# Patient Record
Sex: Female | Born: 1970
Health system: Southern US, Community
[De-identification: ages and names within clinical notes are randomized; demographics above are authoritative.]

## PROBLEM LIST (undated history)

## (undated) DIAGNOSIS — T7840XA Allergy, unspecified, initial encounter: Secondary | ICD-10-CM

## (undated) DIAGNOSIS — G35 Multiple sclerosis: Secondary | ICD-10-CM

## (undated) DIAGNOSIS — T783XXA Angioneurotic edema, initial encounter: Secondary | ICD-10-CM

## (undated) DIAGNOSIS — F419 Anxiety disorder, unspecified: Secondary | ICD-10-CM

## (undated) DIAGNOSIS — F329 Major depressive disorder, single episode, unspecified: Secondary | ICD-10-CM

## (undated) DIAGNOSIS — F32A Depression, unspecified: Secondary | ICD-10-CM

## (undated) DIAGNOSIS — Z8489 Family history of other specified conditions: Secondary | ICD-10-CM

## (undated) DIAGNOSIS — H539 Unspecified visual disturbance: Secondary | ICD-10-CM

## (undated) DIAGNOSIS — G709 Myoneural disorder, unspecified: Secondary | ICD-10-CM

## (undated) DIAGNOSIS — C801 Malignant (primary) neoplasm, unspecified: Secondary | ICD-10-CM

## (undated) HISTORY — DX: Malignant (primary) neoplasm, unspecified: C80.1

## (undated) HISTORY — DX: Depression, unspecified: F32.A

## (undated) HISTORY — PX: ADENOIDECTOMY: SUR15

## (undated) HISTORY — DX: Major depressive disorder, single episode, unspecified: F32.9

## (undated) HISTORY — DX: Anxiety disorder, unspecified: F41.9

## (undated) HISTORY — DX: Unspecified visual disturbance: H53.9

## (undated) HISTORY — DX: Allergy, unspecified, initial encounter: T78.40XA

## (undated) HISTORY — DX: Myoneural disorder, unspecified: G70.9

## (undated) HISTORY — PX: OTHER SURGICAL HISTORY: SHX169

## (undated) HISTORY — DX: Angioneurotic edema, initial encounter: T78.3XXA

## (undated) HISTORY — PX: CHOLECYSTECTOMY: SHX55

## (undated) HISTORY — PX: BUNIONECTOMY: SHX129

---

## 1999-05-05 ENCOUNTER — Emergency Department (HOSPITAL_COMMUNITY): Admission: EM | Admit: 1999-05-05 | Discharge: 1999-05-05 | Payer: Self-pay | Admitting: *Deleted

## 1999-05-29 ENCOUNTER — Inpatient Hospital Stay (HOSPITAL_COMMUNITY): Admission: AD | Admit: 1999-05-29 | Discharge: 1999-05-29 | Payer: Self-pay | Admitting: Obstetrics and Gynecology

## 1999-06-27 ENCOUNTER — Inpatient Hospital Stay (HOSPITAL_COMMUNITY): Admission: AD | Admit: 1999-06-27 | Discharge: 1999-06-30 | Payer: Self-pay | Admitting: Obstetrics and Gynecology

## 1999-08-14 ENCOUNTER — Observation Stay (HOSPITAL_COMMUNITY): Admission: AD | Admit: 1999-08-14 | Discharge: 1999-08-15 | Payer: Self-pay | Admitting: Obstetrics and Gynecology

## 1999-08-15 ENCOUNTER — Encounter: Payer: Self-pay | Admitting: *Deleted

## 1999-08-20 ENCOUNTER — Inpatient Hospital Stay (HOSPITAL_COMMUNITY): Admission: AD | Admit: 1999-08-20 | Discharge: 1999-08-23 | Payer: Self-pay | Admitting: Obstetrics and Gynecology

## 1999-08-20 ENCOUNTER — Encounter: Payer: Self-pay | Admitting: Obstetrics and Gynecology

## 1999-08-21 ENCOUNTER — Encounter: Payer: Self-pay | Admitting: Obstetrics and Gynecology

## 1999-08-23 ENCOUNTER — Encounter: Payer: Self-pay | Admitting: Obstetrics and Gynecology

## 1999-08-29 ENCOUNTER — Ambulatory Visit (HOSPITAL_COMMUNITY): Admission: RE | Admit: 1999-08-29 | Discharge: 1999-08-29 | Payer: Self-pay | Admitting: Obstetrics and Gynecology

## 1999-09-05 ENCOUNTER — Encounter: Payer: Self-pay | Admitting: Obstetrics and Gynecology

## 1999-09-05 ENCOUNTER — Ambulatory Visit (HOSPITAL_COMMUNITY): Admission: RE | Admit: 1999-09-05 | Discharge: 1999-09-05 | Payer: Self-pay | Admitting: Obstetrics and Gynecology

## 1999-09-06 ENCOUNTER — Inpatient Hospital Stay (HOSPITAL_COMMUNITY): Admission: AD | Admit: 1999-09-06 | Discharge: 1999-09-06 | Payer: Self-pay | Admitting: Obstetrics and Gynecology

## 1999-09-13 ENCOUNTER — Ambulatory Visit (HOSPITAL_COMMUNITY): Admission: AD | Admit: 1999-09-13 | Discharge: 1999-09-13 | Payer: Self-pay | Admitting: Obstetrics and Gynecology

## 1999-09-22 ENCOUNTER — Inpatient Hospital Stay (HOSPITAL_COMMUNITY): Admission: AD | Admit: 1999-09-22 | Discharge: 1999-09-25 | Payer: Self-pay | Admitting: Obstetrics and Gynecology

## 1999-09-22 ENCOUNTER — Encounter (INDEPENDENT_AMBULATORY_CARE_PROVIDER_SITE_OTHER): Payer: Self-pay | Admitting: Specialist

## 1999-11-14 ENCOUNTER — Other Ambulatory Visit: Admission: RE | Admit: 1999-11-14 | Discharge: 1999-11-14 | Payer: Self-pay | Admitting: Obstetrics and Gynecology

## 2000-10-14 ENCOUNTER — Encounter: Admission: RE | Admit: 2000-10-14 | Discharge: 2000-11-17 | Payer: Self-pay | Admitting: Orthopaedic Surgery

## 2001-07-21 ENCOUNTER — Other Ambulatory Visit: Admission: RE | Admit: 2001-07-21 | Discharge: 2001-07-21 | Payer: Self-pay | Admitting: Obstetrics and Gynecology

## 2002-04-26 ENCOUNTER — Encounter: Payer: Self-pay | Admitting: Internal Medicine

## 2002-04-26 ENCOUNTER — Ambulatory Visit (HOSPITAL_COMMUNITY): Admission: RE | Admit: 2002-04-26 | Discharge: 2002-04-27 | Payer: Self-pay | Admitting: Neurology

## 2002-04-26 ENCOUNTER — Encounter: Payer: Self-pay | Admitting: Cardiology

## 2003-04-27 ENCOUNTER — Other Ambulatory Visit: Admission: RE | Admit: 2003-04-27 | Discharge: 2003-04-27 | Payer: Self-pay | Admitting: Obstetrics and Gynecology

## 2004-04-30 ENCOUNTER — Other Ambulatory Visit: Admission: RE | Admit: 2004-04-30 | Discharge: 2004-04-30 | Payer: Self-pay | Admitting: Obstetrics and Gynecology

## 2004-06-11 ENCOUNTER — Encounter: Admission: RE | Admit: 2004-06-11 | Discharge: 2004-06-11 | Payer: Self-pay | Admitting: Obstetrics and Gynecology

## 2005-08-25 ENCOUNTER — Other Ambulatory Visit: Admission: RE | Admit: 2005-08-25 | Discharge: 2005-08-25 | Payer: Self-pay | Admitting: Obstetrics and Gynecology

## 2006-11-19 ENCOUNTER — Ambulatory Visit (HOSPITAL_COMMUNITY): Admission: RE | Admit: 2006-11-19 | Discharge: 2006-11-19 | Payer: Self-pay | Admitting: Gastroenterology

## 2006-11-27 ENCOUNTER — Inpatient Hospital Stay (HOSPITAL_COMMUNITY): Admission: EM | Admit: 2006-11-27 | Discharge: 2006-11-28 | Payer: Self-pay | Admitting: Emergency Medicine

## 2006-11-27 ENCOUNTER — Encounter (INDEPENDENT_AMBULATORY_CARE_PROVIDER_SITE_OTHER): Payer: Self-pay | Admitting: *Deleted

## 2007-06-07 ENCOUNTER — Encounter: Admission: RE | Admit: 2007-06-07 | Discharge: 2007-06-07 | Payer: Self-pay | Admitting: Internal Medicine

## 2007-07-29 ENCOUNTER — Encounter: Admission: RE | Admit: 2007-07-29 | Discharge: 2007-07-29 | Payer: Self-pay | Admitting: Obstetrics and Gynecology

## 2007-12-18 ENCOUNTER — Emergency Department (HOSPITAL_COMMUNITY): Admission: EM | Admit: 2007-12-18 | Discharge: 2007-12-18 | Payer: Self-pay | Admitting: Emergency Medicine

## 2007-12-22 ENCOUNTER — Ambulatory Visit (HOSPITAL_COMMUNITY): Admission: RE | Admit: 2007-12-22 | Discharge: 2007-12-22 | Payer: Self-pay | Admitting: Neurology

## 2008-05-10 ENCOUNTER — Ambulatory Visit (HOSPITAL_COMMUNITY): Admission: RE | Admit: 2008-05-10 | Discharge: 2008-05-10 | Payer: Self-pay | Admitting: Neurology

## 2008-05-20 ENCOUNTER — Ambulatory Visit (HOSPITAL_COMMUNITY): Admission: RE | Admit: 2008-05-20 | Discharge: 2008-05-20 | Payer: Self-pay | Admitting: Neurology

## 2008-07-24 ENCOUNTER — Ambulatory Visit (HOSPITAL_COMMUNITY): Admission: RE | Admit: 2008-07-24 | Discharge: 2008-07-24 | Payer: Self-pay | Admitting: Neurology

## 2008-07-25 ENCOUNTER — Encounter: Admission: RE | Admit: 2008-07-25 | Discharge: 2008-07-25 | Payer: Self-pay | Admitting: Neurology

## 2008-07-27 ENCOUNTER — Encounter: Admission: RE | Admit: 2008-07-27 | Discharge: 2008-07-27 | Payer: Self-pay | Admitting: Neurology

## 2008-08-16 ENCOUNTER — Ambulatory Visit (HOSPITAL_COMMUNITY): Admission: RE | Admit: 2008-08-16 | Discharge: 2008-08-16 | Payer: Self-pay | Admitting: Neurology

## 2009-01-01 ENCOUNTER — Ambulatory Visit: Payer: Self-pay | Admitting: Vascular Surgery

## 2009-01-11 ENCOUNTER — Ambulatory Visit: Payer: Self-pay | Admitting: Vascular Surgery

## 2009-09-24 ENCOUNTER — Ambulatory Visit (HOSPITAL_COMMUNITY): Admission: RE | Admit: 2009-09-24 | Discharge: 2009-09-24 | Payer: Self-pay | Admitting: Obstetrics and Gynecology

## 2009-10-23 ENCOUNTER — Emergency Department (HOSPITAL_COMMUNITY): Admission: EM | Admit: 2009-10-23 | Discharge: 2009-10-23 | Payer: Self-pay | Admitting: Family Medicine

## 2010-09-29 ENCOUNTER — Encounter: Payer: Self-pay | Admitting: Gastroenterology

## 2010-09-30 ENCOUNTER — Encounter: Payer: Self-pay | Admitting: Neurology

## 2010-11-24 LAB — CBC
Hemoglobin: 17.1 g/dL — ABNORMAL HIGH (ref 12.0–15.0)
MCHC: 34.3 g/dL (ref 30.0–36.0)
RBC: 5.56 MIL/uL — ABNORMAL HIGH (ref 3.87–5.11)

## 2010-11-24 LAB — PREGNANCY, URINE: Preg Test, Ur: NEGATIVE

## 2011-01-21 NOTE — Consult Note (Signed)
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  This has developed over the last few  years and results in some stinging and burning discomfort, not  associated by any distal edema or history of deep venous thrombosis,  thrombophlebitis, bleeding, etc.  She has not had previous treatment or  evaluation.  She is unable to relieve the discomfort, it is very  transient.   PAST HISTORY:  Negative for diabetes, hypertension, coronary artery  disease, COPD or stroke.  She has had a cardiac arrhythmia treated with  ablation by Dr. Graciela Husbands and is being followed for possible MS by Dr. Melbourne Abts.   PAST SURGICAL HISTORY:  1. Cholecystectomy.  2. Bunionectomy.  3. C-section.   FAMILY HISTORY:  Negative for coronary artery disease, diabetes or  stroke.   SOCIAL HISTORY:  She is single, has 2 children, works as a Engineer, civil (consulting).  Has  not smoked in 20 years.  Does not use alcohol on a regular basis.   ALLERGIES:  Stadol, Demerol, Augmentin, and Neurontin.   PHYSICAL EXAMINATION:  Vital Signs:  Blood pressure 131/84, heart rate  is 81, respirations 14.  General:  A healthy-appearing middle-aged  female in no apparent distress, alert and oriented x3.  Neck:  Supple,  3+ carotid pulses palpable.  No bruits are audible.  Neurologic:  Grossly normal.  No palpable adenopathy in the neck.  Chest:  Clear to  auscultation.  Cardiovascular:  Regular rhythm, no murmurs.  Abdomen:  Soft, nontender with no masses.  Three plus femoral, popliteal,  posterior tibial and dorsalis pedis pulses palpable bilaterally.  She  has some spider veins in the left anterior thigh medially and laterally  and in the left lateral calf.  No varicosities significantly noted  either leg.  There is no distal edema,  hyperpigmentation or ulceration.   Venous duplex exam was performed, revealed no evidence of any deep  venous obstruction or reflux and the left great saphenous vein was  essentially normal with the exception of a very mild amount of reflux at  the junction.   I reassured her regarding these findings, recommended primary  sclerotherapy for her venous disease and we will schedule that in the  near future at her request.   Quita Skye. Hart Rochester, M.D.  Electronically Signed   JDL/MEDQ  D:  01/01/2009  T:  01/02/2009  Job:  [id number]

## 2011-01-21 NOTE — Procedures (Signed)
LOWER EXTREMITY VENOUS REFLUX EXAM   INDICATION:  Left leg telangiectasia.   EXAM:  Using color-flow imaging and pulse Doppler spectral analysis, the  left common femoral, superficial femoral, popliteal, posterior tibial,  greater and lesser saphenous veins are evaluated.  There is evidence  suggesting deep venous insufficiency in the left common femoral vein  only.   The left saphenofemoral junction is mildly incompetent.  The left GSV is  mildly incompetent from the proximal to mid thigh.   The left proximal short saphenous vein demonstrates competency.   GSV Diameter (used if found to be incompetent only)                                            Right    Left  Proximal Greater Saphenous Vein           cm       0.23 cm  Proximal-to-mid-thigh                     cm       0.22 cm  Mid thigh                                 cm       0.21 cm  Mid-distal thigh                          cm    0.20 cm  Distal thigh                              cm       cm  Knee                                      cm       cm   IMPRESSION:  1. Mild left greater saphenous vein reflux is identified with the      caliber ranging from 0.20 cm to 0.23 cm from the groin to the      distal thigh.  2. The left greater saphenous vein is not aneurysmal.  3. The left greater saphenous vein is not tortuous.  4. The deep venous system is mildly incompetent in the left common      femoral vein only.  5. The left lesser saphenous vein is competent.   ___________________________________________  [name] [name]. Hart Rochester, M.D.   MC/MEDQ  D:  01/01/2009  T:  01/01/2009  Job:  [id number]

## 2011-01-24 NOTE — Discharge Summary (Signed)
[facility]  Patient:    [name] [name]                          MRN: [id number] Adm. Date:  [id number] Disc. Date: 06/30/99 Attending:  Frederich Balding Dictator:   Danie Chandler, R.N.                           Discharge Summary  ADMITTING DIAGNOSIS:          Preterm labor at [redacted] weeks gestation.  DISCHARGE DIAGNOSIS:          Preterm labor at [redacted] weeks gestation.  PROCEDURE:                    Magnesium sulfate tocolysis.  REASON FOR ADMISSION:         The patient is a 41 year old gravida 2 para 1 married white female with an estimated date of confinement of October 18, 1999, placing her at approximately 24 weeks estimated gestational age.  The patient was admitted or tocolytics for preterm uterine activity.  The patient was seen in the office for increasing uterine activity, and her cervix was noted to be closed but 50% effaced with a vertex at about a -1 station.  She was sent to triage, where monitoring id reveal increasing uterine irritability, and due to the cervical changes, she was given subcutaneous terbutaline.  There was no resolution of the uterine activity, and thus she was admitted for magnesium sulfate tocolysis.  HOSPITAL COURSE:              The patient is admitted and does receive magnesium sulfate tocolysis.  She is also given IV Unasyn for concern about group B beta strep status, because this is still pending.  On hospitalization day #1, the patient was stable on magnesium sulfate and IV antibiotics, and magnesium sulfate was decreased.  On hospitalization day #2, the patient was having minimal contractions.  She had had good results with the magnesium sulfate, and she was  discharged home on the terbutaline pump.  CONDITION ON DISCHARGE:       Good.  DIET:                         Regular as tolerated.  ACTIVITY:                     Bedrest with bathroom privileges.  INSTRUCTIONS:                 She is to call  for any signs or symptoms of increasing preterm labor.  DISCHARGE MEDICATIONS:        1. Terbutaline pump.                               2. Prenatal vitamin 1 p.o. q.d.  FOLLOW-UP:                    She is to follow up in the office on July 01, 1999. DD:  08/14/99 TD:  08/15/99 Job: [id number]

## 2011-01-24 NOTE — Discharge Summary (Signed)
[name] [name], [name] [name]                ACCOUNT NO.:  [id number]   MEDICAL RECORD NO.:  [id number]          PATIENT TYPE:  INP   LOCATION:  5710                         FACILITY:  [facility]   PHYSICIAN:  Thomas A. Cornett, M.D.DATE OF BIRTH:  1971/03/03   DATE OF ADMISSION:  11/27/2006  DATE OF DISCHARGE:  11/28/2006                               DISCHARGE SUMMARY   ADMITTING DIAGNOSIS:  Biliary dyskinesia.   DISCHARGE DIAGNOSIS:  Biliary dyskinesia.   PROCEDURE PERFORMED:  Laparoscopic cholecystectomy with cholangiogram.   BRIEF HISTORY:  The patient is a 40 year old female that was seen due to  abdominal pain.  She had evidence of biliary dyskinesia.  She was taken  to the operating room on November 27, 2006, for laparoscopic  cholecystectomy with cholangiogram with Dr. [name].  She tolerated this  well.  She is discharged home on November 28, 2006, in improved condition.   DISCHARGE INSTRUCTIONS:  She will follow up with Dr. [name] in one to  two weeks.  She will be given Vicodin for pain as needed.  She will  resume her preoperative medications as before.  Her diet will be as  before.   CONDITION ON DISCHARGE:  Improved.      Thomas A. Cornett, M.D.  Electronically Signed     TAC/MEDQ  D:  01/20/2007  T:  01/20/2007  Job:  [id number]

## 2011-01-24 NOTE — Consult Note (Signed)
NAME:  [name] [name], [name] [name] NO.:  [id number]   MEDICAL RECORD NO.:  [id number]          PATIENT TYPE:  EMS   LOCATION:  MAJO                         FACILITY:  [facility]   PHYSICIAN:  Georgiana Spinner, M.D.    DATE OF BIRTH:  Jul 11, 1971   DATE OF CONSULTATION:  11/26/2006  DATE OF DISCHARGE:                                 CONSULTATION   PHONE CONVERSATION:  Ms. Fryberger called me November 26, 2006 at approximately 6:30 p.m.  She has  been having a problem with her gallbladder diagnosed with a HIDA scan  and an ultrasound and she was referred to Dr. Johna Sheriff.  She states  today she has had the onset of acute abdominal pain and nausea, which is  worse than she has had previously and I suggested that she would  probably do best by going to the emergency room to be evaluated and  possibly see the surgeon there.           ______________________________  Georgiana Spinner, M.D.     GMO/MEDQ  D:  11/26/2006  T:  11/27/2006  Job:  [id number]   cc:   Loreta Ave, M.D.

## 2011-01-24 NOTE — Op Note (Signed)
NAMEPECOLIA, MARANDO                ACCOUNT NO.:  [id number]   MEDICAL RECORD NO.:  [id number]          PATIENT TYPE:  INP   LOCATION:  5710                         FACILITY:  [facility]   PHYSICIAN:  Thornton Park. [name], MD  DATE OF BIRTH:  May 18, 1971   DATE OF PROCEDURE:  11/27/2006  DATE OF DISCHARGE:                               OPERATIVE REPORT   PREOPERATIVE DIAGNOSIS:  Biliary dyskinesia.   POSTOPERATIVE DIAGNOSIS:  Biliary dyskinesia.   FINDINGS:  A large dilated floppy gallbladder with a long cystic duct  and some sludge.   PROCEDURE:  Laparoscopic cholecystectomy with intraoperative  cholangiogram.   SURGEON:  Thornton Park. [name], M.D.   ASSISTANT:  Sandria Bales. Ezzard Standing, M.D.   ANESTHESIA:  General endotracheal anesthesia.   DESCRIPTION OF PROCEDURE:  [name] [name] was taken to room 16 on Friday,  November 27, 2006, and given general anesthesia.  The abdomen was prepped  with TechniCare and draped sterilely.  A longitudinal incision was made  down into her umbilicus and, using Hassan technique, I entered the  abdomen without difficulty.  The abdomen was insufflated and an 11 mm  was placed in the upper midline and two 5 mm placed laterally on the  right.  As we placed these, Marcaine was used.  The gallbladder was  grasped, elevated and the cystic duct and cystic artery were dissected  free and clips were placed proximally on them.  I then did a dynamic  cholangiogram using a Reddick catheter showing a long cystic tortuous  duct with free flow into the common bile duct, free flow into the  duodenum and proximal intrahepatic filling.  The gallbladder was then  triply clipped and divided and the cystic artery was triply clipped and  divided and the gallbladder was removed from the gallbladder bed without  difficulty.  Once It was detached, it was placed in a bag and brought  out through the umbilicus.  Meanwhile, I looked back in the gallbladder  bed, irrigated, and no bleeding or  bile leaks were noted.  The fluid was  withdrawn.  The umbilical port was repaired with a figure-of-eight  suture of 0 Vicryl under laparoscopic vision.  The abdomen was deflated  and the wounds were closed with 4-0 Vicryl and Steri-Strips.  The  patient tolerated the procedure well and was taken to the recovery room  in satisfactory condition.      Thornton Park [name], MD  Electronically Signed     MBM/MEDQ  D:  11/27/2006  T:  11/27/2006  Job:  [id number]   cc:   [name], M.D.

## 2011-01-24 NOTE — Discharge Summary (Signed)
[name], [name] NO.:  [id number]   MEDICAL RECORD NO.:  [id number]                   PATIENT TYPE:  OIB   LOCATION:  3707                                 FACILITY:  [facility]   PHYSICIAN:  Duke Salvia, M.D. [facility]           DATE OF BIRTH:  [birth year]   DATE OF ADMISSION:  04/26/2002  DATE OF DISCHARGE:  04/27/2002                                 DISCHARGE SUMMARY   HISTORY OF PRESENT ILLNESS:  This is a 40 year old female with a 15 year  history of recurrent abrupt onset of tachy palpitations that are associated  with chest pain, shortness of breath and significant lightheadedness.  Pre-  syncope but no syncope, they are both frog negative and diuretic negative  and are become increasingly frequent and increasingly symptomatic over time.  She notices that they are not related to caffeine as she drinks a couple of  glasses a day.  Primarily related to her menstrual status as well as  pregnancy status.  Apparently had an ultrasound of her heart which was  normal.  She has a mitral valve click and a murmur by her report.   PAST MEDICAL HISTORY:  Noted for left bunion surgery, adenoidectomy and C-  section.   ALLERGIES:  Allergies to Demerol, hives to Stadol.  She does tolerate  morphine.   The patient was admitted for SVT ablation.  On 8/19 the patient underwent a  successful flow pathway modification with no evidence of antegrade flow  pathway.  The patient complains of chest pain and vagal after procedure.  A  STAT echo was performed which showed no evidence of pericardial effusion.  The patient was still complaining of intrascapular pain.  A CAT scan of the  chest and abdomen were obtained, which were reportedly negative.  The  patient gradually had decrease in her symptoms and the following day had  felt well and was discharged to home in stable condition on her previous  Zyrtec 10 mg daily.  She is to take coated aspirin 325 daily for 6 weeks  and  antibiotics prior to any urine, dental, bowel or GYN procedures for the next  3 months.  Tylenol 1 to 2 tabs p.r.n. for pain.  No heavy lifting or  strenuous activity for 4 days, no driving for 2 weeks.  A low fat, low  cholesterol diet.  She is to call for any drainage or if she develops a lump  in her groin and she is scheduled to see myself, Chinita Pester, nurse  practitioner, on September 16th at 2:30 PM at the [facility] office.      Chinita Pester, CRNP [facility]                     Duke Salvia, M.D. [facility]    DS/MEDQ  D:  04/27/2002  T:  04/29/2002  Job:  [id number]   cc:  Duke Salvia, M.D. [facility]   Alfred B. Little, M.D.

## 2011-01-24 NOTE — H&P (Signed)
[name], [name] NO.:  [id number]   MEDICAL RECORD NO.:  [id number]          PATIENT TYPE:  EMS   LOCATION:  MAJO                         FACILITY:  [facility]   PHYSICIAN:  Thornton Park. [name], MD  DATE OF BIRTH:  [birth year]   DATE OF ADMISSION:  11/27/2006  DATE OF DISCHARGE:                              HISTORY & PHYSICAL   ATTENDING PHYSICIAN:  Molli Hazard B. [name], MD, with [facility]  surgery.   PRIMARY CARE PHYSICIAN:  [name] [name]. [name] [name], M.D.   GASTROENTEROLOGIST:  [name], M.D.   CHIEF COMPLAINT:  Abdominal pain.   HISTORY OF PRESENT ILLNESS:  The patient is a 40 year old white female  with known gallbladder ejection fraction of 37% on November 19, 2006, who  presented to the emergency department today with one day history of  intractable right upper quadrant and mid epigastric abdominal pain rated  07/10 radiating to right shoulder and associated with nausea and  nonbilious vomiting x1 this morning.  She had been having intermittent  biliary colic for about 10 days and has been evaluated as an outpatient  by Dr. Loreta Ave.  She had an abdominal ultrasound which was normal on November 19, 2006, but had a HIDA scan with a gallbladder ejection fraction of  37% on the same day.  She was referred to see Dr. Johna [name] as an  outpatient but has not had her appointment yet.  She denies any  jaundice, pruritus, chest pain or shortness of breath.  She did have  some subjective fevers last night.   PAST MEDICAL HISTORY:  1. Paroxysmal atrial tachycardia status post ablation in 2003.  2. Mitral valve prolapse.  3. Acne.  4. Depression.   HOME MEDICATIONS:  1. Zyrtec  2. Wellbutrin 150 mg p.o. daily.  3. Solandrine.   ALLERGIES:  DEMEROL, STADOL, and AUGMENTIN all causes serous reaction  with throat swelling.   FAMILY HISTORY:  Mom and dad both with  hypertension.  Dad did have  hernias repaired at [facility].   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  She is divorced and lives with her mother.  She  denies any alcohol, tobacco or other drug use.   PHYSICAL EXAMINATION:  VITALS:  Temperature 97.1, pulse 90, blood  pressure 126/87, respiratory rate 16, 98% on room air.  GENERAL:  She is alert and oriented x3.  HEENT: Mucous membranes are moist.  No scleral icterus.  Pupils are  equally round and reactive to light and accommodation.  Extraocular  movements intact.  NECK:  Supple, no lymphadenopathy.  CARDIOVASCULAR:  Regular rate and rhythm.  LUNGS:  Clear to auscultation bilaterally.  ABDOMEN:  Positive bowel sounds, soft, very tender to palpation in the  right upper quadrant and mid epigastric region.  EXTREMITIES:  No cyanosis, clubbing or edema.  No leg swelling.  SKIN:  No jaundice.   LABORATORY RESULTS:  Currently pending.   ASSESSMENT:  A 40 year old female with known biliary dyskinesia, now  with possible cholecystitis.  PLAN:  Will plan for laparoscopic cholecystectomy later today.  Will the  patient n.p.o.  Will check a CMET, CBC and coag studies.  Will use IV  morphine and IV Zofran as needed for symptom management.  Will start  ciprofloxacin 400 mg IV q.12h. for antibiotic coverage.      [name] [name], M.D.      Thornton Park [name], MD  Electronically Signed    MR/MEDQ  D:  11/27/2006  T:  11/27/2006  Job:  [id number]

## 2011-01-24 NOTE — Discharge Summary (Signed)
[facility]  Patient:    [name] [name]                          MRN: [id number] Adm. Date:  [id number] Disc. Date: [id number] Attending:  Cordelia Pen Ii Dictator:   Kathi Der, R.N.                           Discharge Summary  ADMITTING DIAGNOSES:          1. Intrauterine pregnancy at 36-1/7 weeks estimated                                  gestational age.                               2. Breech presentation.                               3. Oligohydramnios.                               4. Labor.  DISCHARGE DIAGNOSES:          1. Intrauterine pregnancy at 36-1/7 weeks estimated                                  gestational age.                               2. Breech presentation.                               3. Oligohydramnios.                               4. Labor.  PROCEDURE:                    On September 22, 1999, primary low transverse cesarean                               section.  REASON FOR ADMISSION:         The patient is a 40 year old married white female, gravida 2, para 1 at 36-1/[redacted] weeks gestation, whose pregnancy has been complicated by breech presentation and oligohydramnios.  An amniocentesis performed approximately two weeks was immature.  The patient was followed closely with serial examinations, ultrasounds, and stress tests.  The patient also had preterm labor and was on a subcutaneous terbutaline pump for a while, however, the patient does have PAT, and this complicated the terbutaline therapy, and was discontinued prior to this admission.  The patient presented with complaints of uterine contractions. There were no complaints of rupture of membranes or bleeding.  An ultrasound confirmed a breech presentation.  The patient was contracting every three to four minutes with moderate to firm contractions.  There was noted to be a change in he cervical exam.  The diagnosis of  early labor was made.  In view of  the fact that the patient was beyond 36 weeks with oligohydramnios and breech presentation, the recommendation for delivery and cesarean section was made.  HOSPITAL COURSE:              The patient was taken to the operating room and undergoes the above named procedure without complication.  This was productive f a viable female infant with Apgars of 8 at one minute and 9 at five minutes with an  arterial cord pH of 7.30.                                Postoperatively, on day #1, the patients hemoglobin was 12.8, hematocrit 38.1, and white blood cell count 9.0.                                On postoperative day #2, the patient had a good return of bowel function and was tolerating a regular diet, and on postoperative day #3, she was discharged home.  She was ambulating well without difficulty, and had good pain control.  CONDITION ON DISCHARGE:       Good.  DIET:                         Regular as tolerated.  ACTIVITY:                     No heavy lifting, no driving, no vaginal entry.  DISPOSITION:                  She is to follow up in the office in one to two weeks for incision check, and she is to call for temperature greater than 100 degrees, persistent nausea, vomiting, heavy vaginal bleeding, and/or redness or drainage  from the incision site.  DISCHARGE MEDICATIONS:        Prenatal vitamin one p.o. q.d., Darvocet one every four hours as needed for pain. DD:  10/24/99 TD:  10/24/99 Job: [id number]

## 2011-06-10 LAB — OLIGOCLONAL BANDS, CSF + SERM
Albumin Index: 2.4 ratio (ref 0.0–9.0)
Albumin, CSF: 11 mg/dL (ref 0–35)
Albumin, Serum(Neph): 4620 mg/dL (ref 3500–5200)
IgG Index, CSF: 0.56 ratio (ref 0.28–0.66)
IgG, CSF: 1.1 mg/dL (ref 0.0–6.0)
MS CNS IgG Synthesis Rate: <0 mg/d (ref <=8.0)

## 2011-06-10 LAB — CSF CELL COUNT WITH DIFFERENTIAL
Other Cells, CSF: 0
Tube #: 3

## 2011-06-10 LAB — VDRL, CSF: VDRL Quant, CSF: NONREACTIVE

## 2011-06-10 LAB — PROTEIN AND GLUCOSE, CSF: Glucose, CSF: 49

## 2011-06-10 LAB — ANGIOTENSIN CONVERTING ENZYME, CSF: Angio Convert Enzyme: 6 Units (ref <10)

## 2011-10-29 ENCOUNTER — Other Ambulatory Visit: Payer: Self-pay | Admitting: Obstetrics and Gynecology

## 2011-10-29 DIAGNOSIS — R928 Other abnormal and inconclusive findings on diagnostic imaging of breast: Secondary | ICD-10-CM

## 2011-11-06 ENCOUNTER — Ambulatory Visit
Admission: RE | Admit: 2011-11-06 | Discharge: 2011-11-06 | Disposition: A | Discharge status: Home / Self Care | Payer: 59 | Source: Ambulatory Visit | Attending: Obstetrics and Gynecology | Admitting: Obstetrics and Gynecology

## 2011-11-06 DIAGNOSIS — R928 Other abnormal and inconclusive findings on diagnostic imaging of breast: Secondary | ICD-10-CM

## 2012-07-08 ENCOUNTER — Other Ambulatory Visit: Payer: Self-pay | Admitting: Psychiatry

## 2012-07-08 DIAGNOSIS — H469 Unspecified optic neuritis: Secondary | ICD-10-CM

## 2012-07-13 ENCOUNTER — Ambulatory Visit
Admission: RE | Admit: 2012-07-13 | Discharge: 2012-07-13 | Disposition: A | Discharge status: Home / Self Care | Payer: 59 | Source: Ambulatory Visit | Attending: Psychiatry | Admitting: Psychiatry

## 2012-07-13 DIAGNOSIS — H469 Unspecified optic neuritis: Secondary | ICD-10-CM

## 2012-07-13 MED ORDER — GADOBENATE DIMEGLUMINE 529 MG/ML IV SOLN
13.0000 mL | Freq: Once | INTRAVENOUS | Status: AC | PRN
  Administered 2012-07-13: 13 mL via INTRAVENOUS

## 2012-10-11 ENCOUNTER — Ambulatory Visit: Payer: 59 | Attending: Internal Medicine | Admitting: Rehabilitative and Restorative Service Providers"

## 2012-10-11 DIAGNOSIS — R262 Difficulty in walking, not elsewhere classified: Secondary | ICD-10-CM | POA: Insufficient documentation

## 2012-10-11 DIAGNOSIS — IMO0001 Reserved for inherently not codable concepts without codable children: Secondary | ICD-10-CM | POA: Insufficient documentation

## 2012-10-11 DIAGNOSIS — G35 Multiple sclerosis: Secondary | ICD-10-CM | POA: Insufficient documentation

## 2012-10-13 ENCOUNTER — Ambulatory Visit: Payer: 59 | Admitting: Rehabilitative and Restorative Service Providers"

## 2012-10-20 ENCOUNTER — Ambulatory Visit: Payer: 59 | Admitting: Physical Therapy

## 2012-10-22 ENCOUNTER — Ambulatory Visit: Payer: 59 | Admitting: Rehabilitative and Restorative Service Providers"

## 2012-10-27 ENCOUNTER — Ambulatory Visit: Payer: 59 | Admitting: Rehabilitative and Restorative Service Providers"

## 2012-10-29 ENCOUNTER — Ambulatory Visit: Payer: 59 | Admitting: Rehabilitative and Restorative Service Providers"

## 2012-11-03 ENCOUNTER — Ambulatory Visit: Payer: 59 | Admitting: Rehabilitative and Restorative Service Providers"

## 2012-11-05 ENCOUNTER — Ambulatory Visit: Payer: 59 | Admitting: Rehabilitative and Restorative Service Providers"

## 2012-11-10 ENCOUNTER — Ambulatory Visit: Payer: 59 | Attending: Internal Medicine | Admitting: Rehabilitative and Restorative Service Providers"

## 2012-11-10 DIAGNOSIS — G35 Multiple sclerosis: Secondary | ICD-10-CM | POA: Insufficient documentation

## 2012-11-10 DIAGNOSIS — R262 Difficulty in walking, not elsewhere classified: Secondary | ICD-10-CM | POA: Insufficient documentation

## 2012-11-10 DIAGNOSIS — IMO0001 Reserved for inherently not codable concepts without codable children: Secondary | ICD-10-CM | POA: Insufficient documentation

## 2012-11-12 ENCOUNTER — Ambulatory Visit: Payer: 59 | Admitting: Rehabilitative and Restorative Service Providers"

## 2012-11-15 ENCOUNTER — Ambulatory Visit: Payer: 59 | Admitting: Rehabilitative and Restorative Service Providers"

## 2012-11-29 ENCOUNTER — Ambulatory Visit: Payer: 59 | Admitting: Rehabilitative and Restorative Service Providers"

## 2013-01-23 ENCOUNTER — Emergency Department (HOSPITAL_COMMUNITY)
Admission: EM | Admit: 2013-01-23 | Discharge: 2013-01-23 | Disposition: A | Discharge status: Home / Self Care | Payer: 59 | Source: Home / Self Care

## 2013-01-23 ENCOUNTER — Encounter (HOSPITAL_COMMUNITY): Payer: Self-pay | Admitting: Emergency Medicine

## 2013-01-23 DIAGNOSIS — N390 Urinary tract infection, site not specified: Secondary | ICD-10-CM

## 2013-01-23 HISTORY — DX: Multiple sclerosis: G35

## 2013-01-23 LAB — POCT URINALYSIS DIP (DEVICE)
Glucose, UA: NEGATIVE mg/dL
Hgb urine dipstick: NEGATIVE
Nitrite: NEGATIVE
Urobilinogen, UA: 0.2 mg/dL (ref 0.0–1.0)

## 2013-01-23 MED ORDER — CIPROFLOXACIN HCL 500 MG PO TABS: 500.0000 mg | ORAL_TABLET | Freq: Two times a day (BID) | ORAL | Status: DC

## 2013-01-23 NOTE — ED Provider Notes (Signed)
History     CSN: [id number]  Arrival date & time 01/23/13  1122   First MD Initiated Contact with Patient 01/23/13 1205      Chief Complaint  Patient presents with  . Urinary Tract Infection    lower back pain. nausea and vomiting. dysuria and chills    (Consider location/radiation/quality/duration/timing/severity/associated sxs/prior treatment) HPI Comments: 42 year old female complaining of dysuria, fever for 3 days (undocumented) and urinary frequency with normal strength.   Past Medical History  Diagnosis Date  . Multiple sclerosis     Past Surgical History  Procedure Laterality Date  . Cholecystectomy    . Cesarean section      History reviewed. No pertinent family history.  History  Substance Use Topics  . Smoking status: Never Smoker   . Smokeless tobacco: Not on file  . Alcohol Use: Yes    OB History   Grav Para Term Preterm Abortions TAB SAB Ect Mult Living                  Review of Systems  Constitutional: Positive for fever.  HENT: Negative.   Respiratory: Negative.   Gastrointestinal: Negative.   Genitourinary: Positive for dysuria and frequency. Negative for hematuria, vaginal bleeding, vaginal discharge, menstrual problem and pelvic pain.  Neurological: Negative.     Allergies  Augmentin; Demerol; Estrogens; and Stadol  Home Medications   Current Outpatient Rx  Name  Route  Sig  Dispense  Refill  . ciprofloxacin (CIPRO) 500 MG tablet   Oral   Take 1 tablet (500 mg total) by mouth 2 (two) times daily.   14 tablet   0     BP 117/83  Pulse 73  Resp 18  SpO2 100%  LMP 01/21/2013  Physical Exam  Nursing note and vitals reviewed. Constitutional: She appears well-developed. No distress.  Neck: Neck supple.  Cardiovascular: Normal heart sounds.   Pulmonary/Chest: Effort normal.  Musculoskeletal: She exhibits no edema.  Neurological: She is alert.  Skin: Skin is warm and dry.  Psychiatric: She has a normal mood and affect.     ED Course  Procedures (including critical care time)  Labs Reviewed  URINE CULTURE  POCT URINALYSIS DIP (DEVICE)   No results found.  Results for orders placed during the hospital encounter of 01/23/13  POCT URINALYSIS DIP (DEVICE)      Result Value Range   Glucose, UA NEGATIVE  NEGATIVE mg/dL   Bilirubin Urine NEGATIVE  NEGATIVE   Ketones, ur NEGATIVE  NEGATIVE mg/dL   Specific Gravity, Urine 1.015  1.005 - 1.030   Hgb urine dipstick NEGATIVE  NEGATIVE   pH 7.0  5.0 - 8.0   Protein, ur NEGATIVE  NEGATIVE mg/dL   Urobilinogen, UA 0.2  0.0 - 1.0 mg/dL   Nitrite NEGATIVE  NEGATIVE   Leukocytes, UA NEGATIVE  NEGATIVE      1. UTI (lower urinary tract infection)       MDM  Despite a normal urinalysis the patient has constant dysuria, malaise and urinary frequency. She is allergic to penicillins and is uncertain about cephalosporins. We will treat with Cipro 500 twice a day for 7 days which she has been treated before. Drink plenty of fluids, stay well-hydrated May use AZOwhen necessary urinary symptoms. Any new symptoms problems worsening may return        Hayden Rasmussen, NP 01/23/13 1230

## 2013-01-23 NOTE — ED Notes (Signed)
Pt c/o dysuria, frequency, lower back pain, chills, nausea and vomiting. Pt states symptoms started 3 days ago. Pt has increased fluids and tylenol for pain relief. Pt denies any pelvic or abdominal pain.

## 2013-01-23 NOTE — ED Provider Notes (Signed)
Medical screening examination/treatment/procedure(s) were performed by resident physician or non-physician practitioner and as supervising physician I was immediately available for consultation/collaboration.   Falicia Lizotte DOUGLAS MD.   Brighten Orndoff D Abanoub Hanken, MD 01/23/13 1718 

## 2013-01-24 LAB — URINE CULTURE: Special Requests: NORMAL

## 2013-03-03 ENCOUNTER — Encounter (HOSPITAL_COMMUNITY): Payer: Self-pay | Admitting: Emergency Medicine

## 2013-03-03 ENCOUNTER — Emergency Department (HOSPITAL_COMMUNITY)
Admission: EM | Admit: 2013-03-03 | Discharge: 2013-03-03 | Disposition: A | Discharge status: Home / Self Care | Payer: 59 | Source: Home / Self Care | Attending: Family Medicine | Admitting: Family Medicine

## 2013-03-03 ENCOUNTER — Emergency Department (INDEPENDENT_AMBULATORY_CARE_PROVIDER_SITE_OTHER): Payer: 59

## 2013-03-03 DIAGNOSIS — M7750 Other enthesopathy of unspecified foot: Secondary | ICD-10-CM

## 2013-03-03 DIAGNOSIS — M7741 Metatarsalgia, right foot: Secondary | ICD-10-CM

## 2013-03-03 MED ORDER — IBUPROFEN 800 MG PO TABS
ORAL_TABLET | ORAL | Status: AC
  Filled 2013-03-03: qty 1

## 2013-03-03 MED ORDER — IBUPROFEN 800 MG PO TABS
800.0000 mg | ORAL_TABLET | Freq: Once | ORAL | Status: AC
  Administered 2013-03-03: 800 mg via ORAL

## 2013-03-03 MED ORDER — TRAMADOL HCL 50 MG PO TABS: 50.0000 mg | ORAL_TABLET | Freq: Four times a day (QID) | ORAL | Status: DC | PRN

## 2013-03-03 MED ORDER — IBUPROFEN 600 MG PO TABS: 600.0000 mg | ORAL_TABLET | Freq: Three times a day (TID) | ORAL | Status: DC | PRN

## 2013-03-03 NOTE — ED Notes (Signed)
Patient reports mowing several days ago.  Patient reports falling while mowing.  Patient remembers falling forward and to the right.  Initially did not notice any pain.  2 days later patient noticed pain in right foot.  Reports increasing pain since then to the point of using a cane last night and today.  No ankle pain.  No visible injury.  Patient feels foot is swollen.  Patient has tried rest and icing.

## 2013-03-03 NOTE — ED Notes (Signed)
Provided sprite and saltines

## 2013-03-03 NOTE — ED Notes (Signed)
Patient returned to tretment room from xray

## 2013-03-05 NOTE — ED Provider Notes (Signed)
History    CSN: [id number] Arrival date & time 03/03/13  1023  First MD Initiated Contact with Patient 03/03/13 1106     Chief Complaint  Patient presents with  . Foot Pain   (Consider location/radiation/quality/duration/timing/severity/associated sxs/prior Treatment) HPI Comments: 42 year old female nonsmoker with history of MS and here right foot pain  for about 3 days, has tried rest and denies and took ibuprofen through the day just today with some improvement but pain was worse this morning at wake up to the point of patient having to wear her cane today. Points to pain in the lateral right food and mid metatarsal area worse with putting weight on her right foot. Right lower extremity is dominant. Patient remembers mowing the lawn few days before her pain started and falling to worse her right side but did not notice any foot pain or ankle pain during or in the next 2 days following this incident. Denies ankle pain or swelling currently or recently.  Past Medical History  Diagnosis Date  . Multiple sclerosis    Past Surgical History  Procedure Laterality Date  . Cholecystectomy    . Cesarean section     No family history on file. History  Substance Use Topics  . Smoking status: Never Smoker   . Smokeless tobacco: Not on file  . Alcohol Use: Yes   OB History   Grav Para Term Preterm Abortions TAB SAB Ect Mult Living                 Review of Systems No fatigue, general malaise, chills or fever. No numbness or paresthesias. Pain in right foot as per HPI.  No skin ulcers, no redness.   Allergies  Augmentin; Demerol; Estrogens; and Stadol  Home Medications   Current Outpatient Rx  Name  Route  Sig  Dispense  Refill  . ALPRAZolam (XANAX) 0.25 MG tablet   Oral   Take 0.25 mg by mouth at bedtime as needed for sleep.         Marland Kitchen DOXEPIN HCL PO   Oral   Take by mouth.         . methylphenidate (RITALIN) 10 MG tablet   Oral   Take 10 mg by mouth 2 (two) times  daily.         Marland Kitchen ibuprofen (ADVIL,MOTRIN) 600 MG tablet   Oral   Take 1 tablet (600 mg total) by mouth every 8 (eight) hours as needed for pain. Take with food   30 tablet   0   . traMADol (ULTRAM) 50 MG tablet   Oral   Take 1 tablet (50 mg total) by mouth every 6 (six) hours as needed for pain.   28 tablet   0    BP 132/88  Pulse 65  Temp(Src) 98.2 F (36.8 C) (Oral)  Resp 16  SpO2 98%  LMP 02/25/2013 Physical Exam General: Well developed, sitting in chair in NAD. HEENT: atraumatic, normocephalic. Musculoskeletal: Right foot: hallux valgus deformity of the 1st MPJ. No ankle swelling, ankle with FROM and no perimalleolar tenderness. There is focal tenderness over the mid MPJ areas in the sole. No calluses felt. Foot perfusion appears normal with good color and normal temperature. Dorsal pdal and tibial posterior pulses are patent. Superficial sensation appears intact. Skin: no skin brakes or ulcers, no bruises or erythema. Psych: Normal mood and affect.   ED Course  Procedures (including critical care time) Labs Reviewed - No data to display Dg Foot Complete  Right  03/03/2013   *RADIOLOGY REPORT*  Clinical Data: Fall.  Pain.  RIGHT FOOT COMPLETE - 3+ VIEW  Comparison: 06/07/2007.  Findings: No fracture or dislocation.  Hallux valgus deformity once again noted.  Tiny plantar spur.  IMPRESSION: No fracture or dislocation.  Hallux valgus deformity once again noted.  Tiny plantar spur.   Original Report Authenticated By: Lacy Duverney, M.D.   1. Metatarsalgia of right foot     MDM  Treated with rigid soled shoe. Ibuprofen and tramadol. Reccommended metatarsal inserts over the counter. Orthopedic referral for follow up as needed.    Sharin Grave, MD 03/05/13 [phone]

## 2013-08-01 ENCOUNTER — Telehealth: Payer: Self-pay | Admitting: Cardiovascular Disease

## 2013-08-01 NOTE — Telephone Encounter (Signed)
New Problem  Pt had chest pain and some arhythmia this weekend and wanted to speak to someone about it please give pt call.

## 2013-08-02 NOTE — Telephone Encounter (Signed)
Advised patient to contact PCP and if they feel a referral to cardiology is needed then we will be more than happy to see her. Explained that because she has never been seen in our office, and only a procedure by Dr Graciela Husbands in the hospital back in 2003 - we would need referral to advise her further. Patient verbalized understanding and agreeable to plan.  Doylene Bode, clinical services manager, was updated with this information.

## 2013-08-25 ENCOUNTER — Other Ambulatory Visit: Payer: Self-pay | Admitting: Psychiatry

## 2013-08-25 DIAGNOSIS — G35 Multiple sclerosis: Secondary | ICD-10-CM

## 2013-08-29 ENCOUNTER — Ambulatory Visit
Admission: RE | Admit: 2013-08-29 | Discharge: 2013-08-29 | Disposition: A | Discharge status: Home / Self Care | Payer: 59 | Source: Ambulatory Visit | Attending: Psychiatry | Admitting: Psychiatry

## 2013-08-29 DIAGNOSIS — G35 Multiple sclerosis: Secondary | ICD-10-CM

## 2013-08-29 MED ORDER — GADOBENATE DIMEGLUMINE 529 MG/ML IV SOLN
13.0000 mL | Freq: Once | INTRAVENOUS | Status: AC | PRN
  Administered 2013-08-29: 13 mL via INTRAVENOUS

## 2013-10-10 ENCOUNTER — Other Ambulatory Visit: Payer: Self-pay | Admitting: Internal Medicine

## 2013-10-10 ENCOUNTER — Ambulatory Visit (HOSPITAL_COMMUNITY)
Admission: RE | Admit: 2013-10-10 | Discharge: 2013-10-10 | Disposition: A | Discharge status: Home / Self Care | Payer: 59 | Source: Ambulatory Visit | Attending: Internal Medicine | Admitting: Internal Medicine

## 2013-10-10 DIAGNOSIS — S62309A Unspecified fracture of unspecified metacarpal bone, initial encounter for closed fracture: Secondary | ICD-10-CM | POA: Insufficient documentation

## 2013-10-10 DIAGNOSIS — R52 Pain, unspecified: Secondary | ICD-10-CM

## 2013-10-10 DIAGNOSIS — M25539 Pain in unspecified wrist: Secondary | ICD-10-CM | POA: Insufficient documentation

## 2013-10-10 DIAGNOSIS — X58XXXA Exposure to other specified factors, initial encounter: Secondary | ICD-10-CM | POA: Insufficient documentation

## 2014-03-27 ENCOUNTER — Encounter (HOSPITAL_COMMUNITY): Payer: Self-pay | Admitting: Emergency Medicine

## 2014-03-27 ENCOUNTER — Telehealth (HOSPITAL_COMMUNITY): Payer: Self-pay

## 2014-03-27 ENCOUNTER — Ambulatory Visit (HOSPITAL_COMMUNITY)
Admission: RE | Admit: 2014-03-27 | Discharge: 2014-03-27 | Disposition: A | Discharge status: Home / Self Care | Payer: 59 | Source: Ambulatory Visit | Attending: Emergency Medicine | Admitting: Emergency Medicine

## 2014-03-27 ENCOUNTER — Emergency Department (HOSPITAL_COMMUNITY)
Admission: EM | Admit: 2014-03-27 | Discharge: 2014-03-27 | Disposition: A | Discharge status: Home / Self Care | Payer: 59 | Attending: Emergency Medicine | Admitting: Emergency Medicine

## 2014-03-27 DIAGNOSIS — S99929A Unspecified injury of unspecified foot, initial encounter: Principal | ICD-10-CM

## 2014-03-27 DIAGNOSIS — Y939 Activity, unspecified: Secondary | ICD-10-CM | POA: Insufficient documentation

## 2014-03-27 DIAGNOSIS — Y929 Unspecified place or not applicable: Secondary | ICD-10-CM | POA: Insufficient documentation

## 2014-03-27 DIAGNOSIS — M79609 Pain in unspecified limb: Secondary | ICD-10-CM | POA: Insufficient documentation

## 2014-03-27 DIAGNOSIS — M7989 Other specified soft tissue disorders: Secondary | ICD-10-CM

## 2014-03-27 DIAGNOSIS — Z79899 Other long term (current) drug therapy: Secondary | ICD-10-CM | POA: Insufficient documentation

## 2014-03-27 DIAGNOSIS — M79605 Pain in left leg: Secondary | ICD-10-CM

## 2014-03-27 DIAGNOSIS — S8990XA Unspecified injury of unspecified lower leg, initial encounter: Secondary | ICD-10-CM | POA: Insufficient documentation

## 2014-03-27 DIAGNOSIS — W19XXXA Unspecified fall, initial encounter: Secondary | ICD-10-CM | POA: Insufficient documentation

## 2014-03-27 DIAGNOSIS — S99919A Unspecified injury of unspecified ankle, initial encounter: Principal | ICD-10-CM

## 2014-03-27 DIAGNOSIS — Z8669 Personal history of other diseases of the nervous system and sense organs: Secondary | ICD-10-CM | POA: Insufficient documentation

## 2014-03-27 MED ORDER — ENOXAPARIN SODIUM 60 MG/0.6ML ~~LOC~~ SOLN
60.0000 mg | Freq: Once | SUBCUTANEOUS | Status: AC
  Administered 2014-03-27: 60 mg via SUBCUTANEOUS
  Filled 2014-03-27: qty 0.6

## 2014-03-27 NOTE — Progress Notes (Signed)
VASCULAR LAB PRELIMINARY  PRELIMINARY  PRELIMINARY  PRELIMINARY  Left lower extremity venous duplex completed.    Preliminary report:  Left:  No evidence of DVT, superficial thrombosis, or Baker's cyst.  Jash Wahlen, RVS 03/27/2014, 9:37 AM

## 2014-03-27 NOTE — ED Provider Notes (Signed)
CSN: [id number]     Arrival date & time 03/27/14  0414 History   First MD Initiated Contact with Patient 03/27/14 0451     Chief Complaint  Patient presents with  . Leg Pain     (Consider location/radiation/quality/duration/timing/severity/associated sxs/prior Treatment) HPI   Ms. Hefner is a very pleasant 43 yo Se[place] with a history of multiple sclerosis. She fell and sprained her left ankle 3 weeks ago and has had some residual discomfort. She has had some lingering left lateral ankle swelling and pain since the injury occurred.   However, she awoke from sleep at 0300h today with severe, aching and cramping pain in the left calf region. She feels her left lower leg is larger in diameter when compared to the right. She notes that she had involuntary plantarflexion of the left foot when she awoke with pain but was able to gradually extend to a neutral position and ambulate. She has no history of similar sx.   She has been on and off of a couple of different methods of estrogen hormone replacement therapy for the past few weeks. She has no history of VTE and has not experienced SOB or chest pain.   For the most part, her severe discomfort has resolved. She notes that her left leg is chronically slightly weaker than the right secondary to MS. She has not noticed any change as far as this goes. No new paresthesias or motor weakness.   Pain was severe at it's worst, non radiating, aching and cramping in nature.   Past Medical History  Diagnosis Date  . Multiple sclerosis    Past Surgical History  Procedure Laterality Date  . Cholecystectomy    . Cesarean section    . Bunionectomy Left     foot   History reviewed. No pertinent family history. History  Substance Use Topics  . Smoking status: Never Smoker   . Smokeless tobacco: Not on file  . Alcohol Use: Yes   OB History   Grav Para Term Preterm Abortions TAB SAB Ect Mult Living                 Review of  Systems Ten point review of symptoms performed and is negative with the exception of symptoms noted above.    Allergies  Augmentin; Demerol; Estrogens; and Stadol  Home Medications   Prior to Admission medications   Medication Sig Start Date End Date Taking? Authorizing Provider  cetirizine (ZYRTEC) 10 MG tablet Take 10 mg by mouth daily.   Yes Historical Provider, MD  hydrOXYzine (ATARAX/VISTARIL) 25 MG tablet Take 25 mg by mouth at bedtime.   Yes Historical Provider, MD  methylphenidate (RITALIN) 10 MG tablet Take 10 mg by mouth 2 (two) times daily.   Yes Historical Provider, MD   BP 133/78  Pulse 79  Temp(Src) 98.5 F (36.9 C) (Oral)  Resp 16  Ht 5\' 5"  (1.651 m)  Wt 140 lb (63.504 kg)  BMI 23.30 kg/m2  SpO2 98%  LMP 01/25/2014 Physical Exam Gen: well developed and well nourished appearing Head: NCAT Eyes: PERL, EOMI Nose: Normal to inspection Neck: Normal to inspection Lungs: CTA B, no wheezing, rhonchi or rales CV: RRR, no murmur, extremities appear well perfused.  Abd: soft, notender, nondistended Skin: warm and dry Ext: normal to inspection, left calf tenderness to palpation with positive Hohman's sign. Strong and symmetric DP pulses, no dependent edema Neuro: 5/5 dorsi and plantar flexion right ankle, 4+/5 left ankle dorsi and plantar  flexion. Cap refill < 2s all toes bilaterally.  Psyche; normal affect,  calm and cooperative.  ED Course  Procedures (including critical care time) Labs Review   MDM   Left lower leg pain with history of trauma (ankle sprain) 3 weeks ago and HRT makes the patient and increased risk of DVT. We will treat empirically with Lovenox 1mg /kg [place]. Vascular u/s ordered and we discussed  Need to return later in the morning for the procedure.    Final diagnoses:  Leg pain, inferior, left        Elyn Peers, MD 03/27/14 [phone]

## 2014-03-27 NOTE — ED Notes (Signed)
Patient complaint of left lower leg pain on the posterior left leg just distal to the knee.  Patient states pain woke her from sleep last night but is a little better now.  Patient also reports that she twisted her left ankle approximately 3 weeks ago.

## 2014-03-27 NOTE — ED Notes (Signed)
Pt A&OX4, ambulatory at d/c with steady gait, NAD 

## 2014-04-28 ENCOUNTER — Other Ambulatory Visit: Payer: Self-pay | Admitting: Obstetrics and Gynecology

## 2014-05-01 LAB — CYTOLOGY - PAP

## 2014-07-27 ENCOUNTER — Other Ambulatory Visit (HOSPITAL_COMMUNITY): Payer: Self-pay | Admitting: Psychiatry

## 2014-07-27 DIAGNOSIS — G35 Multiple sclerosis: Secondary | ICD-10-CM

## 2014-08-14 ENCOUNTER — Ambulatory Visit (HOSPITAL_COMMUNITY)
Admission: RE | Admit: 2014-08-14 | Discharge: 2014-08-14 | Disposition: A | Discharge status: Home / Self Care | Payer: 59 | Source: Ambulatory Visit | Attending: Psychiatry | Admitting: Psychiatry

## 2014-08-14 DIAGNOSIS — R262 Difficulty in walking, not elsewhere classified: Secondary | ICD-10-CM | POA: Insufficient documentation

## 2014-08-14 DIAGNOSIS — R200 Anesthesia of skin: Secondary | ICD-10-CM | POA: Diagnosis not present

## 2014-08-14 DIAGNOSIS — M8938 Hypertrophy of bone, other site: Secondary | ICD-10-CM | POA: Diagnosis not present

## 2014-08-14 DIAGNOSIS — R4702 Dysphasia: Secondary | ICD-10-CM | POA: Insufficient documentation

## 2014-08-14 DIAGNOSIS — H539 Unspecified visual disturbance: Secondary | ICD-10-CM | POA: Diagnosis not present

## 2014-08-14 DIAGNOSIS — J341 Cyst and mucocele of nose and nasal sinus: Secondary | ICD-10-CM | POA: Insufficient documentation

## 2014-08-14 DIAGNOSIS — M4802 Spinal stenosis, cervical region: Secondary | ICD-10-CM | POA: Insufficient documentation

## 2014-08-14 DIAGNOSIS — G35 Multiple sclerosis: Secondary | ICD-10-CM | POA: Diagnosis not present

## 2014-08-14 MED ORDER — GADOBENATE DIMEGLUMINE 529 MG/ML IV SOLN
15.0000 mL | Freq: Once | INTRAVENOUS | Status: AC | PRN
  Administered 2014-08-14: 13 mL via INTRAVENOUS

## 2014-08-14 MED ORDER — GADOPENTETATE DIMEGLUMINE 469.01 MG/ML IV SOLN: 15.0000 mL | Freq: Once | INTRAVENOUS | Status: AC | PRN

## 2014-10-13 ENCOUNTER — Other Ambulatory Visit: Payer: Self-pay | Admitting: Dermatology

## 2015-07-16 ENCOUNTER — Ambulatory Visit (INDEPENDENT_AMBULATORY_CARE_PROVIDER_SITE_OTHER): Payer: 59 | Admitting: Internal Medicine

## 2015-07-16 ENCOUNTER — Encounter: Payer: Self-pay | Admitting: Internal Medicine

## 2015-07-16 VITALS — BP 112/80 | HR 76 | Ht 64.0 in | Wt 149.2 lb

## 2015-07-16 DIAGNOSIS — R194 Change in bowel habit: Secondary | ICD-10-CM | POA: Diagnosis not present

## 2015-07-16 DIAGNOSIS — K625 Hemorrhage of anus and rectum: Secondary | ICD-10-CM | POA: Diagnosis not present

## 2015-07-16 DIAGNOSIS — R1032 Left lower quadrant pain: Secondary | ICD-10-CM

## 2015-07-16 DIAGNOSIS — R1084 Generalized abdominal pain: Secondary | ICD-10-CM | POA: Diagnosis not present

## 2015-07-16 MED ORDER — NA SULFATE-K SULFATE-MG SULF 17.5-3.13-1.6 GM/177ML PO SOLN: 1.0000 | Freq: Once | ORAL | Status: DC

## 2015-07-16 NOTE — Progress Notes (Signed)
HISTORY OF PRESENT ILLNESS:  [name] [name] is a 44 y.o. 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 Inda Merlin, for evaluation of change in bowel habits and abdominal pain. The patient reports being in her usual state of health until proximal to one month ago when she abruptly developed constipation. She required laxative assistance to promote bowel movements. She describes her stools as thin, occasionally dark, and occasionally floating. There was associated left lower quadrant abdominal pain. Approximately one week ago, she was evaluated by Dr. Inda Merlin. He thought that she might have diverticulitis for which metronidazole and ciprofloxacin were prescribed. Since initiating antibiotics, she reports significant improvement in her condition. She will complete a one-week course of therapy today. Bowel habits have improved, though not baseline. Abdominal discomfort has improved. She denies antecedent change in appetite or weight. She does have occasional belching and bloating. Rare reflux symptoms. She did notice some red blood on the tissue while constipated. Her father has a history of diverticulitis. Sister has a history of ulcerative colitis. She did have a CBC and comprehensive metabolic panel performed last week (these have been requested for review). She is status post remote cholecystectomy and has a history of multiple sclerosis which is currently quiescent  REVIEW OF SYSTEMS:  All non-GI ROS negative except for headaches, itching  Past Medical History  Diagnosis Date  . Multiple sclerosis [facility])     Past Surgical History  Procedure Laterality Date  . Cholecystectomy    . Cesarean section    . Bunionectomy Left     foot    Social History [name] [name]  reports that she has never smoked. She has never used smokeless tobacco. She reports that she drinks alcohol. She reports that she does not use illicit drugs.  family  history includes Cervical cancer in her mother; Melanoma in her father.  Allergies  Allergen Reactions  . Augmentin [Amoxicillin-Pot Clavulanate] Swelling    tongue swelling  . Venlafaxine Hives    blisters  . Demerol [Meperidine] Rash  . Estrogens Rash    The patient can use patch,No oral  . Gabapentin Rash  . Stadol [Butorphanol] Rash       PHYSICAL EXAMINATION: Vital signs: BP 112/80 mmHg  Pulse 76  Ht 5\' 4"  (1.626 m)  Wt 149 lb 3.2 oz (67.677 kg)  BMI 25.60 kg/m2  LMP 07/13/2015 (Exact Date)  Constitutional: generally well-appearing, no acute distress Psychiatric: alert and oriented x3, cooperative Eyes: extraocular movements intact, anicteric, conjunctiva pink Mouth: oral pharynx moist, no lesions Neck: supple without thyromegaly Lymph: no lymphadenopathy Cardiovascular: heart regular rate and rhythm, no murmur Lungs: clear to auscultation bilaterally Abdomen: soft, with tenderness in the left lower quadrant to palpation, no mass, nondistended, no obvious ascites, no peritoneal signs, normal bowel sounds, no organomegaly Rectal: Deferred until colonoscopy Extremities: no clubbing cyanosis or lower extremity edema bilaterally Skin: no lesions on visible extremities Neuro: No focal deficits. Cranial nerves intact  ASSESSMENT:  #1. Abrupt change in bowel habits and left lower quadrant pain as well as minor bleeding as described. Rule out diverticulitis. Rule out neoplasia  PLAN:  #1. Complete course of antibiotics #2. Schedule colonoscopy to evaluate. The nature of the procedure, as well as the risks, benefits, and alternatives were carefully and thoroughly reviewed with the patient. Ample time for discussion and questions allowed. The patient understood, was satisfied, and agreed to proceed.  A copy of this consultation note  has been sent to Dr. Inda Merlin

## 2015-07-16 NOTE — Patient Instructions (Signed)

## 2015-07-26 ENCOUNTER — Ambulatory Visit (AMBULATORY_SURGERY_CENTER): Payer: 59 | Admitting: Internal Medicine

## 2015-07-26 ENCOUNTER — Encounter: Payer: Self-pay | Admitting: Internal Medicine

## 2015-07-26 VITALS — BP 103/71 | HR 62 | Temp 98.1°F | Resp 17 | Ht 64.0 in | Wt 149.0 lb

## 2015-07-26 DIAGNOSIS — R1032 Left lower quadrant pain: Secondary | ICD-10-CM

## 2015-07-26 DIAGNOSIS — K5901 Slow transit constipation: Secondary | ICD-10-CM | POA: Diagnosis not present

## 2015-07-26 DIAGNOSIS — K625 Hemorrhage of anus and rectum: Secondary | ICD-10-CM

## 2015-07-26 DIAGNOSIS — R194 Change in bowel habit: Secondary | ICD-10-CM

## 2015-07-26 MED ORDER — SODIUM CHLORIDE 0.9 % IV SOLN: 500.0000 mL | INTRAVENOUS | Status: DC

## 2015-07-26 NOTE — Progress Notes (Signed)
A/ox3 pleased with MAC, report to Jane RN 

## 2015-07-26 NOTE — Op Note (Signed)
[place]  [address] [place], [id number]   COLONOSCOPY PROCEDURE REPORT  PATIENT: [name] [name], [name] [name]  MR#: [id number] BIRTHDATE: 10/08/1970 , 71  yrs. old GENDER: female ENDOSCOPIST: Eustace Quail, MD REFERRED BY:R.  Marcellus Scott, M.D. PROCEDURE DATE:  07/26/2015 PROCEDURE:   Colonoscopy, diagnostic First Screening Colonoscopy - Avg.  risk and is 50 yrs.  old or older - No.  Prior Negative Screening - Now for repeat screening. N/A  History of Adenoma - Now for follow-up colonoscopy & has been > or = to 3 yrs.  N/A  Polyps removed today? No Recommend repeat exam, <10 yrs? No ASA CLASS:   Class I INDICATIONS:change in bowel habits, constipation, and abdominal pain in the lower right quadrant(empirically treated for diverticulitis), minor rectal bleeding. . Problems with pain have resolved since office visit. Still with constipation. MEDICATIONS: Monitored anesthesia care and Propofol 250 mg IV  DESCRIPTION OF PROCEDURE:   After the risks benefits and alternatives of the procedure were thoroughly explained, informed consent was obtained.  The digital rectal exam revealed no abnormalities of the rectum.   The LB [id number]  endoscope was introduced through the anus and advanced to the cecum, which was identified by both the appendix and ileocecal valve. No adverse events experienced.   The quality of the prep was excellent. (Suprep was used)  The instrument was then slowly withdrawn as the colon was fully examined. Estimated blood loss is zero unless otherwise noted in this procedure report.  COLON FINDINGS: The examined terminal ileum appeared to be normal. A normal appearing cecum, ileocecal valve, and appendiceal orifice were identified.  The ascending, transverse, descending, sigmoid colon, and rectum appeared unremarkable.  Retroflexed views revealed internal hemorrhoids. The time to cecum = 2.4 Withdrawal time = 10.1   The scope was withdrawn and  the procedure completed. COMPLICATIONS: There were no immediate complications.  ENDOSCOPIC IMPRESSION: 1.   The examined terminal ileum appeared normal 2.   Normal colonoscopy . No diverticulosis  RECOMMENDATIONS: 1.  High fiber diet, or daily fiber supplement. Increase water intake. 2.  Continue current colorectal screening recommendations for "routine risk" patients with a repeat colonoscopy in 10 years. 3. Return to the care of your primary provider. GI follow-up as needed  eSigned:  Eustace Quail, MD 07/26/2015 10:45 AM   cc: The Patient and R.  Marcellus Scott, MD

## 2015-07-26 NOTE — Patient Instructions (Signed)
YOU HAD AN ENDOSCOPIC PROCEDURE TODAY AT [facility]:   Refer to the procedure report that was given to you for any specific questions about what was found during the examination.  If the procedure report does not answer your questions, please call your gastroenterologist to clarify.  If you requested that your care partner not be given the details of your procedure findings, then the procedure report has been included in a sealed envelope for you to review at your convenience later.  YOU SHOULD EXPECT: Some feelings of bloating in the abdomen. Passage of more gas than usual.  Walking can help get rid of the air that was put into your GI tract during the procedure and reduce the bloating. If you had a lower endoscopy (such as a colonoscopy or flexible sigmoidoscopy) you may notice spotting of blood in your stool or on the toilet paper. If you underwent a bowel prep for your procedure, you may not have a normal bowel movement for a few days.  Please Note:  You might notice some irritation and congestion in your nose or some drainage.  This is from the oxygen used during your procedure.  There is no need for concern and it should clear up in a day or so.  SYMPTOMS TO REPORT IMMEDIATELY:   Following lower endoscopy (colonoscopy or flexible sigmoidoscopy):  Excessive amounts of blood in the stool  Significant tenderness or worsening of abdominal pains  Swelling of the abdomen that is new, acute  Fever of 100F or high    For urgent or emergent issues, a gastroenterologist can be reached at any hour by calling [phone].   DIET: Your first meal following the procedure should be a small meal and then it is ok to progress to your normal diet. Heavy or fried foods are harder to digest and may make you feel nauseous or bloated.  Likewise, meals heavy in dairy and vegetables can increase bloating.  Drink plenty of fluids but you should avoid alcoholic beverages for 24  hours.  ACTIVITY:  You should plan to take it easy for the rest of today and you should NOT DRIVE or use heavy machinery until tomorrow (because of the sedation medicines used during the test).    FOLLOW UP: Our staff will call the number listed on your records the next business day following your procedure to check on you and address any questions or concerns that you may have regarding the information given to you following your procedure. If we do not reach you, we will leave a message.  However, if you are feeling well and you are not experiencing any problems, there is no need to return our call.  We will assume that you have returned to your regular daily activities without incident.  If any biopsies were taken you will be contacted by phone or by letter within the next 1-3 weeks.  Please call us at [phone] if you have not heard about the biopsies in 3 weeks.    SIGNATURES/CONFIDENTIALITY: You and/or your care partner have signed paperwork which will be entered into your electronic medical record.  These signatures attest to the fact that that the information above on your After Visit Summary has been reviewed and is understood.  Full responsibility of the confidentiality of this discharge information lies with you and/or your care-partner.  High fiber diet information given.

## 2015-07-27 ENCOUNTER — Telehealth: Payer: Self-pay | Admitting: Internal Medicine

## 2015-07-27 NOTE — Telephone Encounter (Signed)
°  Follow up Call-  Call back number 07/26/2015  Post procedure Call Back phone  # [phone] cell  Permission to leave phone message Yes     Patient questions:  Do you have a fever, pain , or abdominal swelling? No. Pain Score  0 *  Have you tolerated food without any problems? No.  Have you been able to return to your normal activities? Yes.    Do you have any questions about your discharge instructions: Diet   No. Medications  No. Follow up visit  No.  Do you have questions or concerns about your Care? No.  Actions: * If pain score is 4 or above: No action needed, pain <4.

## 2015-08-14 ENCOUNTER — Other Ambulatory Visit: Payer: Self-pay | Admitting: Obstetrics and Gynecology

## 2015-08-14 DIAGNOSIS — R928 Other abnormal and inconclusive findings on diagnostic imaging of breast: Secondary | ICD-10-CM

## 2015-08-17 ENCOUNTER — Ambulatory Visit
Admission: RE | Admit: 2015-08-17 | Discharge: 2015-08-17 | Disposition: A | Discharge status: Home / Self Care | Payer: 59 | Source: Ambulatory Visit | Attending: Obstetrics and Gynecology | Admitting: Obstetrics and Gynecology

## 2015-08-17 DIAGNOSIS — R928 Other abnormal and inconclusive findings on diagnostic imaging of breast: Secondary | ICD-10-CM

## 2015-09-10 MED FILL — MINIVELLE 0.1 MG PATCH: 0.1 | 28 days supply | Qty: 8 | Fill #1

## 2015-09-11 MED FILL — DOXEPIN 25 MG CAPSULE: 25 | 30 days supply | Qty: 30 | Fill #0

## 2015-09-12 MED FILL — CEPHALEXIN 500 MG CAPSULE: 500 | 15 days supply | Qty: 30 | Fill #0

## 2015-09-14 DIAGNOSIS — R209 Unspecified disturbances of skin sensation: Secondary | ICD-10-CM | POA: Diagnosis not present

## 2015-09-14 DIAGNOSIS — R200 Anesthesia of skin: Secondary | ICD-10-CM | POA: Diagnosis not present

## 2015-09-25 MED FILL — AZITHROMYCIN 250 MG TABLET: 250 | 5 days supply | Qty: 6 | Fill #0

## 2015-09-26 MED FILL — METHYLPHENIDATE 20 MG TAB: 20 | 90 days supply | Qty: 180 | Fill #0

## 2015-11-06 MED FILL — MINIVELLE 0.1 MG PATCH: 0.1 | 28 days supply | Qty: 8 | Fill #0

## 2015-11-06 MED FILL — CYCLOBENZAPRINE 10 MG TAB: 10 | 30 days supply | Qty: 30 | Fill #0

## 2015-11-06 MED FILL — AZITHROMYCIN 250 MG TABLET: 250 | 5 days supply | Qty: 6 | Fill #1

## 2015-11-06 MED FILL — hydrOXYzine HCL 25 MG TABS: 25 | 30 days supply | Qty: 30 | Fill #2

## 2015-12-12 MED FILL — FUROSEMIDE 20 MG TABLET: 20 | 30 days supply | Qty: 30 | Fill #0

## 2015-12-12 MED FILL — predniSONE 20 MG TABS: 20 | 10 days supply | Qty: 23 | Fill #0

## 2016-01-28 MED FILL — MINIVELLE 0.1 MG PATCH: 0.1 | 28 days supply | Qty: 8 | Fill #1

## 2016-02-19 MED FILL — TEMAZEPAM 15 MG CAPSULE: 15 | 30 days supply | Qty: 30 | Fill #0

## 2016-02-19 MED FILL — predniSONE 20 MG TABS: 20 | 15 days supply | Qty: 73 | Fill #0

## 2016-02-19 MED FILL — OMEPRAZOLE DR 20 MG CAPSULE: 20 | 30 days supply | Qty: 30 | Fill #0

## 2016-02-25 ENCOUNTER — Other Ambulatory Visit: Payer: Self-pay | Admitting: Obstetrics and Gynecology

## 2016-02-25 DIAGNOSIS — Z01419 Encounter for gynecological examination (general) (routine) without abnormal findings: Secondary | ICD-10-CM | POA: Diagnosis not present

## 2016-02-25 DIAGNOSIS — G35 Multiple sclerosis: Secondary | ICD-10-CM | POA: Diagnosis not present

## 2016-02-25 DIAGNOSIS — R87810 Cervical high risk human papillomavirus (HPV) DNA test positive: Secondary | ICD-10-CM | POA: Diagnosis not present

## 2016-02-25 DIAGNOSIS — R87610 Atypical squamous cells of undetermined significance on cytologic smear of cervix (ASC-US): Secondary | ICD-10-CM | POA: Diagnosis not present

## 2016-02-26 LAB — CYTOLOGY - PAP

## 2016-03-14 DIAGNOSIS — R200 Anesthesia of skin: Secondary | ICD-10-CM | POA: Diagnosis not present

## 2016-03-18 MED FILL — METHYLPHENIDATE 20 MG TAB: 20 | 90 days supply | Qty: 180 | Fill #0

## 2016-04-02 MED FILL — MINIVELLE 0.1 MG PATCH: 0.1 | 28 days supply | Qty: 8 | Fill #2

## 2016-05-07 DIAGNOSIS — T7840XA Allergy, unspecified, initial encounter: Secondary | ICD-10-CM | POA: Diagnosis not present

## 2016-05-07 DIAGNOSIS — R591 Generalized enlarged lymph nodes: Secondary | ICD-10-CM | POA: Diagnosis not present

## 2016-05-07 DIAGNOSIS — B349 Viral infection, unspecified: Secondary | ICD-10-CM | POA: Diagnosis not present

## 2016-05-07 DIAGNOSIS — G379 Demyelinating disease of central nervous system, unspecified: Secondary | ICD-10-CM | POA: Diagnosis not present

## 2016-05-15 MED FILL — hydrOXYzine HCL 25 MG TABS: 25 | 30 days supply | Qty: 30 | Fill #0

## 2016-05-15 MED FILL — CYCLOBENZAPRINE 10 MG TAB: 10 | 30 days supply | Qty: 30 | Fill #0

## 2016-05-15 MED FILL — DOXEPIN 25 MG CAPSULE: 25 | 30 days supply | Qty: 30 | Fill #1

## 2016-05-16 DIAGNOSIS — D2261 Melanocytic nevi of right upper limb, including shoulder: Secondary | ICD-10-CM | POA: Diagnosis not present

## 2016-05-16 DIAGNOSIS — D2271 Melanocytic nevi of right lower limb, including hip: Secondary | ICD-10-CM | POA: Diagnosis not present

## 2016-05-16 DIAGNOSIS — Z85828 Personal history of other malignant neoplasm of skin: Secondary | ICD-10-CM | POA: Diagnosis not present

## 2016-05-16 DIAGNOSIS — D225 Melanocytic nevi of trunk: Secondary | ICD-10-CM | POA: Diagnosis not present

## 2016-05-16 DIAGNOSIS — D2272 Melanocytic nevi of left lower limb, including hip: Secondary | ICD-10-CM | POA: Diagnosis not present

## 2016-05-16 DIAGNOSIS — D1801 Hemangioma of skin and subcutaneous tissue: Secondary | ICD-10-CM | POA: Diagnosis not present

## 2016-05-16 DIAGNOSIS — L821 Other seborrheic keratosis: Secondary | ICD-10-CM | POA: Diagnosis not present

## 2016-05-16 DIAGNOSIS — D2262 Melanocytic nevi of left upper limb, including shoulder: Secondary | ICD-10-CM | POA: Diagnosis not present

## 2016-05-28 MED FILL — PROGESTERONE 100 MG CAPSULE: 100 | 30 days supply | Qty: 30 | Fill #1

## 2016-05-28 MED FILL — MINIVELLE 0.1 MG PATCH: 0.1 | 28 days supply | Qty: 8 | Fill #3

## 2016-06-27 DIAGNOSIS — Z85828 Personal history of other malignant neoplasm of skin: Secondary | ICD-10-CM | POA: Diagnosis not present

## 2016-06-27 DIAGNOSIS — D1801 Hemangioma of skin and subcutaneous tissue: Secondary | ICD-10-CM | POA: Diagnosis not present

## 2016-06-27 DIAGNOSIS — D485 Neoplasm of uncertain behavior of skin: Secondary | ICD-10-CM | POA: Diagnosis not present

## 2016-07-01 MED FILL — TRETINOIN 0.025% CREAM: 0.025 | 30 days supply | Qty: 20 | Fill #0

## 2016-08-04 MED FILL — hydrOXYzine HCL 25 MG TABS: 25 | 30 days supply | Qty: 30 | Fill #1

## 2016-08-04 MED FILL — MINIVELLE 0.1 MG PATCH: 0.1 | 28 days supply | Qty: 8 | Fill #4

## 2016-08-12 ENCOUNTER — Other Ambulatory Visit (HOSPITAL_COMMUNITY): Payer: Self-pay | Admitting: Psychiatry

## 2016-08-12 DIAGNOSIS — G35 Multiple sclerosis: Secondary | ICD-10-CM

## 2016-08-16 ENCOUNTER — Ambulatory Visit (HOSPITAL_COMMUNITY): Payer: 59

## 2016-08-16 ENCOUNTER — Ambulatory Visit (HOSPITAL_COMMUNITY)
Admission: RE | Admit: 2016-08-16 | Discharge: 2016-08-16 | Disposition: A | Discharge status: Home / Self Care | Payer: 59 | Source: Ambulatory Visit | Attending: Psychiatry | Admitting: Psychiatry

## 2016-08-16 DIAGNOSIS — M50221 Other cervical disc displacement at C4-C5 level: Secondary | ICD-10-CM | POA: Insufficient documentation

## 2016-08-16 DIAGNOSIS — M2578 Osteophyte, vertebrae: Secondary | ICD-10-CM | POA: Diagnosis not present

## 2016-08-16 DIAGNOSIS — G35 Multiple sclerosis: Secondary | ICD-10-CM | POA: Diagnosis not present

## 2016-08-16 DIAGNOSIS — M50223 Other cervical disc displacement at C6-C7 level: Secondary | ICD-10-CM | POA: Diagnosis not present

## 2016-08-16 MED ORDER — GADOBENATE DIMEGLUMINE 529 MG/ML IV SOLN
13.0000 mL | Freq: Once | INTRAVENOUS | Status: AC | PRN
  Administered 2016-08-16: 13 mL via INTRAVENOUS

## 2016-09-05 DIAGNOSIS — J0120 Acute ethmoidal sinusitis, unspecified: Secondary | ICD-10-CM | POA: Diagnosis not present

## 2016-09-05 DIAGNOSIS — J069 Acute upper respiratory infection, unspecified: Secondary | ICD-10-CM | POA: Diagnosis not present

## 2016-09-05 MED FILL — predniSONE 5 MG TABS: 5 | 6 days supply | Qty: 21 | Fill #0

## 2016-09-05 MED FILL — CIPROFLOXACIN HCL 500 MG TA: 500 | 7 days supply | Qty: 14 | Fill #0

## 2016-09-19 DIAGNOSIS — R200 Anesthesia of skin: Secondary | ICD-10-CM | POA: Diagnosis not present

## 2016-09-19 MED FILL — METHYLPHENIDATE 20 MG TAB: 20 | 90 days supply | Qty: 180 | Fill #0

## 2016-09-23 MED FILL — MINIVELLE 0.1 MG PATCH: 0.1 | 28 days supply | Qty: 8 | Fill #5

## 2016-10-02 MED FILL — DOXEPIN 25 MG CAPSULE: 25 | 30 days supply | Qty: 30 | Fill #0

## 2016-10-14 MED FILL — OSELTAMIVIR PHOSPHATE 75 MG: 75 | 5 days supply | Qty: 10 | Fill #0

## 2016-10-24 DIAGNOSIS — Z85828 Personal history of other malignant neoplasm of skin: Secondary | ICD-10-CM | POA: Diagnosis not present

## 2016-10-24 DIAGNOSIS — L718 Other rosacea: Secondary | ICD-10-CM | POA: Diagnosis not present

## 2016-10-24 MED FILL — metroNIDAZOLE 0.75 % CREA: 0.75 | 30 days supply | Qty: 45 | Fill #0

## 2016-10-24 MED FILL — ERYTHROMYCIN 500 MG FILMTAB: 500 | 30 days supply | Qty: 30 | Fill #0

## 2016-11-24 MED FILL — hydrOXYzine HCL 25 MG TABS: 25 | 30 days supply | Qty: 30 | Fill #2

## 2016-11-25 MED FILL — MINIVELLE 0.1 MG PATCH: 0.1 | 28 days supply | Qty: 8 | Fill #0

## 2016-11-26 MED FILL — ERYTHROMYCIN 500 MG FILMTAB: 500 | 30 days supply | Qty: 30 | Fill #1

## 2016-11-26 MED FILL — PROGESTERONE 100 MG CAPSULE: 100 | 30 days supply | Qty: 30 | Fill #0

## 2016-12-16 DIAGNOSIS — Z01419 Encounter for gynecological examination (general) (routine) without abnormal findings: Secondary | ICD-10-CM | POA: Diagnosis not present

## 2016-12-16 DIAGNOSIS — Z6824 Body mass index (BMI) 24.0-24.9, adult: Secondary | ICD-10-CM | POA: Diagnosis not present

## 2016-12-16 DIAGNOSIS — Z1231 Encounter for screening mammogram for malignant neoplasm of breast: Secondary | ICD-10-CM | POA: Diagnosis not present

## 2016-12-22 MED FILL — PROGESTERONE 100 MG CAPSULE: 100 | 30 days supply | Qty: 30 | Fill #0

## 2017-01-05 MED FILL — ERYTHROMYCIN BASE 500 MG TA: 500 | 30 days supply | Qty: 30 | Fill #2

## 2017-01-07 DIAGNOSIS — Z85828 Personal history of other malignant neoplasm of skin: Secondary | ICD-10-CM | POA: Diagnosis not present

## 2017-01-07 DIAGNOSIS — L718 Other rosacea: Secondary | ICD-10-CM | POA: Diagnosis not present

## 2017-01-07 MED FILL — FINACEA 15% FOAM: 15 | 30 days supply | Qty: 50 | Fill #0

## 2017-01-07 MED FILL — predniSONE 20 MG TABS: 20 | 10 days supply | Qty: 23 | Fill #0

## 2017-01-21 MED FILL — MINIVELLE 0.1 MG PATCH: 0.1 | 28 days supply | Qty: 8 | Fill #1

## 2017-01-21 MED FILL — DOXEPIN 25 MG CAPSULE: 25 | 30 days supply | Qty: 30 | Fill #1

## 2017-01-23 DIAGNOSIS — L0101 Non-bullous impetigo: Secondary | ICD-10-CM | POA: Diagnosis not present

## 2017-01-23 DIAGNOSIS — L718 Other rosacea: Secondary | ICD-10-CM | POA: Diagnosis not present

## 2017-01-23 DIAGNOSIS — Z85828 Personal history of other malignant neoplasm of skin: Secondary | ICD-10-CM | POA: Diagnosis not present

## 2017-02-05 DIAGNOSIS — Z79899 Other long term (current) drug therapy: Secondary | ICD-10-CM | POA: Diagnosis not present

## 2017-02-05 DIAGNOSIS — L700 Acne vulgaris: Secondary | ICD-10-CM | POA: Diagnosis not present

## 2017-02-06 DIAGNOSIS — Z658 Other specified problems related to psychosocial circumstances: Secondary | ICD-10-CM | POA: Diagnosis not present

## 2017-02-06 DIAGNOSIS — Z0000 Encounter for general adult medical examination without abnormal findings: Secondary | ICD-10-CM | POA: Diagnosis not present

## 2017-02-06 DIAGNOSIS — G379 Demyelinating disease of central nervous system, unspecified: Secondary | ICD-10-CM | POA: Diagnosis not present

## 2017-02-09 MED FILL — MYORISAN 40 MG CAPSULE: 40 | 30 days supply | Qty: 30 | Fill #0

## 2017-02-17 DIAGNOSIS — H5203 Hypermetropia, bilateral: Secondary | ICD-10-CM | POA: Diagnosis not present

## 2017-02-18 MED FILL — MINIVELLE 0.1 MG PATCH: 0.1 | 28 days supply | Qty: 8 | Fill #0

## 2017-03-13 DIAGNOSIS — Z85828 Personal history of other malignant neoplasm of skin: Secondary | ICD-10-CM | POA: Diagnosis not present

## 2017-03-13 DIAGNOSIS — L700 Acne vulgaris: Secondary | ICD-10-CM | POA: Diagnosis not present

## 2017-03-13 DIAGNOSIS — Z79899 Other long term (current) drug therapy: Secondary | ICD-10-CM | POA: Diagnosis not present

## 2017-03-16 MED FILL — MYORISAN 30 MG CAPSULE: 30 | 30 days supply | Qty: 60 | Fill #0

## 2017-03-26 MED FILL — valACYclovir HCL 1 GM TABS: 1 | 2 days supply | Qty: 10 | Fill #0

## 2017-03-27 MED FILL — MINIVELLE 0.1 MG PATCH: 0.1 | 28 days supply | Qty: 8 | Fill #1

## 2017-04-02 MED FILL — DOXEPIN 25 MG CAPSULE: 25 | 30 days supply | Qty: 30 | Fill #2

## 2017-04-15 DIAGNOSIS — L718 Other rosacea: Secondary | ICD-10-CM | POA: Diagnosis not present

## 2017-04-15 DIAGNOSIS — Z79899 Other long term (current) drug therapy: Secondary | ICD-10-CM | POA: Diagnosis not present

## 2017-04-15 DIAGNOSIS — Z85828 Personal history of other malignant neoplasm of skin: Secondary | ICD-10-CM | POA: Diagnosis not present

## 2017-04-15 DIAGNOSIS — L700 Acne vulgaris: Secondary | ICD-10-CM | POA: Diagnosis not present

## 2017-04-16 MED FILL — MYORISAN 30 MG CAPSULE: 30 | 30 days supply | Qty: 60 | Fill #0

## 2017-04-29 MED FILL — MINIVELLE 0.1 MG PATCH: 0.1 | 28 days supply | Qty: 8 | Fill #2

## 2017-04-30 MED FILL — hydrOXYzine HCL 25 MG TABS: 25 | 30 days supply | Qty: 30 | Fill #0

## 2017-05-01 ENCOUNTER — Other Ambulatory Visit: Payer: Self-pay | Admitting: Psychiatry

## 2017-05-01 DIAGNOSIS — G35 Multiple sclerosis: Secondary | ICD-10-CM

## 2017-05-01 MED FILL — FUROSEMIDE 20 MG TABLET: 20 | 30 days supply | Qty: 30 | Fill #0

## 2017-05-01 MED FILL — OMEPRAZOLE 20 MG CAP: 20 | 30 days supply | Qty: 30 | Fill #0

## 2017-05-01 MED FILL — predniSONE 20 MG TABS: 20 | 15 days supply | Qty: 73 | Fill #0

## 2017-05-10 ENCOUNTER — Encounter: Payer: Self-pay | Admitting: Radiology

## 2017-05-10 ENCOUNTER — Ambulatory Visit
Admission: RE | Admit: 2017-05-10 | Discharge: 2017-05-10 | Disposition: A | Discharge status: Home / Self Care | Payer: 59 | Source: Ambulatory Visit | Attending: Psychiatry | Admitting: Psychiatry

## 2017-05-10 DIAGNOSIS — R200 Anesthesia of skin: Secondary | ICD-10-CM | POA: Diagnosis not present

## 2017-05-10 DIAGNOSIS — G35 Multiple sclerosis: Secondary | ICD-10-CM

## 2017-05-10 MED ORDER — GADOBENATE DIMEGLUMINE 529 MG/ML IV SOLN
13.0000 mL | Freq: Once | INTRAVENOUS | Status: AC | PRN
Start: 2017-05-10 — End: 2017-05-10
  Administered 2017-05-10: 13 mL via INTRAVENOUS

## 2017-05-20 DIAGNOSIS — L718 Other rosacea: Secondary | ICD-10-CM | POA: Diagnosis not present

## 2017-05-20 DIAGNOSIS — Z79899 Other long term (current) drug therapy: Secondary | ICD-10-CM | POA: Diagnosis not present

## 2017-05-20 DIAGNOSIS — Z85828 Personal history of other malignant neoplasm of skin: Secondary | ICD-10-CM | POA: Diagnosis not present

## 2017-05-21 MED FILL — MYORISAN 30 MG CAPSULE: 30 | 30 days supply | Qty: 60 | Fill #0

## 2017-06-03 DIAGNOSIS — R200 Anesthesia of skin: Secondary | ICD-10-CM | POA: Diagnosis not present

## 2017-06-04 MED FILL — CYCLOBENZAPRINE 10 MG TAB: 10 | 30 days supply | Qty: 30 | Fill #0

## 2017-06-08 MED FILL — METHYLPHENIDATE 20 MG TAB: 20 | 90 days supply | Qty: 180 | Fill #0

## 2017-06-09 DIAGNOSIS — R300 Dysuria: Secondary | ICD-10-CM | POA: Diagnosis not present

## 2017-06-09 MED FILL — CIPROFLOXACIN HCL 500 MG TA: 500 | 7 days supply | Qty: 14 | Fill #0

## 2017-06-17 DIAGNOSIS — L700 Acne vulgaris: Secondary | ICD-10-CM | POA: Diagnosis not present

## 2017-06-17 DIAGNOSIS — L718 Other rosacea: Secondary | ICD-10-CM | POA: Diagnosis not present

## 2017-06-17 DIAGNOSIS — Z79899 Other long term (current) drug therapy: Secondary | ICD-10-CM | POA: Diagnosis not present

## 2017-06-17 DIAGNOSIS — Z85828 Personal history of other malignant neoplasm of skin: Secondary | ICD-10-CM | POA: Diagnosis not present

## 2017-06-19 MED FILL — MINIVELLE 0.1 MG PATCH: 0.1 | 28 days supply | Qty: 8 | Fill #3

## 2017-07-03 DIAGNOSIS — D2272 Melanocytic nevi of left lower limb, including hip: Secondary | ICD-10-CM | POA: Diagnosis not present

## 2017-07-03 DIAGNOSIS — D2262 Melanocytic nevi of left upper limb, including shoulder: Secondary | ICD-10-CM | POA: Diagnosis not present

## 2017-07-03 DIAGNOSIS — L718 Other rosacea: Secondary | ICD-10-CM | POA: Diagnosis not present

## 2017-07-03 DIAGNOSIS — D225 Melanocytic nevi of trunk: Secondary | ICD-10-CM | POA: Diagnosis not present

## 2017-07-03 DIAGNOSIS — L821 Other seborrheic keratosis: Secondary | ICD-10-CM | POA: Diagnosis not present

## 2017-07-03 DIAGNOSIS — D2261 Melanocytic nevi of right upper limb, including shoulder: Secondary | ICD-10-CM | POA: Diagnosis not present

## 2017-07-03 DIAGNOSIS — Z85828 Personal history of other malignant neoplasm of skin: Secondary | ICD-10-CM | POA: Diagnosis not present

## 2017-07-03 DIAGNOSIS — D2271 Melanocytic nevi of right lower limb, including hip: Secondary | ICD-10-CM | POA: Diagnosis not present

## 2017-07-03 DIAGNOSIS — D1801 Hemangioma of skin and subcutaneous tissue: Secondary | ICD-10-CM | POA: Diagnosis not present

## 2017-07-06 DIAGNOSIS — G43009 Migraine without aura, not intractable, without status migrainosus: Secondary | ICD-10-CM | POA: Diagnosis not present

## 2017-07-06 DIAGNOSIS — G43109 Migraine with aura, not intractable, without status migrainosus: Secondary | ICD-10-CM | POA: Diagnosis not present

## 2017-07-06 MED FILL — TOPIRAMATE 25 MG CPSP: 25 | 60 days supply | Qty: 180 | Fill #0

## 2017-07-06 MED FILL — IBUPROFEN 800 MG TABS: 800 | 30 days supply | Qty: 90 | Fill #0

## 2017-07-16 DIAGNOSIS — Z79899 Other long term (current) drug therapy: Secondary | ICD-10-CM | POA: Diagnosis not present

## 2017-07-16 DIAGNOSIS — Z85828 Personal history of other malignant neoplasm of skin: Secondary | ICD-10-CM | POA: Diagnosis not present

## 2017-07-16 DIAGNOSIS — L718 Other rosacea: Secondary | ICD-10-CM | POA: Diagnosis not present

## 2017-07-27 DIAGNOSIS — R05 Cough: Secondary | ICD-10-CM | POA: Diagnosis not present

## 2017-07-27 DIAGNOSIS — J3489 Other specified disorders of nose and nasal sinuses: Secondary | ICD-10-CM | POA: Diagnosis not present

## 2017-07-27 DIAGNOSIS — H6501 Acute serous otitis media, right ear: Secondary | ICD-10-CM | POA: Diagnosis not present

## 2017-07-27 DIAGNOSIS — J0110 Acute frontal sinusitis, unspecified: Secondary | ICD-10-CM | POA: Diagnosis not present

## 2017-07-27 MED FILL — HYDROCODONE-CHLORPHENIRAM S: 10-8 | 5 days supply | Qty: 50 | Fill #0

## 2017-07-27 MED FILL — levoFLOXacin 500 MG TABS: 500 | 10 days supply | Qty: 10 | Fill #0

## 2017-08-03 MED FILL — metroNIDAZOLE 0.75 % CREA: 0.75 | 20 days supply | Qty: 45 | Fill #0

## 2017-08-03 MED FILL — DOXEPIN 25 MG CAPSULE: 25 | 30 days supply | Qty: 30 | Fill #0

## 2017-08-03 MED FILL — ESTRADIOL 0.1 MG/24HR PTTW: 0.1 | 28 days supply | Qty: 8 | Fill #4

## 2017-09-29 ENCOUNTER — Other Ambulatory Visit: Payer: Self-pay

## 2017-09-29 DIAGNOSIS — I83893 Varicose veins of bilateral lower extremities with other complications: Secondary | ICD-10-CM

## 2017-10-12 ENCOUNTER — Encounter: Payer: Self-pay | Admitting: Neurology

## 2017-10-12 ENCOUNTER — Other Ambulatory Visit: Payer: Self-pay

## 2017-10-12 ENCOUNTER — Ambulatory Visit: Payer: 59 | Admitting: Neurology

## 2017-10-12 VITALS — BP 140/94 | HR 76 | Resp 14 | Ht 64.0 in | Wt 147.5 lb

## 2017-10-12 DIAGNOSIS — H469 Unspecified optic neuritis: Secondary | ICD-10-CM

## 2017-10-12 DIAGNOSIS — R200 Anesthesia of skin: Secondary | ICD-10-CM

## 2017-10-12 DIAGNOSIS — R269 Unspecified abnormalities of gait and mobility: Secondary | ICD-10-CM | POA: Diagnosis not present

## 2017-10-12 DIAGNOSIS — R5383 Other fatigue: Secondary | ICD-10-CM | POA: Insufficient documentation

## 2017-10-12 MED ORDER — AMANTADINE HCL 100 MG PO CAPS: 100.0000 mg | ORAL_CAPSULE | Freq: Two times a day (BID) | ORAL | 3 refills | Status: DC

## 2017-10-12 MED FILL — AMANTADINE HCL 100 MG TAB: 100 | 30 days supply | Qty: 60 | Fill #0

## 2017-10-12 NOTE — Progress Notes (Addendum)
[facility]  PATIENT: [name] [name] DOB: May 21, 1971  REFERRING DOCTOR OR PCP:  Dr. Inda Merlin SOURCE: Notes from PCP and Dr. Nigel Bridgeman, imaging lab reports, MRI images on PACS.  _________________________________   HISTORICAL  CHIEF COMPLAINT:  Chief Complaint  Patient presents with  . Multiple Sclerosis    Sts. hx. of left optic neuritis about 10 yrs. ago. Dx. with MS 4-5 yrs. ago. Was initially followed by Dr. Erling Cruz her at [facility], then transferred care to Dr. Dellis Filbert when Dr. Erling Cruz retired.  Has never been on a dmt.  Sts. has exacerbations of sx. about twice per yr. and Dr. Dellis Filbert treats these with steroids. On Ritalin for fatigue./fim    HISTORY OF PRESENT ILLNESS:  I had the pleasure seeing you patient, [name] [name], at [facility] for neurologic consultation regarding her history of optic neuritis and possibility of multiple sclerosis.  In 2009, she lost vision in her left eye.   She had a black central dot and blurriness around that.    She felt symptoms resolved within an hour but was back the next day.    She had a similar recurrence the next day.    She saw ophthalmology (Dr. Zadie Rhine) and was diagnosed with optic neuritis.    MRI's of the brain and cervical spine were normal.     She had numbness in her face shortly later.    She was referred to Dr. Hart Robinsons and then another doctor at [facility].    MS was not confirmed.   She saw Dr. Jacqulynn Cadet who felt she probably had MS but did not start a DMT.    She then had an episode of left leg numbness and left foot drop that comes and goes for a day or two and still does a couple times a year.    She also has a lot of fatigue (physical > cognitive).   When this occurs, she is more likely to have an episode involving the left leg.   She has had a couple more episodes of blurry vision but none the past year.   She has had steroids (starting with 200 mg and tapering down over a week or so) about twice a year.   The  last time was summer 2018.  Some episodes are associated with her gait worsening and even her speech being off.     She always feels better for a few months afterwards.     She is on Ritalin 10 mg po bid and that usually helps her fatigue.  She sleeps well most every night.   She denies any major problems with mood.    Right now, she denies any issues with her gait, strength, sensation or balance. Patient is doing well. She continues to have fatigue.      She was initially evaluated by Dr. Erling Cruz and had MRI studies, evoked potential studies CSF analysis (no oligoclonal bands and IgG index was normal) and blood work including NMO, anti-cardiolipin antibody, ANA, Ace, RPR, ESR, B12, Lyme. The blood work was normal or negative.  I personally reviewed the MRIs of the brain dated 05/10/2017 month 08/14/2014 and 05/10/2008 an MRI of the cervical spine from 12/16/2016 and MRI the thoracic spine from 08/16/2008. There were no demyelinating lesions in any of the MRIs  REVIEW OF SYSTEMS: Constitutional: No fevers, chills, sweats, or change in appetite.   Notes fatigue Eyes: As above Ear, nose and throat: No hearing loss, ear pain, nasal congestion, sore throat  Cardiovascular: No chest pain, palpitations Respiratory: No shortness of breath at rest or with exertion.   No wheezes GastrointestinaI: No nausea, vomiting, diarrhea, abdominal pain, fecal incontinence Genitourinary: No dysuria, urinary retention or frequency.  No nocturia. Musculoskeletal: No neck pain, back pain Integumentary: No rash, pruritus, skin lesions Neurological: as above Psychiatric: No depression at this time.  No anxiety Endocrine: No palpitations, diaphoresis, change in appetite, change in weigh or increased thirst Hematologic/Lymphatic: No anemia, purpura, petechiae. Allergic/Immunologic: No itchy/runny eyes, nasal congestion, recent allergic reactions, rashes  ALLERGIES: Allergies  Allergen Reactions  . Augmentin  [Amoxicillin-Pot Clavulanate] Swelling    tongue swelling  . Venlafaxine Hives    blisters  . Demerol [Meperidine] Rash  . Estrogens Rash    The patient can use patch,No oral  . Gabapentin Rash  . Stadol [Butorphanol] Rash    HOME MEDICATIONS:  Current Outpatient Medications:  .  cetirizine (ZYRTEC) 10 MG tablet, Take 10 mg by mouth daily., Disp: , Rfl:  .  hydrOXYzine (ATARAX/VISTARIL) 25 MG tablet, Take 25 mg by mouth at bedtime., Disp: , Rfl:  .  methylphenidate (RITALIN) 10 MG tablet, Take 10 mg by mouth 2 (two) times daily., Disp: , Rfl:  .  MINIVELLE 0.1 MG/24HR patch, , Disp: , Rfl: 1  PAST MEDICAL HISTORY: Past Medical History:  Diagnosis Date  . Allergy   . Anxiety   . Cancer ([place])    basal cell skin ca  . Depression   . Multiple sclerosis ([place])   . Neuromuscular disorder ([place])    multiple sclerosis  . Vision abnormalities     PAST SURGICAL HISTORY: Past Surgical History:  Procedure Laterality Date  . adnoidectomy    . BUNIONECTOMY Left    foot  . CESAREAN SECTION    . CHOLECYSTECTOMY    . wisdom teeth exraction      FAMILY HISTORY: Family History  Problem Relation Age of Onset  . Cervical cancer Mother   . Dementia Mother   . Melanoma Father   . Ulcerative colitis Sister   . Colon cancer Neg Hx   . Esophageal cancer Neg Hx   . Rectal cancer Neg Hx   . Stomach cancer Neg Hx     SOCIAL HISTORY:  Social History   Socioeconomic History  . Marital status: Divorced    Spouse name: Not on file  . Number of children: 2  . Years of education: Not on file  . Highest education level: Not on file  Social Needs  . Financial resource strain: Not on file  . Food insecurity - worry: Not on file  . Food insecurity - inability: Not on file  . Transportation needs - medical: Not on file  . Transportation needs - non-medical: Not on file  Occupational History  . Not on file  Tobacco Use  . Smoking status: Never Smoker  . Smokeless tobacco: Never Used   Substance and Sexual Activity  . Alcohol use: Yes    Alcohol/week: 0.0 oz    Comment: rarely  . Drug use: No  . Sexual activity: Yes  Other Topics Concern  . Not on file  Social History Narrative  . Not on file     PHYSICAL EXAM  Vitals:   10/12/17 0842  BP: (!) 140/94  Pulse: 76  Resp: 14  Weight: 147 lb 8 oz (66.9 kg)  Height: _0  (1.626 m)    Body mass index is 25.32 kg/m.   General: The patient is well-developed and  well-nourished and in no acute distress  Eyes:  Funduscopic exam shows normal optic discs and retinal vessels.  Neck: The neck is supple, no carotid bruits are noted.  The neck is nontender.  Cardiovascular: The heart has a regular rate and rhythm with a normal S1 and S2. There were no murmurs, gallops or rubs.    Skin: Extremities are without significant edema.  Musculoskeletal:  Back is nontender  Neurologic Exam  Mental status: The patient is alert and oriented x 3 at the time of the examination. The patient has apparent normal recent and remote memory, with an apparently normal attention span and concentration ability.   Speech is normal.  Cranial nerves: Extraocular movements are full. Pupils are equal, round, and reactive to light and accomodation.  Visual fields are full.  Facial symmetry is present. There is good facial sensation to soft touch bilaterally.Facial strength is normal.  Trapezius and sternocleidomastoid strength is normal. No dysarthria is noted.  The tongue is midline, and the patient has symmetric elevation of the soft palate. No obvious hearing deficits are noted.  Motor:  Muscle bulk is normal.   Tone is normal. Strength is  5 / 5 in all 4 extremities.   Sensory: Sensory testing is intact to pinprick, soft touch and vibration sensation in all 4 extremities.  Coordination: Cerebellar testing reveals good finger-nose-finger and heel-to-shin bilaterally.  Gait and station: Station is normal.   Gait is normal. Tandem gait is  normal. Romberg is borderline.   Reflexes: Deep tendon reflexes are symmetric and normal bilaterally.   Plantar responses are flexor.    DIAGNOSTIC DATA (LABS, IMAGING, TESTING) - I reviewed patient records, labs, notes, testing and imaging myself where available.  Lab Results  Component Value Date   WBC 14.3 (H) 09/19/2009   HGB 17.1 (H) 09/19/2009   HCT 49.8 (H) 09/19/2009   MCV 89.5 09/19/2009   PLT 342 09/19/2009      ASSESSMENT AND PLAN  Optic neuritis - Plan: TSH, Neuromyelitis optica autoab, IgG, CBC with Differential/Platelet, Comprehensive metabolic panel, EBV, Chronic/Active Infection  Other fatigue - Plan: TSH, CBC with Differential/Platelet, Comprehensive metabolic panel, EBV, Chronic/Active Infection  Numbness  Gait disturbance   In summary, Lucindy Borel is a 47 year old woman who had optic neuritis in 2009 and continues to have some fluctuating neurologic symptoms and fatigue. She was diagnosed with MS but never started on a disease modifying therapy.. However, her MRI of the brain and the spinal cord did not show any evidence of demyelination and her cerebrospinal fluid has been normal.  Optic neuritis and a normal MRI is associated with MS about 25% of the time.   Most people who have advised to go on to develop MS will have done so by 2-3 years.   Nine years has passed by without change in MRI implying that the likelihood that she has MS is now very low.    I don't have a great explanation for her fluctuating neurologic symptoms. Vasculitic evaluation has been normal. I will check for chronic Epstein-Barr infection and also check thyroid function and other labs. We will check a neuromyelitis optica antibody to make sure that there is no evidence of Devic's disease.   I wrote a prescription for amantadine. If she has another episode of severe fatigue associated with neurologic symptoms, she will take amantadine. If that is not effective we could always consider steroid  bolus. However, given the long-term effects of steroids I would prefer not for her to  do this a couple times every year.  She will return to see me in 6 months but call sooner if she has new or worsening neurologic symptoms.      Kashden Deboy A. Felecia Shelling, MD, [facility] 0/11/5463, 6:81 AM Certified in Neurology, Clinical Neurophysiology, Sleep Medicine, Pain Medicine and Neuroimaging  [facility] [address] [place], [place] [address] [phone]

## 2017-10-14 ENCOUNTER — Telehealth: Payer: Self-pay | Admitting: *Deleted

## 2017-10-14 LAB — COMPREHENSIVE METABOLIC PANEL
ALK PHOS: 100 IU/L (ref 39–117)
ALT: 34 IU/L — ABNORMAL HIGH (ref 0–32)
AST: 25 IU/L (ref 0–40)
Albumin/Globulin Ratio: 1.8 (ref 1.2–2.2)
Albumin: 4.6 g/dL (ref 3.5–5.5)
BUN/Creatinine Ratio: 18 (ref 9–23)
BUN: 14 mg/dL (ref 6–24)
Bilirubin Total: 0.3 mg/dL (ref 0.0–1.2)
CO2: 24 mmol/L (ref 20–29)
Calcium: 10.2 mg/dL (ref 8.7–10.2)
Chloride: 105 mmol/L (ref 96–106)
Creatinine, Ser: 0.78 mg/dL (ref 0.57–1.00)
GFR calc Af Amer: 104 mL/min/{1.73_m2} (ref >59)
GFR calc non Af Amer: 90 mL/min/{1.73_m2} (ref >59)
Globulin, Total: 2.5 g/dL (ref 1.5–4.5)
Glucose: 91 mg/dL (ref 65–99)
Potassium: 4.9 mmol/L (ref 3.5–5.2)
Sodium: 143 mmol/L (ref 134–144)
Total Protein: 7.1 g/dL (ref 6.0–8.5)

## 2017-10-14 LAB — NEUROMYELITIS OPTICA AUTOAB, IGG: NMO IgG Autoantibodies: <1.5 U/mL (ref 0.0–3.0)

## 2017-10-14 LAB — EBV, CHRONIC/ACTIVE INFECTION
EBV NA IgG: <18 U/mL (ref 0.0–17.9)
EBV VCA IGG: 227 U/mL — AB (ref 0.0–17.9)

## 2017-10-14 LAB — CBC WITH DIFFERENTIAL/PLATELET
Basophils Absolute: 0 10*3/uL (ref 0.0–0.2)
Basos: 1 %
EOS (ABSOLUTE): 0.1 10*3/uL (ref 0.0–0.4)
Eos: 2 %
Hematocrit: 46.9 % — ABNORMAL HIGH (ref 34.0–46.6)
Hemoglobin: 15.6 g/dL (ref 11.1–15.9)
IMMATURE GRANULOCYTES: 0 %
Immature Grans (Abs): 0 10*3/uL (ref 0.0–0.1)
Lymphocytes Absolute: 1.8 10*3/uL (ref 0.7–3.1)
Lymphs: 25 %
MCH: 30.3 pg (ref 26.6–33.0)
MCHC: 33.3 g/dL (ref 31.5–35.7)
MCV: 91 fL (ref 79–97)
Monocytes Absolute: 0.8 10*3/uL (ref 0.1–0.9)
Monocytes: 11 %
NEUTROS PCT: 61 %
Neutrophils Absolute: 4.5 10*3/uL (ref 1.4–7.0)
Platelets: 368 10*3/uL (ref 150–379)
RBC: 5.15 x10E6/uL (ref 3.77–5.28)
RDW: 14.5 % (ref 12.3–15.4)
WBC: 7.3 10*3/uL (ref 3.4–10.8)

## 2017-10-14 LAB — TSH: TSH: 1.81 u[IU]/mL (ref 0.450–4.500)

## 2017-10-14 NOTE — Telephone Encounter (Signed)
-----   Message from Britt Bottom, MD sent at 10/14/2017  4:42 PM EST ----- Please let the patient know that the lab work is fine.    She did not have evidence of a chronic Epstein-Barr virus but has had it sometimes in the past (about 90% of people get Epstein-Barr in either childhood or adolescence)

## 2017-10-14 NOTE — Telephone Encounter (Signed)
Spoke with Hinton Dyer and reviewed below lab results.  She verbalized understanding of same/fim

## 2017-10-29 MED FILL — ESTRADIOL 0.1 MG/24HR PTTW: 0.1 | 28 days supply | Qty: 8 | Fill #5

## 2017-11-02 ENCOUNTER — Ambulatory Visit: Payer: 59 | Admitting: Vascular Surgery

## 2017-11-02 ENCOUNTER — Encounter: Payer: Self-pay | Admitting: Vascular Surgery

## 2017-11-02 ENCOUNTER — Ambulatory Visit (HOSPITAL_COMMUNITY)
Admission: RE | Admit: 2017-11-02 | Discharge: 2017-11-02 | Disposition: A | Discharge status: Home / Self Care | Payer: 59 | Source: Ambulatory Visit | Attending: Vascular Surgery | Admitting: Vascular Surgery

## 2017-11-02 VITALS — BP 111/82 | HR 83 | Temp 97.4°F | Resp 18 | Ht 65.25 in | Wt 143.9 lb

## 2017-11-02 DIAGNOSIS — I8393 Asymptomatic varicose veins of bilateral lower extremities: Secondary | ICD-10-CM

## 2017-11-02 DIAGNOSIS — I872 Venous insufficiency (chronic) (peripheral): Secondary | ICD-10-CM | POA: Insufficient documentation

## 2017-11-02 DIAGNOSIS — I83893 Varicose veins of bilateral lower extremities with other complications: Secondary | ICD-10-CM | POA: Diagnosis not present

## 2017-11-02 NOTE — Progress Notes (Signed)
Subjective:     Patient ID: [name] [name], female   DOB: 1971-02-14, 47 y.o.   MRN: [id number]  HPI This 47 year old female was seen by me approximately 9 years ago spider veins and had successful foam sclerotherapy. She returns now with a similar complaint. She has a history of DVT thrombophlebitis stasis ulcers or bleeding. She does not elastic compression stockings and denies any distal edema. She does have a history of MS. She has no symptoms in the spider veins which are in both eyes.  Past Medical History:  Diagnosis Date  . Allergy   . Anxiety   . Cancer ([place])    basal cell skin ca  . Depression   . Multiple sclerosis ([place])   . Neuromuscular disorder ([place])    multiple sclerosis  . Vision abnormalities     Social History   Tobacco Use  . Smoking status: Never Smoker  . Smokeless tobacco: Never Used  Substance Use Topics  . Alcohol use: Yes    Alcohol/week: 0.0 oz    Comment: rarely    Family History  Problem Relation Age of Onset  . Cervical cancer Mother   . Dementia Mother   . Melanoma Father   . Ulcerative colitis Sister   . Colon cancer Neg Hx   . Esophageal cancer Neg Hx   . Rectal cancer Neg Hx   . Stomach cancer Neg Hx     Allergies  Allergen Reactions  . Augmentin [Amoxicillin-Pot Clavulanate] Swelling    tongue swelling  . Venlafaxine Hives    blisters  . Demerol [Meperidine] Rash  . Estrogens Rash    The patient can use patch,No oral  . Gabapentin Rash  . Stadol [Butorphanol] Rash     Current Outpatient Medications:  .  amantadine (SYMMETREL) 100 MG capsule, Take 1 capsule (100 mg total) by mouth 2 (two) times daily., Disp: 60 capsule, Rfl: 3 .  cetirizine (ZYRTEC) 10 MG tablet, Take 10 mg by mouth daily., Disp: , Rfl:  .  hydrOXYzine (ATARAX/VISTARIL) 25 MG tablet, Take 25 mg by mouth at bedtime., Disp: , Rfl:  .  methylphenidate (RITALIN) 10 MG tablet, Take 10 mg by mouth 2 (two) times daily., Disp: , Rfl:  .  MINIVELLE 0.1 MG/24HR patch,  , Disp: , Rfl: 1  Vitals:   11/02/17 1353  BP: 111/82  Pulse: 83  Resp: 18  Temp: (!) 97.4 F (36.3 C)  TempSrc: Oral  SpO2: 98%  Weight: 143 lb 14.4 oz (65.3 kg)  Height: 5' 5.25" (1.657 m)    Body mass index is 23.76 kg/m.         Review of Systems See history of present illness-has history of multiple sclerosis, denies chest pain, dyspnea on exertion, PND, orthopnea, hemoptysis    Objective:   Physical Exam BP 111/82 (BP Location: Left Arm, Patient Position: Sitting, Cuff Size: Normal)   Pulse 83   Temp (!) 97.4 F (36.3 C) (Oral)   Resp 18   Ht 5' 5.25" (1.657 m)   Wt 143 lb 14.4 oz (65.3 kg)   LMP 07/13/2015 (Exact Date)   SpO2 98%   BMI 23.76 kg/m     Gen.-alert and oriented x3 in no apparent distress HEENT normal for age Lungs no rhonchi or wheezing Cardiovascular regular rhythm no murmurs carotid pulses 3+ palpable no bruits audible Abdomen soft nontender no palpable masses Musculoskeletal free of  major deformities Skin clear -no rashes, a few spider veins located medial thighs bilaterally  with no bulging varicosities noted. No hyperpigmentation or ulceration noted. Neurologic normal Lower extremities 3+ femoral and dorsalis pedis pulses palpable bilaterally with no edema  Ordered bilateral venous duplex exam which I reviewed and interpreted and also performed a bedside SonoSite ultrasound exam. Bilateral great saphenous veins are normal in size with no evidence of significant reflux throughout and small saphenous veins are also normal in appearance      Assessment:     Spider veins bilateral lower extremities-asymptomatic Multiple sclerosis-doing well    Plan:     Discussed with patient that treatment would consist of foam sclerotherapy and have given her contact information for Thea Silversmith and she will notify her

## 2017-11-04 ENCOUNTER — Encounter: Payer: Self-pay | Admitting: Neurology

## 2017-11-05 MED FILL — CYCLOBENZAPRINE 10 MG TAB: 10 | 30 days supply | Qty: 30 | Fill #0

## 2017-11-17 ENCOUNTER — Encounter (HOSPITAL_COMMUNITY): Payer: 59

## 2017-11-17 ENCOUNTER — Encounter: Payer: 59 | Admitting: Vascular Surgery

## 2017-11-25 ENCOUNTER — Ambulatory Visit: Payer: Self-pay | Admitting: *Deleted

## 2017-11-25 DIAGNOSIS — I8393 Asymptomatic varicose veins of bilateral lower extremities: Secondary | ICD-10-CM

## 2017-11-25 NOTE — Progress Notes (Signed)
X=% Sotradecol administered with a 27g butterfly.  Patient received a total of 6cc.  Had a good result from last tx which was years ago. Just needed a clean up. Easy access. Tol well. Will follow prn.  Photos: Yes.    Compression stockings applied: Yes.

## 2017-12-10 ENCOUNTER — Other Ambulatory Visit: Payer: Self-pay | Admitting: Gynecologic Oncology

## 2017-12-10 DIAGNOSIS — N950 Postmenopausal bleeding: Secondary | ICD-10-CM

## 2017-12-10 NOTE — Progress Notes (Signed)
Patient with complaints of heavy vaginal bleeding for 7 days with a decrease in the amount yesterday but now beginning to increase today. Pelvic pain reported, more on the left pelvis.  She has been taking estrogen for the past two years.  She had a reaction to progesterone so she has only been taking estrogen.  [place] ordered to evaluate ovaries and to evaluate the endometrium prior to seeing Dr. Denman George.

## 2017-12-11 ENCOUNTER — Ambulatory Visit (HOSPITAL_COMMUNITY)
Admission: RE | Admit: 2017-12-11 | Discharge: 2017-12-11 | Disposition: A | Discharge status: Home / Self Care | Payer: 59 | Source: Ambulatory Visit | Attending: Gynecologic Oncology | Admitting: Gynecologic Oncology

## 2017-12-11 ENCOUNTER — Other Ambulatory Visit: Payer: Self-pay | Admitting: Gynecologic Oncology

## 2017-12-11 DIAGNOSIS — N83202 Unspecified ovarian cyst, left side: Secondary | ICD-10-CM | POA: Diagnosis not present

## 2017-12-11 DIAGNOSIS — R9389 Abnormal findings on diagnostic imaging of other specified body structures: Secondary | ICD-10-CM | POA: Diagnosis not present

## 2017-12-11 DIAGNOSIS — N950 Postmenopausal bleeding: Secondary | ICD-10-CM

## 2017-12-14 ENCOUNTER — Telehealth: Payer: Self-pay | Admitting: *Deleted

## 2017-12-14 ENCOUNTER — Encounter: Payer: Self-pay | Admitting: Gynecologic Oncology

## 2017-12-14 ENCOUNTER — Inpatient Hospital Stay: Payer: 59 | Attending: Gynecologic Oncology | Admitting: Gynecologic Oncology

## 2017-12-14 VITALS — BP 125/73 | HR 71 | Temp 97.9°F | Resp 18 | Ht 65.0 in | Wt 143.0 lb

## 2017-12-14 DIAGNOSIS — N800 Endometriosis of uterus: Secondary | ICD-10-CM | POA: Diagnosis not present

## 2017-12-14 DIAGNOSIS — G35 Multiple sclerosis: Secondary | ICD-10-CM | POA: Insufficient documentation

## 2017-12-14 DIAGNOSIS — R9389 Abnormal findings on diagnostic imaging of other specified body structures: Secondary | ICD-10-CM | POA: Diagnosis not present

## 2017-12-14 DIAGNOSIS — N939 Abnormal uterine and vaginal bleeding, unspecified: Secondary | ICD-10-CM | POA: Insufficient documentation

## 2017-12-14 DIAGNOSIS — N809 Endometriosis, unspecified: Secondary | ICD-10-CM

## 2017-12-14 DIAGNOSIS — N8003 Adenomyosis of the uterus: Secondary | ICD-10-CM

## 2017-12-14 NOTE — Patient Instructions (Signed)
Preparing for your Surgery  Plan for surgery on Jan 12, 2018 with Dr. Everitt Amber at [facility] will be scheduled for robotic assisted total hysterectomy, bilateral salpingectomy.  Pre-operative Testing -You will receive a phone call from presurgical testing at [facility] to arrange for a pre-operative testing appointment before your surgery.  This appointment normally occurs one to two weeks before your scheduled surgery.   -Bring your insurance card, copy of an advanced directive if applicable, medication list  -At that visit, you will be asked to sign a consent for a possible blood transfusion in case a transfusion becomes necessary during surgery.  The need for a blood transfusion is rare but having consent is a necessary part of your care.     -You should not be taking blood thinners or aspirin at least ten days prior to surgery unless instructed by your surgeon.  Day Before Surgery at [place] will be asked to take in a light diet the day before surgery.  Avoid carbonated beverages.  You will be advised to have nothing to eat or drink after midnight the evening before.    Eat a light diet the day before surgery.  Examples including soups, broths, toast, yogurt, mashed potatoes.  Things to avoid include carbonated beverages (fizzy beverages), raw fruits and raw vegetables, or beans.   If your bowels are filled with gas, your surgeon will have difficulty visualizing your pelvic organs which increases your surgical risks.  Your role in recovery Your role is to become active as soon as directed by your doctor, while still giving yourself time to heal.  Rest when you feel tired. You will be asked to do the following in order to speed your recovery:  - Cough and breathe deeply. This helps toclear and expand your lungs and can prevent pneumonia. You may be given a spirometer to practice deep breathing. A staff member will show you how to use the  spirometer. - Do mild physical activity. Walking or moving your legs help your circulation and body functions return to normal. A staff member will help you when you try to walk and will provide you with simple exercises. Do not try to get up or walk alone the first time. - Actively manage your pain. Managing your pain lets you move in comfort. We will ask you to rate your pain on a scale of zero to 10. It is your responsibility to tell your doctor or nurse where and how much you hurt so your pain can be treated.  Special Considerations -If you are diabetic, you may be placed on insulin after surgery to have closer control over your blood sugars to promote healing and recovery.  This does not mean that you will be discharged on insulin.  If applicable, your oral antidiabetics will be resumed when you are tolerating a solid diet.  -Your final pathology results from surgery should be available by the Friday after surgery and the results will be relayed to you when available.  -Dr. Lahoma Crocker is the Surgeon that assists your GYN Oncologist with surgery.  The next day after your surgery you will either see your GYN Oncologist or Dr. Lahoma Crocker.   Blood Transfusion Information WHAT IS A BLOOD TRANSFUSION? A transfusion is the replacement of blood or some of its parts. Blood is made up of multiple cells which provide different functions.  Red blood cells carry oxygen and are used for blood loss replacement.  White blood  cells fight against infection.  Platelets control bleeding.  Plasma helps clot blood.  Other blood products are available for specialized needs, such as hemophilia or other clotting disorders. BEFORE THE TRANSFUSION  Who gives blood for transfusions?   You may be able to donate blood to be used at a later date on yourself (autologous donation).  Relatives can be asked to donate blood. This is generally not any safer than if you have received blood from a  stranger. The same precautions are taken to ensure safety when a relative's blood is donated.  Healthy volunteers who are fully evaluated to make sure their blood is safe. This is blood bank blood. Transfusion therapy is the safest it has ever been in the practice of medicine. Before blood is taken from a donor, a complete history is taken to make sure that person has no history of diseases nor engages in risky social behavior (examples are intravenous drug use or sexual activity with multiple partners). The donor's travel history is screened to minimize risk of transmitting infections, such as malaria. The donated blood is tested for signs of infectious diseases, such as HIV and hepatitis. The blood is then tested to be sure it is compatible with you in order to minimize the chance of a transfusion reaction. If you or a relative donates blood, this is often done in anticipation of surgery and is not appropriate for emergency situations. It takes many days to process the donated blood. RISKS AND COMPLICATIONS Although transfusion therapy is very safe and saves many lives, the main dangers of transfusion include:   Getting an infectious disease.  Developing a transfusion reaction. This is an allergic reaction to something in the blood you were given. Every precaution is taken to prevent this. The decision to have a blood transfusion has been considered carefully by your caregiver before blood is given. Blood is not given unless the benefits outweigh the risks.

## 2017-12-14 NOTE — Telephone Encounter (Signed)
Open by mistake

## 2017-12-14 NOTE — Progress Notes (Signed)
New Patient Note: Gyn-Onc   [name] [name] 47 y.o. female  CC:  Chief Complaint  Patient presents with  . abnormal uterine bleeding    Assessment/Plan:  [name]  is a 47 y.o.  year old with symptoms of abnormal uterine bleeding in the setting of unopposed estrogen. Her uterus has the appearance of adenomyosis on ultrasound.  Given her intolerance/allergy to progesterone, she is not a good candidate for medical therapy. Adenomyosis tends to poorly respond to therapies such as ablation.  Therefore, I am recommending robotic total hysterectomy and bilateral salpingectomy.  We willl follow-up the results of today's biopsy to ensure that no malignancy is found that would require staging.  I discussed operative risks including  bleeding, infection, damage to internal organs (such as bladder,ureters, bowels), blood clot, reoperation and rehospitalization. I discussed that MS can flare with perioperative trauma (though can also flare from symptoms of uncontrolled bleeding).  I discussed restrictions postop.   HPI: [name] [name] is a 47 year old P2 who is seen for abnormal uterine bleeding and thickened endometrium on [place].  The patient has a history of irregular menses since June, 2018. It spaced for several weeks and then became extremely heavy vaginal bleeding in the first week of April, 2019 passing clots.  TVUS was performed on 12/11/17 which showed a uterus measuring 7.8x5x5.7cm with retroversion and no fibroids. Mildly heterogeneous myometrial echotexture consistent with adenomyosis. 2+cm anechoic cyst on left ovary.   She was started on estrogen/progesterone HRT for symptoms of menopause at approximately age 67. She developed rash to the progesterone. She tried introducing it back, however always developed the rash. Instead she continued with single agent estrogen which did control her symptoms of menopause.  She has a history of MS since age 79. 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 Stress, decreased sleep and febrile morbidity triggers flares.  She is otherwise very health.  Her surgical history is significant for an uncomplicated laparoscopic cholecystectomy in 2008. She has a history of a cesarean section and a bunionectomy.   Her family history is significant for a mother who developed uterine cancer in 1992 and was treated surgically without needing adjuvant therapy. She has no family history of colon, breast or ovarian cancers.  She has had one vaginal delivery and one cesarean section.    Current Meds:  Outpatient Encounter Medications as of 12/14/2017  Medication Sig  . amantadine (SYMMETREL) 100 MG capsule Take 1 capsule (100 mg total) by mouth 2 (two) times daily.  . cetirizine (ZYRTEC) 10 MG tablet Take 10 mg by mouth at bedtime. As needed at night  . cyclobenzaprine (FLEXERIL) 10 MG tablet Take 10 mg by mouth at bedtime.  . hydrOXYzine (ATARAX/VISTARIL) 25 MG tablet Take 25 mg by mouth at bedtime.  . methylphenidate (RITALIN) 10 MG tablet Take 10 mg by mouth 2 (two) times daily.  Marland Kitchen MINIVELLE 0.1 MG/24HR patch Every 4th day.   No facility-administered encounter medications on file as of 12/14/2017.     Allergy:  Allergies  Allergen Reactions  . Augmentin [Amoxicillin-Pot Clavulanate] Swelling    tongue swelling  . Venlafaxine Hives    blisters  . Demerol [Meperidine] Rash  . Estrogens Rash    The patient can use patch,No oral  . Gabapentin Rash  . Stadol [Butorphanol] Rash    Social Hx:   Social History   Socioeconomic History  . Marital status: Divorced  Spouse name: Not on file  . Number of children: 2  . Years of education: Not on file  . Highest education level: Not on file  Occupational History  . Not on file  Social Needs  . Financial resource strain: Not on file  . Food insecurity:    Worry: Not on file    Inability: Not on file  . Transportation  needs:    Medical: Not on file    Non-medical: Not on file  Tobacco Use  . Smoking status: Never Smoker  . Smokeless tobacco: Never Used  Substance and Sexual Activity  . Alcohol use: Yes    Alcohol/week: 0.0 oz    Comment: rarely  . Drug use: No  . Sexual activity: Yes  Lifestyle  . Physical activity:    Days per week: Not on file    Minutes per session: Not on file  . Stress: Not on file  Relationships  . Social connections:    Talks on phone: Not on file    Gets together: Not on file    Attends religious service: Not on file    Active member of club or organization: Not on file    Attends meetings of clubs or organizations: Not on file    Relationship status: Not on file  . Intimate partner violence:    Fear of current or ex partner: Not on file    Emotionally abused: Not on file    Physically abused: Not on file    Forced sexual activity: Not on file  Other Topics Concern  . Not on file  Social History Narrative  . Not on file    Past Surgical Hx:  Past Surgical History:  Procedure Laterality Date  . adnoidectomy    . BUNIONECTOMY Left    foot  . CESAREAN SECTION    . CHOLECYSTECTOMY    . wisdom teeth exraction      Past Medical Hx:  Past Medical History:  Diagnosis Date  . Allergy   . Anxiety   . Cancer ([place])    basal cell skin ca  . Depression   . Multiple sclerosis ([place])   . Neuromuscular disorder ([place])    multiple sclerosis  . Vision abnormalities     Past Gynecological History:  C/s x 1 and vaginal delivery x 1 Patient's last menstrual period was 07/13/2015 (exact date).  Family Hx:  Family History  Problem Relation Age of Onset  . Cervical cancer Mother   . Dementia Mother   . Melanoma Father   . Ulcerative colitis Sister   . Colon cancer Neg Hx   . Esophageal cancer Neg Hx   . Rectal cancer Neg Hx   . Stomach cancer Neg Hx     Review of Systems:  Constitutional  Feels well,    ENT Normal appearing ears and nares  bilaterally Skin/Breast  No rash, sores, jaundice, itching, dryness Cardiovascular  No chest pain, shortness of breath, or edema  Pulmonary  No cough or wheeze.  Gastro Intestinal  No nausea, vomitting, or diarrhoea. No bright red blood per rectum, no abdominal pain, change in bowel movement, or constipation.  Genito Urinary  No frequency, urgency, dysuria,       Musculo Skeletal  No myalgia, arthralgia, joint swelling or pain  Neurologic  No weakness, numbness, change in gait,  Psychology  No depression, anxiety, insomnia.   Vitals:  Blood pressure 125/73, pulse 71, temperature 97.9 F (36.6 C), temperature source Oral, resp. rate  18, height 5\' 5"  (1.651 m), weight 143 lb (64.9 kg), last menstrual period 07/13/2015, SpO2 100 %.  Physical Exam: WD in NAD Neck  Supple NROM, without any enlargements.  Lymph Node Survey No cervical supraclavicular or inguinal adenopathy Cardiovascular  Pulse normal rate, regularity and rhythm. S1 and S2 normal.  Lungs  Clear to auscultation bilateraly, without wheezes/crackles/rhonchi. Good air movement.  Skin  No rash/lesions/breakdown  Psychiatry  Alert and oriented to person, place, and time  Abdomen  Normoactive bowel sounds, abdomen soft, non-tender and thin without evidence of hernia.  Back No CVA tenderness Genito Urinary  Vulva/vagina: Normal external female genitalia.   No lesions. No discharge or bleeding.  Bladder/urethra:  No lesions or masses, well supported bladder  Vagina: normal  Cervix: Normal appearing, no lesions.  Uterus:  Small, mobile, no parametrial involvement or nodularity.  Adnexa: no palpable masses. Rectal  deferred Extremities  No bilateral cyanosis, clubbing or edema.  Endometrial Biopsy Procedure Note Preop Dx: abnormal uterine bleeding, thickened endometrium on [place] Postop Dx: same Procedure: endometrial biopsy Surgeon: Everitt Amber EBL: minimal Complications: none Specimen: endometrial biopsy to  pathology Procedure: patient was verbally consented, verbal time out performed. Cervix was visualized with speculum. Grasped with tenaculum. Os finder used to dilate ectocervix. Pipelle passed to 7cm. Moderate tissue extracted with one pass. Cramping pain but overall patient tolerated procedure well.    Thereasa Solo, MD  12/14/2017, 4:50 PM

## 2017-12-22 DIAGNOSIS — Z1231 Encounter for screening mammogram for malignant neoplasm of breast: Secondary | ICD-10-CM | POA: Diagnosis not present

## 2017-12-31 MED FILL — ESTRADIOL 0.1 MG/24HR PTTW: 0.1 | 28 days supply | Qty: 8 | Fill #6

## 2018-01-11 NOTE — Patient Instructions (Addendum)
[name] [name]  01/11/2018   Your procedure is scheduled on: Tuesday 01/19/2018  Report to [facility] Main  Entrance              Report to admitting at  0530  AM    Call this number if you have problems the morning of surgery [phone]   Eat a light diet the day before surgery.  Examples including soups, broths, toast, yogurt, mashed potatoes.  Things to avoid include carbonated beverages (fizzy beverages), raw fruits and raw vegetables, or beans.   If your bowels are filled with gas, your surgeon will have difficulty visualizing your pelvic organs which increases your surgical risks.  Only clear liquids after midnight. Nothing by mouth 3 hours prior to scheduled surgery.   Please finish carbohydrate drink 3 hours prior to surgery at 0430 am!    [place] Allowed                                                                     Foods Excluded  Coffee and tea, regular and decaf                             liquids that you cannot  Plain Jell-O in any flavor                                             see through such as: Fruit ices (not with fruit pulp)                                     milk, soups, orange juice  Iced Popsicles                                    All solid food Carbonated beverages, regular and diet                                    Cranberry, grape and apple juices Sports drinks like Gatorade Lightly seasoned clear broth or consume(fat free) Sugar, honey syrup  Sample Menu Breakfast                                Lunch                                     Supper Cranberry juice                    Beef broth  Chicken broth Jell-O                                     Grape juice                           Apple juice Coffee or tea                        Jell-O                                      Popsicle                                                Coffee or tea                         Coffee or tea  _____________________________________________________________________      Remember: Do not eat food  :After Midnight.     Take these medicines the morning of surgery with A SIP OF WATER: Amantadine if needed for MS                                You may not have any metal on your body including hair pins and              piercings  Do not wear jewelry, make-up, lotions, powders or perfumes, deodorant             Do not wear nail polish.  Do not shave  48 hours prior to surgery.               Do not bring valuables to the hospital. [name] [name].  Contacts, dentures or bridgework may not be worn into surgery.  Leave suitcase in the car. After surgery it may be brought to your room.                  Please read over the following fact sheets you were given: _____________________________________________________________________             [facility] - Preparing for Surgery Before surgery, you can play an important role.  Because skin is not sterile, your skin needs to be as free of germs as possible.  You can reduce the number of germs on your skin by washing with CHG (chlorahexidine gluconate) soap before surgery.  CHG is an antiseptic cleaner which kills germs and bonds with the skin to continue killing germs even after washing. Please DO NOT use if you have an allergy to CHG or antibacterial soaps.  If your skin becomes reddened/irritated stop using the CHG and inform your nurse when you arrive at Short Stay. Do not shave (including legs and underarms) for at least 48 hours prior to the first CHG shower.  You may shave your face/neck. Please follow these instructions carefully:  1.  Shower with CHG Soap the night before surgery and the  morning of Surgery.  2.  If you choose to wash your hair, wash your hair first as usual with your  normal  shampoo.  3.  After you shampoo, rinse your hair and body thoroughly to  remove the  shampoo.                           4.  Use CHG as you would any other liquid soap.  You can apply chg directly  to the skin and wash                       Gently with a scrungie or clean washcloth.  5.  Apply the CHG Soap to your body ONLY FROM THE NECK DOWN.   Do not use on face/ open                           Wound or open sores. Avoid contact with eyes, ears mouth and genitals (private parts).                       Wash face,  Genitals (private parts) with your normal soap.             6.  Wash thoroughly, paying special attention to the area where your surgery  will be performed.  7.  Thoroughly rinse your body with warm water from the neck down.  8.  DO NOT shower/wash with your normal soap after using and rinsing off  the CHG Soap.                9.  Pat yourself dry with a clean towel.            10.  Wear clean pajamas.            11.  Place clean sheets on your bed the night of your first shower and do not  sleep with pets. Day of Surgery : Do not apply any lotions/deodorants the morning of surgery.  Please wear clean clothes to the hospital/surgery center.  FAILURE TO FOLLOW THESE INSTRUCTIONS MAY RESULT IN THE CANCELLATION OF YOUR SURGERY PATIENT SIGNATURE_________________________________  NURSE SIGNATURE__________________________________  ________________________________________________________________________   [name] [name]  An incentive spirometer is a tool that can help keep your lungs clear and active. This tool measures how well you are filling your lungs with each breath. Taking long deep breaths may help reverse or decrease the chance of developing breathing (pulmonary) problems (especially infection) following:  A long period of time when you are unable to move or be active. BEFORE THE PROCEDURE   If the spirometer includes an indicator to show your best effort, your nurse or respiratory therapist will set it to a desired goal.  If possible, sit  up straight or lean slightly forward. Try not to slouch.  Hold the incentive spirometer in an upright position. INSTRUCTIONS FOR USE  1. Sit on the edge of your bed if possible, or sit up as far as you can in bed or on a chair. 2. Hold the incentive spirometer in an upright position. 3. Breathe out normally. 4. Place the mouthpiece in your mouth and seal your lips tightly around it. 5. Breathe in slowly and as deeply as possible, raising the piston or the ball toward the top of the column. 6. Hold your breath for 3-5 seconds or for as long as  possible. Allow the piston or ball to fall to the bottom of the column. 7. Remove the mouthpiece from your mouth and breathe out normally. 8. Rest for a few seconds and repeat Steps 1 through 7 at least 10 times every 1-2 hours when you are awake. Take your time and take a few normal breaths between deep breaths. 9. The spirometer may include an indicator to show your best effort. Use the indicator as a goal to work toward during each repetition. 10. After each set of 10 deep breaths, practice coughing to be sure your lungs are clear. If you have an incision (the cut made at the time of surgery), support your incision when coughing by placing a pillow or rolled up towels firmly against it. Once you are able to get out of bed, walk around indoors and cough well. You may stop using the incentive spirometer when instructed by your caregiver.  RISKS AND COMPLICATIONS  Take your time so you do not get dizzy or light-headed.  If you are in pain, you may need to take or ask for pain medication before doing incentive spirometry. It is harder to take a deep breath if you are having pain. AFTER USE  Rest and breathe slowly and easily.  It can be helpful to keep track of a log of your progress. Your caregiver can provide you with a simple table to help with this. If you are using the spirometer at home, follow these instructions: [place] IF:   You are  having difficultly using the spirometer.  You have trouble using the spirometer as often as instructed.  Your pain medication is not giving enough relief while using the spirometer.  You develop fever of 100.5 F (38.1 C) or higher. SEEK IMMEDIATE MEDICAL CARE IF:   You cough up bloody sputum that had not been present before.  You develop fever of 102 F (38.9 C) or greater.  You develop worsening pain at or near the incision site. MAKE SURE YOU:   Understand these instructions.  Will watch your condition.  Will get help right away if you are not doing well or get worse. Document Released: 01/05/2007 Document Revised: 11/17/2011 Document Reviewed: 03/08/2007 Exit[employer] Patient Information 2014 Exit[employer], [place].   ________________________________________________________________________  WHAT IS A BLOOD TRANSFUSION? Blood Transfusion Information  A transfusion is the replacement of blood or some of its parts. Blood is made up of multiple cells which provide different functions.  Red blood cells carry oxygen and are used for blood loss replacement.  White blood cells fight against infection.  Platelets control bleeding.  Plasma helps clot blood.  Other blood products are available for specialized needs, such as hemophilia or other clotting disorders. BEFORE THE TRANSFUSION  Who gives blood for transfusions?   Healthy volunteers who are fully evaluated to make sure their blood is safe. This is blood bank blood. Transfusion therapy is the safest it has ever been in the practice of medicine. Before blood is taken from a donor, a complete history is taken to make sure that person has no history of diseases nor engages in risky social behavior (examples are intravenous drug use or sexual activity with multiple partners). The donor's travel history is screened to minimize risk of transmitting infections, such as malaria. The donated blood is tested for signs of infectious diseases,  such as HIV and hepatitis. The blood is then tested to be sure it is compatible with you in order to minimize the chance of a  transfusion reaction. If you or a relative donates blood, this is often done in anticipation of surgery and is not appropriate for emergency situations. It takes many days to process the donated blood. RISKS AND COMPLICATIONS Although transfusion therapy is very safe and saves many lives, the main dangers of transfusion include:   Getting an infectious disease.  Developing a transfusion reaction. This is an allergic reaction to something in the blood you were given. Every precaution is taken to prevent this. The decision to have a blood transfusion has been considered carefully by your caregiver before blood is given. Blood is not given unless the benefits outweigh the risks. AFTER THE TRANSFUSION  Right after receiving a blood transfusion, you will usually feel much better and more energetic. This is especially true if your red blood cells have gotten low (anemic). The transfusion raises the level of the red blood cells which carry oxygen, and this usually causes an energy increase.  The nurse administering the transfusion will monitor you carefully for complications. HOME CARE INSTRUCTIONS  No special instructions are needed after a transfusion. You may find your energy is better. Speak with your caregiver about any limitations on activity for underlying diseases you may have. SEEK MEDICAL CARE IF:   Your condition is not improving after your transfusion.  You develop redness or irritation at the intravenous (IV) site. SEEK IMMEDIATE MEDICAL CARE IF:  Any of the following symptoms occur over the next 12 hours:  Shaking chills.  You have a temperature by mouth above 102 F (38.9 C), not controlled by medicine.  Chest, back, or muscle pain.  People around you feel you are not acting correctly or are confused.  Shortness of breath or difficulty  breathing.  Dizziness and fainting.  You get a rash or develop hives.  You have a decrease in urine output.  Your urine turns a dark color or changes to pink, red, or brown. Any of the following symptoms occur over the next 10 days:  You have a temperature by mouth above 102 F (38.9 C), not controlled by medicine.  Shortness of breath.  Weakness after normal activity.  The white part of the eye turns yellow (jaundice).  You have a decrease in the amount of urine or are urinating less often.  Your urine turns a dark color or changes to pink, red, or brown. Document Released: 08/22/2000 Document Revised: 11/17/2011 Document Reviewed: 04/10/2008 [facility] Patient Information 2014 [place], [place].  _______________________________________________________________________

## 2018-01-13 ENCOUNTER — Encounter (HOSPITAL_COMMUNITY): Payer: Self-pay

## 2018-01-13 ENCOUNTER — Encounter (HOSPITAL_COMMUNITY)
Admission: RE | Admit: 2018-01-13 | Discharge: 2018-01-13 | Disposition: A | Discharge status: Home / Self Care | Payer: 59 | Source: Ambulatory Visit | Attending: Gynecologic Oncology | Admitting: Gynecologic Oncology

## 2018-01-13 ENCOUNTER — Other Ambulatory Visit: Payer: Self-pay

## 2018-01-13 DIAGNOSIS — Z01812 Encounter for preprocedural laboratory examination: Secondary | ICD-10-CM | POA: Insufficient documentation

## 2018-01-13 DIAGNOSIS — N800 Endometriosis of uterus: Secondary | ICD-10-CM | POA: Insufficient documentation

## 2018-01-13 DIAGNOSIS — Z0183 Encounter for blood typing: Secondary | ICD-10-CM | POA: Insufficient documentation

## 2018-01-13 HISTORY — DX: Family history of other specified conditions: Z84.89

## 2018-01-13 LAB — URINALYSIS, ROUTINE W REFLEX MICROSCOPIC
BILIRUBIN URINE: NEGATIVE
Bacteria, UA: NONE SEEN
GLUCOSE, UA: NEGATIVE mg/dL
KETONES UR: NEGATIVE mg/dL
LEUKOCYTES UA: NEGATIVE
Nitrite: NEGATIVE
PH: 5 (ref 5.0–8.0)
Protein, ur: NEGATIVE mg/dL
Specific Gravity, Urine: 1.024 (ref 1.005–1.030)

## 2018-01-13 LAB — COMPREHENSIVE METABOLIC PANEL
ALT: 24 U/L (ref 14–54)
AST: 18 U/L (ref 15–41)
Albumin: 4.3 g/dL (ref 3.5–5.0)
Alkaline Phosphatase: 71 U/L (ref 38–126)
Anion gap: 8 (ref 5–15)
BUN: 18 mg/dL (ref 6–20)
CHLORIDE: 104 mmol/L (ref 101–111)
CO2: 28 mmol/L (ref 22–32)
CREATININE: 0.67 mg/dL (ref 0.44–1.00)
Calcium: 9.2 mg/dL (ref 8.9–10.3)
GFR calc Af Amer: >60 mL/min (ref >60)
GLUCOSE: 91 mg/dL (ref 65–99)
Potassium: 3.9 mmol/L (ref 3.5–5.1)
SODIUM: 140 mmol/L (ref 135–145)
Total Bilirubin: 0.4 mg/dL (ref 0.3–1.2)
Total Protein: 7.6 g/dL (ref 6.5–8.1)

## 2018-01-13 LAB — CBC
HCT: 44.2 % (ref 36.0–46.0)
Hemoglobin: 14.7 g/dL (ref 12.0–15.0)
MCH: 30 pg (ref 26.0–34.0)
MCHC: 33.3 g/dL (ref 30.0–36.0)
MCV: 90.2 fL (ref 78.0–100.0)
Platelets: 333 10*3/uL (ref 150–400)
RBC: 4.9 MIL/uL (ref 3.87–5.11)
RDW: 13 % (ref 11.5–15.5)
WBC: 6.5 10*3/uL (ref 4.0–10.5)

## 2018-01-13 LAB — PREGNANCY, URINE: PREG TEST UR: NEGATIVE

## 2018-01-13 LAB — ABO/RH: ABO/RH(D): A POS

## 2018-01-18 NOTE — Anesthesia Preprocedure Evaluation (Addendum)
Anesthesia Evaluation  Patient identified by MRN, date of birth, ID band Patient awake    Reviewed: Allergy & Precautions, NPO status , Patient's Chart, lab work & pertinent test results  History of Anesthesia Complications Negative for: history of anesthetic complications  Airway Mallampati: II  TM Distance: >3 FB Neck ROM: Full    Dental no notable dental hx. (+) Dental Advisory Given   Pulmonary neg pulmonary ROS,    Pulmonary exam normal        Cardiovascular negative cardio ROS Normal cardiovascular exam     Neuro/Psych PSYCHIATRIC DISORDERS Anxiety Depression MS    GI/Hepatic negative GI ROS, Neg liver ROS,   Endo/Other  negative endocrine ROS  Renal/GU negative Renal ROS     Musculoskeletal negative musculoskeletal ROS (+)   Abdominal   Peds  Hematology negative hematology ROS (+)   Anesthesia Other Findings Day of surgery medications reviewed with the patient.  Reproductive/Obstetrics                            Anesthesia Physical Anesthesia Plan  ASA: II  Anesthesia Plan: General   Post-op Pain Management:    Induction: Intravenous  PONV Risk Score and Plan: 4 or greater and Ondansetron, Dexamethasone, Scopolamine patch - Pre-op and Diphenhydramine  Airway Management Planned: Oral ETT  Additional Equipment:   Intra-op Plan:   Post-operative Plan: Extubation in OR  Informed Consent: I have reviewed the patients History and Physical, chart, labs and discussed the procedure including the risks, benefits and alternatives for the proposed anesthesia with the patient or authorized representative who has indicated his/her understanding and acceptance.   Dental advisory given  Plan Discussed with: CRNA and Anesthesiologist  Anesthesia Plan Comments:        Anesthesia Quick Evaluation

## 2018-01-19 ENCOUNTER — Ambulatory Visit (HOSPITAL_COMMUNITY): Payer: 59 | Admitting: Anesthesiology

## 2018-01-19 ENCOUNTER — Encounter (HOSPITAL_COMMUNITY): Payer: Self-pay

## 2018-01-19 ENCOUNTER — Ambulatory Visit (HOSPITAL_COMMUNITY)
Admission: RE | Admit: 2018-01-19 | Discharge: 2018-01-20 | Disposition: A | Discharge status: Home / Self Care | Payer: 59 | Source: Ambulatory Visit | Attending: Gynecologic Oncology | Admitting: Gynecologic Oncology

## 2018-01-19 ENCOUNTER — Encounter (HOSPITAL_COMMUNITY)
Admission: RE | Disposition: A | Discharge status: Home / Self Care | Payer: Self-pay | Source: Ambulatory Visit | Attending: Gynecologic Oncology

## 2018-01-19 DIAGNOSIS — N8000 Endometriosis of the uterus, unspecified: Secondary | ICD-10-CM

## 2018-01-19 DIAGNOSIS — G35 Multiple sclerosis: Secondary | ICD-10-CM | POA: Diagnosis not present

## 2018-01-19 DIAGNOSIS — Z888 Allergy status to other drugs, medicaments and biological substances status: Secondary | ICD-10-CM | POA: Insufficient documentation

## 2018-01-19 DIAGNOSIS — Z79899 Other long term (current) drug therapy: Secondary | ICD-10-CM | POA: Diagnosis not present

## 2018-01-19 DIAGNOSIS — Z8049 Family history of malignant neoplasm of other genital organs: Secondary | ICD-10-CM | POA: Insufficient documentation

## 2018-01-19 DIAGNOSIS — D271 Benign neoplasm of left ovary: Secondary | ICD-10-CM | POA: Diagnosis not present

## 2018-01-19 DIAGNOSIS — H469 Unspecified optic neuritis: Secondary | ICD-10-CM | POA: Diagnosis not present

## 2018-01-19 DIAGNOSIS — Z85828 Personal history of other malignant neoplasm of skin: Secondary | ICD-10-CM | POA: Diagnosis not present

## 2018-01-19 DIAGNOSIS — N939 Abnormal uterine and vaginal bleeding, unspecified: Secondary | ICD-10-CM | POA: Insufficient documentation

## 2018-01-19 DIAGNOSIS — N809 Endometriosis, unspecified: Secondary | ICD-10-CM

## 2018-01-19 DIAGNOSIS — N8003 Adenomyosis of the uterus: Secondary | ICD-10-CM

## 2018-01-19 DIAGNOSIS — R110 Nausea: Secondary | ICD-10-CM

## 2018-01-19 DIAGNOSIS — Z9889 Other specified postprocedural states: Secondary | ICD-10-CM

## 2018-01-19 HISTORY — PX: ROBOTIC ASSISTED LAPAROSCOPIC HYSTERECTOMY AND SALPINGECTOMY: SHX6379

## 2018-01-19 LAB — TYPE AND SCREEN
ABO/RH(D): A POS
Antibody Screen: NEGATIVE

## 2018-01-19 LAB — CBC
HCT: 42 % (ref 36.0–46.0)
HEMOGLOBIN: 14.1 g/dL (ref 12.0–15.0)
MCH: 29.6 pg (ref 26.0–34.0)
MCHC: 33.6 g/dL (ref 30.0–36.0)
MCV: 88.1 fL (ref 78.0–100.0)
Platelets: 309 10*3/uL (ref 150–400)
RBC: 4.77 MIL/uL (ref 3.87–5.11)
RDW: 12.4 % (ref 11.5–15.5)
WBC: 13.9 10*3/uL — AB (ref 4.0–10.5)

## 2018-01-19 SURGERY — XI ROBOTIC ASSISTED LAPAROSCOPIC HYSTERECTOMY AND SALPINGECTOMY
Anesthesia: General

## 2018-01-19 MED ORDER — ACETAMINOPHEN 500 MG PO TABS
1000.0000 mg | ORAL_TABLET | Freq: Four times a day (QID) | ORAL | Status: DC
  Administered 2018-01-19: 1000 mg via ORAL
  Filled 2018-01-19: qty 2

## 2018-01-19 MED ORDER — FENTANYL CITRATE (PF) 100 MCG/2ML IJ SOLN
INTRAMUSCULAR | Status: DC | PRN
  Administered 2018-01-19: 100 ug via INTRAVENOUS

## 2018-01-19 MED ORDER — PROPOFOL 10 MG/ML IV BOLUS
INTRAVENOUS | Status: AC
  Filled 2018-01-19: qty 20

## 2018-01-19 MED ORDER — SCOPOLAMINE 1 MG/3DAYS TD PT72: 1.0000 | MEDICATED_PATCH | TRANSDERMAL | Status: DC

## 2018-01-19 MED ORDER — OXYCODONE HCL 5 MG PO TABS
5.0000 mg | ORAL_TABLET | ORAL | Status: DC | PRN
  Administered 2018-01-20: 5 mg via ORAL
  Filled 2018-01-19: qty 1

## 2018-01-19 MED ORDER — ACETAMINOPHEN 500 MG PO TABS
1000.0000 mg | ORAL_TABLET | ORAL | Status: AC
  Administered 2018-01-19: 1000 mg via ORAL
  Filled 2018-01-19: qty 2

## 2018-01-19 MED ORDER — CIPROFLOXACIN IN D5W 400 MG/200ML IV SOLN
400.0000 mg | INTRAVENOUS | Status: AC
  Administered 2018-01-19: 400 mg via INTRAVENOUS

## 2018-01-19 MED ORDER — PHENYLEPHRINE 40 MCG/ML (10ML) SYRINGE FOR IV PUSH (FOR BLOOD PRESSURE SUPPORT)
PREFILLED_SYRINGE | INTRAVENOUS | Status: DC | PRN
  Administered 2018-01-19: 40 ug via INTRAVENOUS

## 2018-01-19 MED ORDER — LACTATED RINGERS IR SOLN
Status: DC | PRN
  Administered 2018-01-19: 1000 mL

## 2018-01-19 MED ORDER — SUGAMMADEX SODIUM 200 MG/2ML IV SOLN
INTRAVENOUS | Status: DC | PRN
  Administered 2018-01-19: 200 mg via INTRAVENOUS

## 2018-01-19 MED ORDER — ROCURONIUM BROMIDE 100 MG/10ML IV SOLN
INTRAVENOUS | Status: DC | PRN
  Administered 2018-01-19: 50 mg via INTRAVENOUS

## 2018-01-19 MED ORDER — SENNOSIDES-DOCUSATE SODIUM 8.6-50 MG PO TABS: 2.0000 | ORAL_TABLET | Freq: Every day | ORAL | Status: DC

## 2018-01-19 MED ORDER — KETOROLAC TROMETHAMINE 30 MG/ML IJ SOLN
30.0000 mg | Freq: Four times a day (QID) | INTRAMUSCULAR | Status: DC
  Administered 2018-01-19 – 2018-01-20 (×3): 30 mg via INTRAVENOUS
  Filled 2018-01-19 (×3): qty 1

## 2018-01-19 MED ORDER — CLINDAMYCIN PHOSPHATE 900 MG/50ML IV SOLN
INTRAVENOUS | Status: AC
  Filled 2018-01-19: qty 50

## 2018-01-19 MED ORDER — CLINDAMYCIN PHOSPHATE 900 MG/50ML IV SOLN
900.0000 mg | INTRAVENOUS | Status: AC
  Administered 2018-01-19: 900 mg via INTRAVENOUS

## 2018-01-19 MED ORDER — CIPROFLOXACIN IN D5W 400 MG/200ML IV SOLN
INTRAVENOUS | Status: AC
  Filled 2018-01-19: qty 200

## 2018-01-19 MED ORDER — ONDANSETRON HCL 4 MG PO TABS: 4.0000 mg | ORAL_TABLET | Freq: Four times a day (QID) | ORAL | Status: DC | PRN

## 2018-01-19 MED ORDER — ONDANSETRON HCL 4 MG/2ML IJ SOLN
INTRAMUSCULAR | Status: DC | PRN
  Administered 2018-01-19: 4 mg via INTRAVENOUS

## 2018-01-19 MED ORDER — DICLOFENAC SODIUM 50 MG PO TBEC
50.0000 mg | DELAYED_RELEASE_TABLET | Freq: Four times a day (QID) | ORAL | Status: DC
  Filled 2018-01-19 (×3): qty 1

## 2018-01-19 MED ORDER — KETAMINE HCL 10 MG/ML IJ SOLN
INTRAMUSCULAR | Status: DC | PRN
  Administered 2018-01-19: 25 mg via INTRAVENOUS

## 2018-01-19 MED ORDER — CELECOXIB 200 MG PO CAPS
400.0000 mg | ORAL_CAPSULE | ORAL | Status: AC
  Administered 2018-01-19: 400 mg via ORAL
  Filled 2018-01-19: qty 2

## 2018-01-19 MED ORDER — PROCHLORPERAZINE EDISYLATE 10 MG/2ML IJ SOLN
5.0000 mg | INTRAMUSCULAR | Status: DC | PRN
  Administered 2018-01-19: 5 mg via INTRAVENOUS
  Filled 2018-01-19: qty 2

## 2018-01-19 MED ORDER — FENTANYL CITRATE (PF) 100 MCG/2ML IJ SOLN
INTRAMUSCULAR | Status: AC
  Administered 2018-01-19: 25 ug via INTRAVENOUS
  Filled 2018-01-19: qty 2

## 2018-01-19 MED ORDER — FENTANYL CITRATE (PF) 250 MCG/5ML IJ SOLN
INTRAMUSCULAR | Status: AC
  Filled 2018-01-19: qty 5

## 2018-01-19 MED ORDER — MIDAZOLAM HCL 5 MG/5ML IJ SOLN
INTRAMUSCULAR | Status: DC | PRN
  Administered 2018-01-19: 2 mg via INTRAVENOUS

## 2018-01-19 MED ORDER — PROMETHAZINE HCL 25 MG/ML IJ SOLN: 6.2500 mg | INTRAMUSCULAR | Status: DC | PRN

## 2018-01-19 MED ORDER — DEXAMETHASONE SODIUM PHOSPHATE 4 MG/ML IJ SOLN
4.0000 mg | INTRAMUSCULAR | Status: AC
  Administered 2018-01-19: 10 mg via INTRAVENOUS

## 2018-01-19 MED ORDER — ONDANSETRON HCL 4 MG/2ML IJ SOLN
4.0000 mg | Freq: Four times a day (QID) | INTRAMUSCULAR | Status: DC | PRN
  Administered 2018-01-19: 4 mg via INTRAVENOUS
  Filled 2018-01-19: qty 2

## 2018-01-19 MED ORDER — EPHEDRINE SULFATE-NACL 50-0.9 MG/10ML-% IV SOSY
PREFILLED_SYRINGE | INTRAVENOUS | Status: DC | PRN
  Administered 2018-01-19 (×2): 5 mg via INTRAVENOUS

## 2018-01-19 MED ORDER — LIDOCAINE 2% (20 MG/ML) 5 ML SYRINGE
INTRAMUSCULAR | Status: DC | PRN
  Administered 2018-01-19: 1.5 mg/kg/h via INTRAVENOUS

## 2018-01-19 MED ORDER — LIDOCAINE HCL (CARDIAC) PF 100 MG/5ML IV SOSY
PREFILLED_SYRINGE | INTRAVENOUS | Status: DC | PRN
  Administered 2018-01-19: 100 mg via INTRAVENOUS

## 2018-01-19 MED ORDER — ONDANSETRON HCL 4 MG/2ML IJ SOLN
INTRAMUSCULAR | Status: AC
  Filled 2018-01-19: qty 2

## 2018-01-19 MED ORDER — MIDAZOLAM HCL 2 MG/2ML IJ SOLN
INTRAMUSCULAR | Status: AC
  Filled 2018-01-19: qty 2

## 2018-01-19 MED ORDER — PROPOFOL 10 MG/ML IV BOLUS
INTRAVENOUS | Status: DC | PRN
  Administered 2018-01-19: 200 mg via INTRAVENOUS

## 2018-01-19 MED ORDER — FENTANYL CITRATE (PF) 100 MCG/2ML IJ SOLN
25.0000 ug | INTRAMUSCULAR | Status: DC | PRN
  Administered 2018-01-19: 50 ug via INTRAVENOUS
  Administered 2018-01-19: 25 ug via INTRAVENOUS

## 2018-01-19 MED ORDER — HYDROMORPHONE HCL 1 MG/ML IJ SOLN
0.2000 mg | INTRAMUSCULAR | Status: DC | PRN
  Administered 2018-01-19 (×2): 0.5 mg via INTRAVENOUS
  Filled 2018-01-19 (×2): qty 1

## 2018-01-19 MED ORDER — KETAMINE HCL 10 MG/ML IJ SOLN
INTRAMUSCULAR | Status: AC
  Filled 2018-01-19: qty 1

## 2018-01-19 MED ORDER — DICLOFENAC SODIUM 50 MG PO TBEC: 50.0000 mg | DELAYED_RELEASE_TABLET | Freq: Four times a day (QID) | ORAL | Status: DC

## 2018-01-19 MED ORDER — LACTATED RINGERS IV SOLN
INTRAVENOUS | Status: DC | PRN
  Administered 2018-01-19 (×2): via INTRAVENOUS

## 2018-01-19 MED ORDER — KCL IN DEXTROSE-NACL 20-5-0.45 MEQ/L-%-% IV SOLN
INTRAVENOUS | Status: DC
  Administered 2018-01-19 – 2018-01-20 (×2): via INTRAVENOUS
  Filled 2018-01-19 (×2): qty 1000

## 2018-01-19 MED ORDER — SUGAMMADEX SODIUM 200 MG/2ML IV SOLN
INTRAVENOUS | Status: AC
  Filled 2018-01-19: qty 2

## 2018-01-19 MED ORDER — SCOPOLAMINE 1 MG/3DAYS TD PT72
1.0000 | MEDICATED_PATCH | TRANSDERMAL | Status: DC
  Administered 2018-01-19: 1.5 mg via TRANSDERMAL
  Filled 2018-01-19: qty 1

## 2018-01-19 SURGICAL SUPPLY — 60 items
ADH SKN CLS APL DERMABOND .7 (GAUZE/BANDAGES/DRESSINGS) ×1
BAG LAPAROSCOPIC 12 15 PORT 16 (BASKET) IMPLANT
BAG RETRIEVAL 12/15 (BASKET)
BAG SPEC RTRVL LRG 6X4 10 (ENDOMECHANICALS)
CHLORAPREP W/TINT 26ML (MISCELLANEOUS) ×1 IMPLANT
COVER SURGICAL LIGHT HANDLE (MISCELLANEOUS) ×1 IMPLANT
COVER TIP SHEARS 8 DVNC (MISCELLANEOUS) ×1 IMPLANT
COVER TIP SHEARS 8MM DA VINCI (MISCELLANEOUS) ×1
DERMABOND ADVANCED (GAUZE/BANDAGES/DRESSINGS) ×1
DERMABOND ADVANCED .7 DNX12 (GAUZE/BANDAGES/DRESSINGS) ×1 IMPLANT
DRAPE ARM DVNC X/XI (DISPOSABLE) ×4 IMPLANT
DRAPE COLUMN DVNC XI (DISPOSABLE) ×1 IMPLANT
DRAPE DA VINCI XI ARM (DISPOSABLE) ×4
DRAPE DA VINCI XI COLUMN (DISPOSABLE) ×1
DRAPE SHEET LG 3/4 BI-LAMINATE (DRAPES) ×4 IMPLANT
DRAPE SURG IRRIG POUCH 19X23 (DRAPES) ×2 IMPLANT
ELECT REM PT RETURN 15FT ADLT (MISCELLANEOUS) ×2 IMPLANT
GLOVE BIO SURGEON STRL SZ 6 (GLOVE) ×8 IMPLANT
GLOVE BIO SURGEON STRL SZ 6.5 (GLOVE) ×4 IMPLANT
GOWN STRL REUS W/ TWL LRG LVL3 (GOWN DISPOSABLE) ×3 IMPLANT
GOWN STRL REUS W/TWL LRG LVL3 (GOWN DISPOSABLE) ×6
HOLDER FOLEY CATH W/STRAP (MISCELLANEOUS) ×2 IMPLANT
IRRIG SUCT STRYKERFLOW 2 WTIP (MISCELLANEOUS) ×2
IRRIGATION SUCT STRKRFLW 2 WTP (MISCELLANEOUS) ×1 IMPLANT
KIT BASIN OR (CUSTOM PROCEDURE TRAY) ×2 IMPLANT
KIT PROCEDURE DA VINCI SI (MISCELLANEOUS)
KIT PROCEDURE DVNC SI (MISCELLANEOUS) IMPLANT
MANIPULATOR UTERINE 4.5 ZUMI (MISCELLANEOUS) ×2 IMPLANT
MARKER SKIN DUAL TIP RULER LAB (MISCELLANEOUS) ×1 IMPLANT
NDL SAFETY ECLIPSE 18X1.5 (NEEDLE) IMPLANT
NDL SPNL 18GX3.5 QUINCKE PK (NEEDLE) IMPLANT
NEEDLE HYPO 18GX1.5 SHARP (NEEDLE)
NEEDLE SPNL 18GX3.5 QUINCKE PK (NEEDLE) IMPLANT
OBTURATOR OPTICAL STANDARD 8MM (TROCAR) ×1
OBTURATOR OPTICAL STND 8 DVNC (TROCAR) ×1
OBTURATOR OPTICALSTD 8 DVNC (TROCAR) ×1 IMPLANT
OCCLUDER COLPOPNEUMO (BALLOONS) ×2 IMPLANT
PAD POSITIONING PINK XL (MISCELLANEOUS) ×2 IMPLANT
PORT ACCESS TROCAR AIRSEAL 12 (TROCAR) ×1 IMPLANT
PORT ACCESS TROCAR AIRSEAL 5M (TROCAR) ×1
POUCH SPECIMEN RETRIEVAL 10MM (ENDOMECHANICALS) IMPLANT
SEAL CANN UNIV 5-8 DVNC XI (MISCELLANEOUS) ×4 IMPLANT
SEAL XI 5MM-8MM UNIVERSAL (MISCELLANEOUS) ×4
SET TRI-LUMEN FLTR TB AIRSEAL (TUBING) ×2 IMPLANT
SHEET LAVH (DRAPES) ×2 IMPLANT
SOLUTION ELECTROLUBE (MISCELLANEOUS) ×2 IMPLANT
SUT MNCRL AB 4-0 PS2 18 (SUTURE) ×2 IMPLANT
SUT VIC AB 0 CT1 27 (SUTURE) ×2
SUT VIC AB 0 CT1 27XBRD ANTBC (SUTURE) ×1 IMPLANT
SYR 10ML LL (SYRINGE) IMPLANT
SYR 50ML LL SCALE MARK (SYRINGE) ×2 IMPLANT
TOWEL OR 17X26 10 PK STRL BLUE (TOWEL DISPOSABLE) ×2 IMPLANT
TOWEL OR NON WOVEN STRL DISP B (DISPOSABLE) ×2 IMPLANT
TRAP SPECIMEN MUCOUS 40CC (MISCELLANEOUS) IMPLANT
TRAY FOLEY CATH 16FR SILVER (SET/KITS/TRAYS/PACK) ×1 IMPLANT
TRAY FOLEY MTR SLVR 16FR STAT (SET/KITS/TRAYS/PACK) ×1 IMPLANT
TRAY LAPAROSCOPIC (CUSTOM PROCEDURE TRAY) ×2 IMPLANT
TROCAR BLADELESS OPT 5 100 (ENDOMECHANICALS) ×1 IMPLANT
UNDERPAD 30X30 (UNDERPADS AND DIAPERS) ×2 IMPLANT
WATER STERILE IRR 1000ML POUR (IV SOLUTION) ×2 IMPLANT

## 2018-01-19 NOTE — H&P (Signed)
H&P Note: Gyn-Onc   [name] [name] 47 y.o. female  CC:  Abnormal uterine bleeding, adenomyosis of the uterus.   Assessment/Plan:  [name]  is a 47 y.o.  year old with symptoms of abnormal uterine bleeding in the setting of unopposed estrogen. Her uterus has the appearance of adenomyosis on ultrasound.  Given her intolerance/allergy to progesterone, she is not a good candidate for medical therapy. Adenomyosis tends to poorly respond to therapies such as ablation.  Therefore, I am recommending robotic total hysterectomy and bilateral salpingectomy.  I discussed operative risks including  bleeding, infection, damage to internal organs (such as bladder,ureters, bowels), blood clot, reoperation and rehospitalization. I discussed that MS can flare with perioperative trauma (though can also flare from symptoms of uncontrolled bleeding).  I discussed restrictions postop.   HPI: [name] [name] is a 47 year old P2 who is seen for abnormal uterine bleeding and thickened endometrium on [place].  The patient has a history of irregular menses since June, 2018. It spaced for several weeks and then became extremely heavy vaginal bleeding in the first week of April, 2019 passing clots.  TVUS was performed on 12/11/17 which showed a uterus measuring 7.8x5x5.7cm with retroversion and no fibroids. Mildly heterogeneous myometrial echotexture consistent with adenomyosis. 2+cm anechoic cyst on left ovary.   She was started on estrogen/progesterone HRT for symptoms of menopause at approximately age 10. She developed rash to the progesterone. She tried introducing it back, however always developed the rash. Instead she continued with single agent estrogen which did control her symptoms of menopause.  She has a history of MS since age 35. 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 Stress, decreased sleep and febrile morbidity triggers flares.  She  is otherwise very health.  Her surgical history is significant for an uncomplicated laparoscopic cholecystectomy in 2008. She has a history of a cesarean section and a bunionectomy.   Her family history is significant for a mother who developed uterine cancer in 1992 and was treated surgically without needing adjuvant therapy. She has no family history of colon, breast or ovarian cancers.  She has had one vaginal delivery and one cesarean section.    Endometrial biopsy preoperatively on 12/14/17 showed benign pathology.   Current Meds:  Outpatient Encounter Medications as of 12/14/2017  Medication Sig  . amantadine (SYMMETREL) 100 MG capsule Take 1 capsule (100 mg total) by mouth 2 (two) times daily.  . cetirizine (ZYRTEC) 10 MG tablet Take 10 mg by mouth at bedtime. As needed at night  . cyclobenzaprine (FLEXERIL) 10 MG tablet Take 10 mg by mouth at bedtime.  . hydrOXYzine (ATARAX/VISTARIL) 25 MG tablet Take 25 mg by mouth at bedtime.  . methylphenidate (RITALIN) 10 MG tablet Take 10 mg by mouth 2 (two) times daily.  Marland Kitchen MINIVELLE 0.1 MG/24HR patch Every 4th day.   No facility-administered encounter medications on file as of 12/14/2017.     Allergy:  Allergies  Allergen Reactions  . Venlafaxine Hives    blisters  . Augmentin [Amoxicillin-Pot Clavulanate] Rash    tongue swelling Has patient had a PCN reaction causing immediate rash, facial/tongue/throat swelling, SOB or lightheadedness with hypotension: No Has patient had a PCN reaction causing severe rash involving mucus membranes or skin necrosis: No Has patient had a PCN reaction that required hospitalization: No Has patient had a PCN reaction occurring within the last 10 years: No If all of the above  answers are "NO", then may proceed with Cephalosporin use.   . Demerol [Meperidine] Swelling and Rash    Tongue swelling  . Estrogens Rash    The patient can use patch,No oral  . Gabapentin Rash  . Stadol [Butorphanol] Swelling and  Rash    Tongue swelling    Social Hx:   Social History   Socioeconomic History  . Marital status: Divorced    Spouse name: Not on file  . Number of children: 2  . Years of education: Not on file  . Highest education level: Not on file  Occupational History  . Not on file  Social Needs  . Financial resource strain: Not on file  . Food insecurity:    Worry: Not on file    Inability: Not on file  . Transportation needs:    Medical: Not on file    Non-medical: Not on file  Tobacco Use  . Smoking status: Never Smoker  . Smokeless tobacco: Never Used  Substance and Sexual Activity  . Alcohol use: Yes    Alcohol/week: 0.0 oz    Comment: rarely  . Drug use: No  . Sexual activity: Yes  Lifestyle  . Physical activity:    Days per week: Not on file    Minutes per session: Not on file  . Stress: Not on file  Relationships  . Social connections:    Talks on phone: Not on file    Gets together: Not on file    Attends religious service: Not on file    Active member of club or organization: Not on file    Attends meetings of clubs or organizations: Not on file    Relationship status: Not on file  . Intimate partner violence:    Fear of current or ex partner: Not on file    Emotionally abused: Not on file    Physically abused: Not on file    Forced sexual activity: Not on file  Other Topics Concern  . Not on file  Social History Narrative  . Not on file    Past Surgical Hx:  Past Surgical History:  Procedure Laterality Date  . adnoidectomy    . BUNIONECTOMY Left    foot  . CESAREAN SECTION    . CHOLECYSTECTOMY    . wisdom teeth exraction      Past Medical Hx:  Past Medical History:  Diagnosis Date  . Allergy   . Anxiety   . Cancer ([place])    basal cell skin ca  . Depression   . Family history of adverse reaction to anesthesia    sister woke up during surgery age 17's for tonsillectomy  . Multiple sclerosis ([place])   . Neuromuscular disorder ([place])    multiple  sclerosis  . Vision abnormalities     Past Gynecological History:  C/s x 1 and vaginal delivery x 1 Patient's last menstrual period was 07/13/2015 (exact date).  Family Hx:  Family History  Problem Relation Age of Onset  . Cervical cancer Mother   . Dementia Mother   . Melanoma Father   . Ulcerative colitis Sister   . Colon cancer Neg Hx   . Esophageal cancer Neg Hx   . Rectal cancer Neg Hx   . Stomach cancer Neg Hx     Review of Systems:  Constitutional  Feels well,    ENT Normal appearing ears and nares bilaterally Skin/Breast  No rash, sores, jaundice, itching, dryness Cardiovascular  No chest pain, shortness of  breath, or edema  Pulmonary  No cough or wheeze.  Gastro Intestinal  No nausea, vomitting, or diarrhoea. No bright red blood per rectum, no abdominal pain, change in bowel movement, or constipation.  Genito Urinary  No frequency, urgency, dysuria,       Musculo Skeletal  No myalgia, arthralgia, joint swelling or pain  Neurologic  No weakness, numbness, change in gait,  Psychology  No depression, anxiety, insomnia.   Vitals:  Blood pressure 105/73, pulse 69, temperature 98.2 F (36.8 C), temperature source Oral, resp. rate 16, height 5\' 4"  (1.626 m), weight 144 lb 9.6 oz (65.6 kg), last menstrual period 07/13/2015, SpO2 98 %.  Physical Exam: WD in NAD Neck  Supple NROM, without any enlargements.  Lymph Node Survey No cervical supraclavicular or inguinal adenopathy Cardiovascular  Pulse normal rate, regularity and rhythm. S1 and S2 normal.  Lungs  Clear to auscultation bilateraly, without wheezes/crackles/rhonchi. Good air movement.  Skin  No rash/lesions/breakdown  Psychiatry  Alert and oriented to person, place, and time  Abdomen  Normoactive bowel sounds, abdomen soft, non-tender and thin without evidence of hernia.  Back No CVA tenderness Genito Urinary  Vulva/vagina: Normal external female genitalia.   No lesions. No discharge or  bleeding.  Bladder/urethra:  No lesions or masses, well supported bladder  Vagina: normal  Cervix: Normal appearing, no lesions.  Uterus:  Small, mobile, no parametrial involvement or nodularity.  Adnexa: no palpable masses. Rectal  deferred Extremities  No bilateral cyanosis, clubbing or edema.   [name] Solo, MD  01/19/2018, 6:59 AM

## 2018-01-19 NOTE — Discharge Instructions (Signed)
01/19/2018  Return to work: 4 weeks  Activity: 1. Be up and out of the bed during the day.  Take a nap if needed.  You may walk up steps but be careful and use the hand rail.  Stair climbing will tire you more than you think, you may need to stop part way and rest.   2. No lifting or straining for 6 weeks.  3. No driving for 1 weeks.  Do Not drive if you are taking narcotic pain medicine.  4. Shower daily.  Use soap and water on your incision and pat dry; don't rub.   5. No sexual activity and nothing in the vagina for 8 weeks.  Medications:  - Take ibuprofen and tylenol first line for pain control. Take these regularly (every 6 hours) to decrease the build up of pain.  - If necessary, for severe pain not relieved by ibuprofen, take percocet.  - While taking percocet you should take sennakot every night to reduce the likelihood of constipation. If this causes diarrhea, stop its use.  Diet: 1. Low sodium Heart Healthy Diet is recommended.  2. It is safe to use a laxative if you have difficulty moving your bowels.   Wound Care: 1. Keep clean and dry.  Shower daily.  Reasons to call the Doctor:   Fever - Oral temperature greater than 100.4 degrees Fahrenheit  Foul-smelling vaginal discharge  Difficulty urinating  Nausea and vomiting  Increased pain at the site of the incision that is unrelieved with pain medicine.  Difficulty breathing with or without chest pain  New calf pain especially if only on one side  Sudden, continuing increased vaginal bleeding with or without clots.   Follow-up: 1. See Everitt Amber in 3-4 weeks.  Contacts: For questions or concerns you should contact:  Dr. Everitt Amber at [phone] After hours and on week-ends call [phone] and ask to speak to the physician on call for Gynecologic Oncology

## 2018-01-19 NOTE — Anesthesia Postprocedure Evaluation (Signed)
Anesthesia Post Note  Patient: [name] [name]  Procedure(s) Performed: XI ROBOTIC ASSISTED LAPAROSCOPIC HYSTERECTOMY AND BILATERAL SALPINGECTOMY, LEFT OVARIAN CYSTECTOMY (N/A )     Patient location during evaluation: PACU Anesthesia Type: General Level of consciousness: sedated Pain management: pain level controlled Vital Signs Assessment: post-procedure vital signs reviewed and stable Respiratory status: spontaneous breathing and respiratory function stable Cardiovascular status: stable Postop Assessment: no apparent nausea or vomiting Anesthetic complications: no    Last Vitals:  Vitals:   01/19/18 1015 01/19/18 1030  BP: 110/71 103/73  Pulse: (!) 56 63  Resp: 15 13  Temp:    SpO2: 100% 100%    Last Pain:  Vitals:   01/19/18 1045  TempSrc:   PainSc: [place]

## 2018-01-19 NOTE — Op Note (Signed)
OPERATIVE NOTE 01/19/18  Surgeon: Donaciano Eva   Assistants: Dr Lahoma Crocker (an MD assistant was necessary for tissue manipulation, management of robotic instrumentation, retraction and positioning due to the complexity of the case and hospital policies).   Anesthesia: General endotracheal anesthesia  ASA Class: 2   Pre-operative Diagnosis: abnormal uterine bleeding and adenomyosis  Post-operative Diagnosis: same and left ovarian cyst  Operation: Robotic-assisted laparoscopic total hysterectomy with bilateral salpingectomy, left ovarian cystectomy  Surgeon: Donaciano Eva  Assistant Surgeon: Lahoma Crocker MD  Anesthesia: GET  Urine Output: 200cc  Operative Findings:  : 7cm grossly normal appearing uterus. Surgically interrupted fallopian tubes (with filshie clips). Left ovarian unilocular simple cyst (approx 3cm). Normal appendix. Normal small intestine, omentum and diaphragms.   Estimated Blood Loss:  <20cc      Total IV Fluids: 600 ml         Specimens: left ovarian cyst. Uterus, cervix, bilateral fallopian tubes.         Complications:  None; patient tolerated the procedure well.         Disposition: PACU - hemodynamically stable.  Procedure Details  The patient was seen in the Holding Room. The risks, benefits, complications, treatment options, and expected outcomes were discussed with the patient.  The patient concurred with the proposed plan, giving informed consent.  The site of surgery properly noted/marked. The patient was identified as [name] [name] and the procedure verified as a Robotic-assisted hysterectomy with bilateral salpingectomy. A Time Out was held and the above information confirmed.  After induction of anesthesia, the patient was draped and prepped in the usual sterile manner. Pt was placed in supine position after anesthesia and draped and prepped in the usual sterile manner. The abdominal drape was placed after the CholoraPrep  had been allowed to dry for 3 minutes.  Her arms were tucked to her side with all appropriate precautions.  The shoulders were stabilized with padded shoulder blocks applied to the acromium processes.  The patient was placed in the semi-lithotomy position in [place].  The perineum was prepped with Betadine. The patient was then prepped. Foley catheter was placed.  A sterile speculum was placed in the vagina.  The cervix was grasped with a single-tooth tenaculum and dilated with Kennon Rounds dilators.  The ZUMI uterine manipulator with a medium colpotomizer ring was placed without difficulty.  A pneum occluder balloon was placed over the manipulator.  OG tube placement was confirmed and to suction.   Next, a 5 mm skin incision was made 1 cm below the subcostal margin in the midclavicular line.  The 5 mm Optiview port and scope was used for direct entry.  Opening pressure was under 10 mm CO2.  The abdomen was insufflated and the findings were noted as above.   At this point and all points during the procedure, the patient's intra-abdominal pressure did not exceed 15 mmHg. Next, a 10 mm skin incision was made in the umbilicus and a right and left port was placed about 10 cm lateral to the robot port on the right and left side.   All ports were placed under direct visualization.  The patient was placed in steep Trendelenburg.  Bowel was folded away into the upper abdomen.  The robot was docked in the normal manner.  The hysterectomy was started after the round ligament on the right side was incised and the retroperitoneum was entered and the pararectal space was developed.  The ureter was noted to be on the  medial leaf of the broad ligament.  The peritoneum above the ureter was incised and stretched and the fallopian tube was skeletonized off of the ovary and kept attached to the uterus. The utero-ovarian ligament was skeletonized, cauterized and cut.  The posterior peritoneum was taken down to the level of the KOH  ring.  The anterior peritoneum was also taken down.  The bladder flap was created to the level of the KOH ring.  The uterine artery on the right side was skeletonized, cauterized and cut in the normal manner.  A similar procedure was performed on the left, however, because a persistent left ovarian cyst was present, this was removed prior to performing the salpingectomy off of the ovary. To do this the ovarian cortex was incised with scissors overlying the cyst. THe cyst was dissected from the overlying ovarian cortex. Cyst rupture was unavoidable (simple cyst, benign appearing). The ovarian cyst tissue was removed with a grasper and sent for pathology.  The colpotomy was made and the uterus, cervix, bilateral tubes were amputated and delivered through the vagina.  Pedicles were inspected and excellent hemostasis was achieved.    The colpotomy at the vaginal cuff was closed with Vicryl on a CT1 needle in a running manner.  Irrigation was used and excellent hemostasis was achieved.  At this point in the procedure was completed.  Robotic instruments were removed under direct visulaization.  The robot was undocked. The 10 mm ports were closed with Vicryl on a UR-5 needle and the fascia was closed with 0 Vicryl on a UR-5 needle.  The skin was closed with 4-0 Vicryl in a subcuticular manner.  Dermabond was applied.  Sponge, lap and needle counts correct x 2.    The vagina was swabbed with  minimal bleeding noted.   All instrument and needle counts were correct x  3.   The patient was transferred to the recovery room in a stable condition.  Donaciano Eva, MD

## 2018-01-19 NOTE — Transfer of Care (Signed)
Immediate Anesthesia Transfer of Care Note  Patient: [name] [name]  Procedure(s) Performed: XI ROBOTIC ASSISTED LAPAROSCOPIC HYSTERECTOMY AND BILATERAL SALPINGECTOMY, LEFT OVARIAN CYSTECTOMY (N/A )  Patient Location: PACU  Anesthesia Type:General  Level of Consciousness: awake, alert  and oriented  Airway & Oxygen Therapy: Patient Spontanous Breathing and Patient connected to face mask oxygen  Post-op Assessment: Report given to RN and Post -op Vital signs reviewed and stable  Post vital signs: Reviewed and stable  Last Vitals:  Vitals Value Taken Time  BP    Temp    Pulse 75 01/19/2018  9:22 AM  Resp 18 01/19/2018  9:22 AM  SpO2 84 % 01/19/2018  9:22 AM  Vitals shown include unvalidated device data.  Last Pain:  Vitals:   01/19/18 0634  TempSrc:   PainSc: 0-No pain         Complications: No apparent anesthesia complications

## 2018-01-19 NOTE — Anesthesia Procedure Notes (Addendum)
Procedure Name: Intubation Date/Time: 01/19/2018 7:39 AM Performed by: Glory Buff, CRNA Pre-anesthesia Checklist: Patient identified, Emergency Drugs available, Suction available and Patient being monitored Patient Re-evaluated:Patient Re-evaluated prior to induction Oxygen Delivery Method: Circle system utilized Preoxygenation: Pre-oxygenation with 100% oxygen Induction Type: IV induction Ventilation: Mask ventilation without difficulty Laryngoscope Size: Mac and 4 Grade View: Grade I Tube type: Oral Tube size: 7.0 mm Number of attempts: 1 Airway Equipment and Method: Stylet Placement Confirmation: ETT inserted through vocal cords under direct vision Secured at: 22 cm Tube secured with: Tape

## 2018-01-20 DIAGNOSIS — N939 Abnormal uterine and vaginal bleeding, unspecified: Secondary | ICD-10-CM | POA: Diagnosis not present

## 2018-01-20 DIAGNOSIS — G35 Multiple sclerosis: Secondary | ICD-10-CM | POA: Diagnosis not present

## 2018-01-20 DIAGNOSIS — D271 Benign neoplasm of left ovary: Secondary | ICD-10-CM | POA: Diagnosis not present

## 2018-01-20 DIAGNOSIS — Z79899 Other long term (current) drug therapy: Secondary | ICD-10-CM | POA: Diagnosis not present

## 2018-01-20 DIAGNOSIS — N800 Endometriosis of uterus: Secondary | ICD-10-CM | POA: Diagnosis not present

## 2018-01-20 DIAGNOSIS — Z888 Allergy status to other drugs, medicaments and biological substances status: Secondary | ICD-10-CM | POA: Diagnosis not present

## 2018-01-20 DIAGNOSIS — Z85828 Personal history of other malignant neoplasm of skin: Secondary | ICD-10-CM | POA: Diagnosis not present

## 2018-01-20 DIAGNOSIS — Z8049 Family history of malignant neoplasm of other genital organs: Secondary | ICD-10-CM | POA: Diagnosis not present

## 2018-01-20 LAB — BASIC METABOLIC PANEL
ANION GAP: 9 (ref 5–15)
BUN: 12 mg/dL (ref 6–20)
CHLORIDE: 109 mmol/L (ref 101–111)
CO2: 22 mmol/L (ref 22–32)
CREATININE: 0.78 mg/dL (ref 0.44–1.00)
Calcium: 9 mg/dL (ref 8.9–10.3)
GFR calc non Af Amer: >60 mL/min (ref >60)
Glucose, Bld: 118 mg/dL — ABNORMAL HIGH (ref 65–99)
Potassium: 4.2 mmol/L (ref 3.5–5.1)
Sodium: 140 mmol/L (ref 135–145)

## 2018-01-20 LAB — CBC
HEMATOCRIT: 41.6 % (ref 36.0–46.0)
HEMOGLOBIN: 14.1 g/dL (ref 12.0–15.0)
MCH: 30.1 pg (ref 26.0–34.0)
MCHC: 33.9 g/dL (ref 30.0–36.0)
MCV: 88.9 fL (ref 78.0–100.0)
Platelets: 305 10*3/uL (ref 150–400)
RBC: 4.68 MIL/uL (ref 3.87–5.11)
RDW: 12.6 % (ref 11.5–15.5)
WBC: 18.2 10*3/uL — AB (ref 4.0–10.5)

## 2018-01-20 MED ORDER — ACETAMINOPHEN 500 MG PO TABS: 1000.0000 mg | ORAL_TABLET | Freq: Four times a day (QID) | ORAL | 0 refills | Status: DC

## 2018-01-20 MED ORDER — OXYCODONE HCL 5 MG PO TABS: 5.0000 mg | ORAL_TABLET | ORAL | 0 refills | Status: DC | PRN

## 2018-01-20 MED ORDER — SENNOSIDES-DOCUSATE SODIUM 8.6-50 MG PO TABS: 2.0000 | ORAL_TABLET | Freq: Every day | ORAL | 1 refills | Status: DC

## 2018-01-20 MED FILL — oxyCODONE HCL 5 MG TABS: 5 | 5 days supply | Qty: 30 | Fill #0

## 2018-01-20 NOTE — Discharge Summary (Signed)
Physician Discharge Summary  Patient ID: [name] [name] MRN: [id number] DOB/AGE: 49/10/1970 49 y.o.  Admit date: 01/19/2018 Discharge date: 01/20/2018  Admission Diagnoses: Adenomyosis  Discharge Diagnoses:  Principal Problem:   Adenomyosis Active Problems:   Abnormal uterine bleeding (AUB)   Abnormal uterine bleeding   Discharged Condition: good  Hospital Course:  1/ patient was admitted on 01/19/18 for robotic hysterectomy, bilateral salpingectomy and left ovarian cystectomy 2/ surgery was uncomplicated  3/ on postoperative day 1 the patient was meeting discharge criteria: tolerating PO, voiding urine, ambulating, pain well controlled on oral medications.  4/ new medications on discharge include percoct, senakot and ibuprofen/tylenol.  Consults: None  Significant Diagnostic Studies: labs:  CBC    Component Value Date/Time   WBC 18.2 (H) 01/20/2018 0450   RBC 4.68 01/20/2018 0450   HGB 14.1 01/20/2018 0450   HGB 15.6 10/12/2017 0947   HCT 41.6 01/20/2018 0450   HCT 46.9 (H) 10/12/2017 0947   PLT 305 01/20/2018 0450   PLT 368 10/12/2017 0947   MCV 88.9 01/20/2018 0450   MCV 91 10/12/2017 0947   MCH 30.1 01/20/2018 0450   MCHC 33.9 01/20/2018 0450   RDW 12.6 01/20/2018 0450   RDW 14.5 10/12/2017 0947   LYMPHSABS 1.8 10/12/2017 0947   EOSABS 0.1 10/12/2017 0947   BASOSABS 0.0 10/12/2017 0947     Treatments: surgery: see above  Discharge Exam: Blood pressure 90/60, pulse 65, temperature 98.4 F (36.9 C), temperature source Oral, resp. rate 16, height 5\' 4"  (1.626 m), weight 144 lb 9.6 oz (65.6 kg), last menstrual period 07/13/2015, SpO2 99 %. General appearance: alert and cooperative GI: soft, non-tender; bowel sounds normal; no masses,  no organomegaly Incision/Wound: all clean and dry x 4  Disposition: Discharge disposition: 01-Home or Self Care       Discharge Instructions    (HEART FAILURE PATIENTS) Call MD:  Anytime you have any of the following  symptoms: 1) 3 pound weight gain in 24 hours or 5 pounds in 1 week 2) shortness of breath, with or without a dry hacking cough 3) swelling in the hands, feet or stomach 4) if you have to sleep on extra pillows at night in order to breathe.   Complete by:  As directed    Call MD for:  difficulty breathing, headache or visual disturbances   Complete by:  As directed    Call MD for:  extreme fatigue   Complete by:  As directed    Call MD for:  hives   Complete by:  As directed    Call MD for:  persistant dizziness or light-headedness   Complete by:  As directed    Call MD for:  persistant nausea and vomiting   Complete by:  As directed    Call MD for:  redness, tenderness, or signs of infection (pain, swelling, redness, odor or green/yellow discharge around incision site)   Complete by:  As directed    Call MD for:  severe uncontrolled pain   Complete by:  As directed    Call MD for:  temperature >100.4   Complete by:  As directed    Diet - low sodium heart healthy   Complete by:  As directed    Diet general   Complete by:  As directed    Driving Restrictions   Complete by:  As directed    No driving for 7 days or until off narcotic pain medication   Increase activity slowly   Complete  by:  As directed    Remove dressing in 24 hours   Complete by:  As directed    Sexual Activity Restrictions   Complete by:  As directed    No intercourse for 6 weeks     Allergies as of 01/20/2018      Reactions   Venlafaxine Hives   blisters   Augmentin [amoxicillin-pot Clavulanate] Rash   tongue swelling Has patient had a PCN reaction causing immediate rash, facial/tongue/throat swelling, SOB or lightheadedness with hypotension: No Has patient had a PCN reaction causing severe rash involving mucus membranes or skin necrosis: No Has patient had a PCN reaction that required hospitalization: No Has patient had a PCN reaction occurring within the last 10 years: No If all of the above answers are  "NO", then may proceed with Cephalosporin use.   Demerol [meperidine] Swelling, Rash   Tongue swelling   Estrogens Rash   The patient can use patch,No oral   Gabapentin Rash   Stadol [butorphanol] Swelling, Rash   Tongue swelling      Medication List    STOP taking these medications   MINIVELLE 0.1 MG/24HR patch Generic drug:  estradiol     TAKE these medications   acetaminophen 500 MG tablet Commonly known as:  TYLENOL Take 2 tablets (1,000 mg total) by mouth every 6 (six) hours.   Amantadine HCl 100 MG tablet Take 100 mg by mouth 2 (two) times daily as needed (for multiple sclerosis flares).   cetirizine 10 MG tablet Commonly known as:  ZYRTEC Take 10 mg by mouth at bedtime.   cyclobenzaprine 10 MG tablet Commonly known as:  FLEXERIL Take 10 mg by mouth 2 (two) times daily as needed (for multiple sclerosis flares).   hydrOXYzine 25 MG tablet Commonly known as:  ATARAX/VISTARIL Take 25 mg by mouth daily as needed (for itching).   ibuprofen 600 MG tablet Commonly known as:  ADVIL,MOTRIN Take 600 mg by mouth daily as needed (for pain.).   methylphenidate 10 MG tablet Commonly known as:  RITALIN Take 10 mg by mouth 2 (two) times daily as needed (for multiple sclerosis fatigue).   oxyCODONE 5 MG immediate release tablet Commonly known as:  Oxy IR/ROXICODONE Take 1 tablet (5 mg total) by mouth every 4 (four) hours as needed for severe pain or breakthrough pain.   senna-docusate 8.6-50 MG tablet Commonly known as:  Senokot-S Take 2 tablets by mouth at bedtime.      Follow-up Information    Everitt Amber, MD In 3 weeks.   Specialty:  Obstetrics and Gynecology Contact information: [place] [address] [phone]           Signed: Thereasa Solo 01/20/2018, 8:12 AM

## 2018-01-20 NOTE — Progress Notes (Signed)
Discharge instructions discussed with patient, verbalized agreement and undertstanding.

## 2018-01-27 ENCOUNTER — Other Ambulatory Visit: Payer: Self-pay | Admitting: Gynecologic Oncology

## 2018-01-27 DIAGNOSIS — L700 Acne vulgaris: Secondary | ICD-10-CM

## 2018-01-27 MED ORDER — ESTRADIOL 0.1 MG/24HR TD PTTW: 1.0000 | MEDICATED_PATCH | TRANSDERMAL | 12 refills | Status: DC

## 2018-01-27 MED FILL — ESTRADIOL 0.1 MG/24HR PTTW: 0.1 | 28 days supply | Qty: 8 | Fill #0

## 2018-02-09 DIAGNOSIS — Z658 Other specified problems related to psychosocial circumstances: Secondary | ICD-10-CM | POA: Diagnosis not present

## 2018-02-09 DIAGNOSIS — G379 Demyelinating disease of central nervous system, unspecified: Secondary | ICD-10-CM | POA: Diagnosis not present

## 2018-02-09 DIAGNOSIS — Z0000 Encounter for general adult medical examination without abnormal findings: Secondary | ICD-10-CM | POA: Diagnosis not present

## 2018-02-09 DIAGNOSIS — E559 Vitamin D deficiency, unspecified: Secondary | ICD-10-CM | POA: Diagnosis not present

## 2018-02-09 DIAGNOSIS — G43009 Migraine without aura, not intractable, without status migrainosus: Secondary | ICD-10-CM | POA: Diagnosis not present

## 2018-02-11 ENCOUNTER — Inpatient Hospital Stay: Payer: 59 | Attending: Gynecologic Oncology | Admitting: Gynecologic Oncology

## 2018-02-11 ENCOUNTER — Encounter: Payer: Self-pay | Admitting: Gynecologic Oncology

## 2018-02-11 VITALS — BP 116/80 | HR 74 | Temp 98.1°F | Resp 20 | Ht 64.0 in | Wt 139.0 lb

## 2018-02-11 DIAGNOSIS — N939 Abnormal uterine and vaginal bleeding, unspecified: Secondary | ICD-10-CM | POA: Diagnosis not present

## 2018-02-11 DIAGNOSIS — Z90710 Acquired absence of both cervix and uterus: Secondary | ICD-10-CM | POA: Insufficient documentation

## 2018-02-11 DIAGNOSIS — Z90722 Acquired absence of ovaries, bilateral: Secondary | ICD-10-CM | POA: Insufficient documentation

## 2018-02-11 NOTE — Progress Notes (Signed)
Follow-up Note: Gyn-Onc   [name] [name] 47 y.o. female  CC:  Chief Complaint  Patient presents with  . Abnormal uterine bleeding (AUB)    Assessment/Plan:  [name] [name]  is a 47 y.o.  year old with history of abnormal uterine bleeding in the setting of unopposed estrogen. S/p robotic hysterectomy, bilateral salpingectomy with left ovarian cystectomy on 01/19/18. Benign pathology.  Doing well postop.  Recommend follow-up annually with her OBGYN, Dr Matthew Saras.   HPI: [name] [name] is a 47 year old P2 who is seen for abnormal uterine bleeding and thickened endometrium on [place].  The patient has a history of irregular menses since June, 2018. It spaced for several weeks and then became extremely heavy vaginal bleeding in the first week of April, 2019 passing clots.  TVUS was performed on 12/11/17 which showed a uterus measuring 7.8x5x5.7cm with retroversion and no fibroids. Mildly heterogeneous myometrial echotexture consistent with adenomyosis. 2+cm anechoic cyst on left ovary.   She was started on estrogen/progesterone HRT for symptoms of menopause at approximately age 31. She developed rash to the progesterone. She tried introducing it back, however always developed the rash. Instead she continued with single agent estrogen which did control her symptoms of menopause.  She has a history of MS since age 11. 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 Stress, decreased sleep and febrile morbidity triggers flares.  She is otherwise very health.  Her surgical history is significant for an uncomplicated laparoscopic cholecystectomy in 2008. She has a history of a cesarean section and a bunionectomy.   Her family history is significant for a mother who developed uterine cancer in 1992 and was treated surgically without needing adjuvant therapy. She has no family history of colon, breast or ovarian cancers.  She has had one  vaginal delivery and one cesarean section.    Interval Hx:  On 01/19/18 she underwent robotic assisted total hysterectomy, bilateral salpingectomy. Intraoperative findings were significant for a 7cm grossly normal appearing uterus, surgically interrupted fallopian tubes, left ovarian unilocular simple cyst (approximately 3cm), normal appendix. Surgery was uncomplicated. Postoperatively she had significant pain (likely from foley) and nausea. This improved overnight. Her labs remained stable.  Pathology revealed a benign left ovarian simple cyst and benign uterus (endometrium, myometrium and tubes).  Since surgery she has been doing well. She has some pelvic heaviness and some fatigue. She has had no flares of MS. She has no vaginal bleeding.  Current Meds:  Outpatient Encounter Medications as of 02/11/2018  Medication Sig  . acetaminophen (TYLENOL) 500 MG tablet Take 2 tablets (1,000 mg total) by mouth every 6 (six) hours.  . Amantadine HCl 100 MG tablet Take 100 mg by mouth 2 (two) times daily as needed (for multiple sclerosis flares).   . cetirizine (ZYRTEC) 10 MG tablet Take 10 mg by mouth at bedtime.   . cyclobenzaprine (FLEXERIL) 10 MG tablet Take 10 mg by mouth 2 (two) times daily as needed (for multiple sclerosis flares).   Marland Kitchen estradiol (MINIVELLE) 0.1 MG/24HR patch Place 1 patch (0.1 mg total) onto the skin 2 (two) times a week.  . hydrOXYzine (ATARAX/VISTARIL) 25 MG tablet Take 25 mg by mouth daily as needed (for itching).   Marland Kitchen ibuprofen (ADVIL,MOTRIN) 600 MG tablet Take 600 mg by mouth daily as needed (for pain.).  Marland Kitchen methylphenidate (RITALIN) 10 MG tablet Take 10 mg by mouth 2 (two) times daily as needed (for multiple sclerosis  fatigue).   Marland Kitchen oxyCODONE (OXY IR/ROXICODONE) 5 MG immediate release tablet Take 1 tablet (5 mg total) by mouth every 4 (four) hours as needed for severe pain or breakthrough pain.  Marland Kitchen senna-docusate (SENOKOT-S) 8.6-50 MG tablet Take 2 tablets by mouth at bedtime.    No facility-administered encounter medications on file as of 02/11/2018.     Allergy:  Allergies  Allergen Reactions  . Venlafaxine Hives    blisters  . Augmentin [Amoxicillin-Pot Clavulanate] Rash    tongue swelling Has patient had a PCN reaction causing immediate rash, facial/tongue/throat swelling, SOB or lightheadedness with hypotension: No Has patient had a PCN reaction causing severe rash involving mucus membranes or skin necrosis: No Has patient had a PCN reaction that required hospitalization: No Has patient had a PCN reaction occurring within the last 10 years: No If all of the above answers are "NO", then may proceed with Cephalosporin use.   . Demerol [Meperidine] Swelling and Rash    Tongue swelling  . Estrogens Rash    The patient can use patch,No oral  . Gabapentin Rash  . Stadol [Butorphanol] Swelling and Rash    Tongue swelling    Social Hx:   Social History   Socioeconomic History  . Marital status: Divorced    Spouse name: Not on file  . Number of children: 2  . Years of education: Not on file  . Highest education level: Not on file  Occupational History  . Not on file  Social Needs  . Financial resource strain: Not on file  . Food insecurity:    Worry: Not on file    Inability: Not on file  . Transportation needs:    Medical: Not on file    Non-medical: Not on file  Tobacco Use  . Smoking status: Never Smoker  . Smokeless tobacco: Never Used  Substance and Sexual Activity  . Alcohol use: Yes    Alcohol/week: 0.0 oz    Comment: rarely  . Drug use: No  . Sexual activity: Yes  Lifestyle  . Physical activity:    Days per week: Not on file    Minutes per session: Not on file  . Stress: Not on file  Relationships  . Social connections:    Talks on phone: Not on file    Gets together: Not on file    Attends religious service: Not on file    Active member of club or organization: Not on file    Attends meetings of clubs or organizations:  Not on file    Relationship status: Not on file  . Intimate partner violence:    Fear of current or ex partner: Not on file    Emotionally abused: Not on file    Physically abused: Not on file    Forced sexual activity: Not on file  Other Topics Concern  . Not on file  Social History Narrative  . Not on file    Past Surgical Hx:  Past Surgical History:  Procedure Laterality Date  . adnoidectomy    . BUNIONECTOMY Left    foot  . CESAREAN SECTION    . CHOLECYSTECTOMY    . ROBOTIC ASSISTED LAPAROSCOPIC HYSTERECTOMY AND SALPINGECTOMY N/A 01/19/2018   Procedure: XI ROBOTIC ASSISTED LAPAROSCOPIC HYSTERECTOMY AND BILATERAL SALPINGECTOMY, LEFT OVARIAN CYSTECTOMY;  Surgeon: Everitt Amber, MD;  Location: [facility];  Service: Gynecology;  Laterality: N/A;  . wisdom teeth exraction      Past Medical Hx:  Past Medical History:  Diagnosis Date  .  Allergy   . Anxiety   . Cancer ([place])    basal cell skin ca  . Depression   . Family history of adverse reaction to anesthesia    sister woke up during surgery age 32's for tonsillectomy  . Multiple sclerosis ([place])   . Neuromuscular disorder ([place])    multiple sclerosis  . Vision abnormalities     Past Gynecological History:  C/s x 1 and vaginal delivery x 1 Patient's last menstrual period was 07/13/2015 (exact date).  Family Hx:  Family History  Problem Relation Age of Onset  . Cervical cancer Mother   . Dementia Mother   . Melanoma Father   . Ulcerative colitis Sister   . Colon cancer Neg Hx   . Esophageal cancer Neg Hx   . Rectal cancer Neg Hx   . Stomach cancer Neg Hx     Review of Systems:  Constitutional  Feels well,    ENT Normal appearing ears and nares bilaterally Skin/Breast  No rash, sores, jaundice, itching, dryness Cardiovascular  No chest pain, shortness of breath, or edema  Pulmonary  No cough or wheeze.  Gastro Intestinal  No nausea, vomitting, or diarrhoea. No bright red blood per rectum, no abdominal pain,  change in bowel movement, or constipation.  Genito Urinary  No frequency, urgency, dysuria,       Musculo Skeletal  No myalgia, arthralgia, joint swelling or pain  Neurologic  No weakness, numbness, change in gait,  Psychology  No depression, anxiety, insomnia.   Vitals:  Blood pressure 116/80, pulse 74, temperature 98.1 F (36.7 C), temperature source Oral, resp. rate 20, height 5\' 4"  (1.626 m), weight 139 lb (63 kg), last menstrual period 07/13/2015, SpO2 99 %.  Physical Exam: WD in NAD Neck  Supple NROM, without any enlargements.  Lymph Node Survey deferred Cardiovascular  deferred Lungs  deferred Skin  No rash/lesions/breakdown  Psychiatry  Alert and oriented to person, place, and time  Abdomen  Normoactive bowel sounds, abdomen soft, non-tender and thin without evidence of hernia. Incisions are well healed. Back No CVA tenderness Genito Urinary  Vaginal cuff in tact and without blood or drainage.  Rectal  deferred Extremities  No bilateral cyanosis, clubbing or edema.    [name] Solo, MD  02/11/2018, 3:12 PM

## 2018-02-11 NOTE — Patient Instructions (Signed)
Please contact Dr Denman George at [phone] if you have any questions or concerns. Delay heavy lifting (2 handed lifting or weights greater than 10lbs) until you are 4 weeks from surgery.  You can swim.  Avoid intercourse until 2 months postop.

## 2018-02-19 ENCOUNTER — Encounter: Payer: Self-pay | Admitting: Gynecologic Oncology

## 2018-02-22 ENCOUNTER — Other Ambulatory Visit: Payer: Self-pay | Admitting: Gynecologic Oncology

## 2018-02-22 DIAGNOSIS — N8189 Other female genital prolapse: Secondary | ICD-10-CM

## 2018-02-23 ENCOUNTER — Ambulatory Visit: Payer: 59 | Attending: Gynecologic Oncology | Admitting: Physical Therapy

## 2018-02-23 ENCOUNTER — Other Ambulatory Visit: Payer: Self-pay

## 2018-02-23 DIAGNOSIS — M6281 Muscle weakness (generalized): Secondary | ICD-10-CM | POA: Diagnosis not present

## 2018-02-23 DIAGNOSIS — M62838 Other muscle spasm: Secondary | ICD-10-CM | POA: Insufficient documentation

## 2018-02-23 DIAGNOSIS — R279 Unspecified lack of coordination: Secondary | ICD-10-CM | POA: Diagnosis not present

## 2018-02-23 NOTE — Therapy (Signed)
[facility] [address], [place], [place], [address] Phone: [phone]   Fax:  [phone]  Physical Therapy Evaluation  Patient Details  Name: [name] [name] MRN: [id number] Date of Birth: 11/07/1970 Referring Provider: Dorothyann Gibbs, NP   Encounter Date: 02/23/2018  PT End of Session - 02/23/18 0841    Visit Number  1    Date for PT Re-Evaluation  04/20/18    Authorization Type  [place] [employer]    PT Start Time  0800    PT Stop Time  0840    PT Time Calculation (min)  40 min    Activity Tolerance  Patient tolerated treatment well    Behavior During Therapy  [facility] for tasks assessed/performed       Past Medical History:  Diagnosis Date  . Allergy   . Anxiety   . Cancer ([place])    basal cell skin ca  . Depression   . Family history of adverse reaction to anesthesia    sister woke up during surgery age 20's for tonsillectomy  . Multiple sclerosis ([place])   . Neuromuscular disorder ([place])    multiple sclerosis  . Vision abnormalities     Past Surgical History:  Procedure Laterality Date  . adnoidectomy    . BUNIONECTOMY Left    foot  . CESAREAN SECTION    . CHOLECYSTECTOMY    . ROBOTIC ASSISTED LAPAROSCOPIC HYSTERECTOMY AND SALPINGECTOMY N/A 01/19/2018   Procedure: XI ROBOTIC ASSISTED LAPAROSCOPIC HYSTERECTOMY AND BILATERAL SALPINGECTOMY, LEFT OVARIAN CYSTECTOMY;  Surgeon: Everitt Amber, MD;  Location: [facility];  Service: Gynecology;  Laterality: N/A;  . wisdom teeth exraction      There were no vitals filed for this visit.   Subjective Assessment - 02/23/18 0803    Subjective  Pt states the pain and constipation is still an issue since her hysterectomy.  Yesterday was first day back at work.    Pertinent History  MS    Limitations  Sitting;Standing    Patient Stated Goals  decrease pain    Currently in Pain?  Yes    Pain Score  3     Pain Location  Abdomen    Pain Orientation  Left;Right;Lower more on  the left    Pain Descriptors / Indicators  Nagging;Constant;Sharp    Pain Type  Surgical pain    Pain Onset  More than a month ago    Pain Frequency  Intermittent    Aggravating Factors   being up and walking around    Pain Relieving Factors  lying down, Motrin helps    Effect of Pain on Daily Activities  everything effected with pain    Multiple Pain Sites  No         [facility] PT Assessment - 02/23/18 0001      Assessment   Medical Diagnosis  N81.89 (ICD-10-CM) - Pelvic floor weakness in female    Referring Provider  Dorothyann Gibbs, NP    Onset Date/Surgical Date  01/18/18    Prior Therapy  No      Precautions   Precautions  Other (comment)    Precaution Comments  Nothing internal at this time      Restrictions   Weight Bearing Restrictions  No      Balance Screen   Has the patient fallen in the past 6 months  No      [place] residence  Living Arrangements  Children daughter      Prior Function   Level of Independence  Independent    Vocation  Full time employment    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    Leisure  exercise, walking      Cognition   Overall Cognitive Status  Within Functional Limits for tasks assessed      Posture/Postural Control   Posture/Postural Control  Postural limitations    Postural Limitations  Rounded Shoulders      ROM / Strength   AROM / PROM / Strength  PROM;Strength      PROM   PROM Assessment Site  Hip    Right/Left Hip  Right;Left    Right Hip Internal Rotation   30    Left Hip Flexion  90 pain    Left Hip Internal Rotation   10      Strength   Overall Strength Comments  left LE 4/5 hip, knee, ankle; right LE 5/5      Right Hip   Right Hip Flexion  110      Flexibility   Soft Tissue Assessment /Muscle Length  yes    Hamstrings  90% right; 75% left      Palpation   Palpation comment  abdominal fascal restriction and swelling on left along  descending colon  especially left lower quarter; increased glute and adductor activation with kegel      Ambulation/Gait   Gait Pattern  Decreased stride length                Objective measurements completed on examination: See above findings.    Pelvic Floor Special Questions - 02/23/18 0001    Prior Pelvic/Prostate Exam  Yes    Date of Last Pelvic/Prostate Exam  02/22/18    Are you Pregnant or attempting pregnancy?  No    Prior Pregnancies  Yes    Number of Pregnancies  2    Number of C-Sections  1    Number of Vaginal Deliveries  1    Any difficulty with labor and deliveries  No    Currently Sexually Active  Yes not since surgery    Is this Painful  -- unsure    Urinary Leakage  Yes vaginal discharge yesterday; leakage prior to surgery    How often  with exercise    Pad use  no    Activities that cause leaking  Exercising;Coughing;Sneezing;Intercourse    Urinary urgency  Yes    Fecal incontinence  -- constipation    Falling out feeling (prolapse)  No    External Perineal Exam  palpated external to pants    Exam Type  Deferred       [facility] Adult PT Treatment/Exercise - 02/23/18 0001      Self-Care   Self-Care  Other Self-Care Comments    Other Self-Care Comments   ab massage and urge to void; hip flexor stretch on step and supine      Manual Therapy   Manual Therapy  Myofascial release    Manual therapy comments  abdominal fascial release             PT Education - 02/23/18 0840    Education Details  ab massage and urge to void    Person(s) Educated  Patient    Methods  Explanation;Demonstration;Handout    Comprehension  Verbalized understanding;Returned demonstration       PT Short Term Goals - 02/23/18 [phone]  PT SHORT TERM GOAL #1   Title  pt will be ind with abdominal massage    Time  2    Period  Weeks    Status  New    Target Date  03/09/18      PT SHORT TERM GOAL #2   Title  pt will be ind with initial HEP    Time  4    Period  Weeks    Status   New    Target Date  03/23/18        PT Long Term Goals - 02/23/18 0903      PT LONG TERM GOAL #1   Title  Pt will be ind with advanced HEP    Time  8    Period  Weeks    Status  New    Target Date  04/20/18      PT LONG TERM GOAL #2   Title  pt will demonstrate full pain free hip flexion biltarally in order to be able to perform sitting tasks    Time  8    Period  Weeks    Status  New    Target Date  04/20/18      PT LONG TERM GOAL #3   Title  pt will report 60% less pain during typical work day    Time  8    Period  Weeks    Status  New    Target Date  04/20/18      PT LONG TERM GOAL #4   Title  Pt will be able to perform kegel of at least 2/5 MMT and hold >8 seconds in order to demonstrate strength and endurace for activities.    Time  8    Period  Weeks    Status  New    Target Date  04/20/18             Plan - 02/23/18 0849    Clinical Impression Statement  Pt presents to skilled PT due to lower abdominal pain since hysterectomy 5 weeks ago. Patient reports pain increases as she is up throughout the day.  Pt has gait and posture abnormalities as mentioned above.  Lying down is the only time there is no pain. Pt has weakness of left LE.  Pt has tight hamstrings Lt> Rt. She demonstrates decreased hip flexion and IR of left side.  Pt has fascial adhesion in abdomen and tender to palpation especially the lower left.  abdomen feels more swollen on right side. Pt uses co-contraction of glutes and adductors when performing kegel.  She has not had urinary leakage recently due to less activity, but does experience when participating in more rigorous exercises.  Pt will benefit from skilled PT to address impairments and return to maximum function.    History and Personal Factors relevant to plan of care:  MS, 1 vaginal delivery, hysterectomy    Clinical Presentation  Evolving    Clinical Presentation due to:  pain has been worse being up all day at work    Clinical Decision  Making  Moderate    Rehab Potential  Excellent    Clinical Impairments Affecting Rehab Potential  MS that can cause muscle spasm    PT Frequency  2x / week    PT Duration  8 weeks    PT Treatment/Interventions  Biofeedback;ADLs/Self Care Home Management;Electrical Stimulation;Moist Heat;Cryotherapy;Therapeutic activities;Therapeutic exercise;Neuromuscular re-education;Patient/family education;Manual techniques;Dry needling;Taping;Passive range of motion    PT Next Visit Plan  breathing and bulging, toileting, stretches, biofeedback, stim and heat for pain management    Consulted and Agree with Plan of Care  Patient       Patient will benefit from skilled therapeutic intervention in order to improve the following deficits and impairments:  Pain, Increased fascial restricitons, Postural dysfunction, Decreased strength, Decreased range of motion, Increased muscle spasms  Visit Diagnosis: Other muscle spasm  Muscle weakness (generalized)  Unspecified lack of coordination     Problem List Patient Active Problem List   Diagnosis Date Noted  . Adenomyosis 01/19/2018  . Abnormal uterine bleeding (AUB) 01/19/2018  . Abnormal uterine bleeding 01/19/2018  . Spider veins of both lower extremities 11/02/2017  . Optic neuritis 10/12/2017  . Other fatigue 10/12/2017  . Numbness 10/12/2017  . Gait disturbance 10/12/2017    Zannie Cove, PT 02/23/2018, 10:33 AM  [facility] [address] [place], [place], [address] Phone: [phone]   Fax:  [phone]  Name: [name] [name] MRN: [id number] Date of Birth: 08/18/1971

## 2018-02-23 NOTE — Patient Instructions (Signed)
Relaxation Exercises with the Urge to Void   When you experience an urge to void:  FIRST  Stop and stand very still    Sit down if you can    Don't move    You need to stay very still to maintain control  SECOND Squeeze your pelvic floor muscles 5 times, like a quick flick, to keep from leaking  THIRD Relax  Take a deep breath and then let it out  Try to make the urge go away by using relaxation and visualization techniques  FINALLY When you feel the urge go away somewhat, walk normally to the bathroom.   If the urge gets suddenly stronger on the way, you may stop again and relax to regain control.   About Abdominal Massage  Abdominal massage, also called external colon massage, is a self-treatment circular massage technique that can reduce and eliminate gas and ease constipation. The colon naturally contracts in waves in a clockwise direction starting from inside the right hip, moving up toward the ribs, across the belly, and down inside the left hip.  When you perform circular abdominal massage, you help stimulate your colon's normal wave pattern of movement called peristalsis.  It is most beneficial when done after eating.  Positioning You can practice abdominal massage with oil while lying down, or in the shower with soap.  Some people find that it is just as effective to do the massage through clothing while sitting or standing.  How to Massage Start by placing your finger tips or knuckles on your right side, just inside your hip bone.  . Make small circular movements while you move upward toward your rib cage.   . Once you reach the bottom right side of your rib cage, take your circular movements across to the left side of the bottom of your rib cage.  . Next, move downward until you reach the inside of your left hip bone.  This is the path your feces travel in your colon. . Continue to perform your abdominal massage in this pattern for 10 minutes each day.     You can  apply as much pressure as is comfortable in your massage.  Start gently and build pressure as you continue to practice.  Notice any areas of pain as you massage; areas of slight pain may be relieved as you massage, but if you have areas of significant or intense pain, consult with your healthcare provider.  Other Considerations . General physical activity including bending and stretching can have a beneficial massage-like effect on the colon.  Deep breathing can also stimulate the colon because breathing deeply activates the same nervous system that supplies the colon.   . Abdominal massage should always be used in combination with a bowel-conscious diet that is high in the proper type of fiber for you, fluids (primarily water), and a regular exercise program.  [facility] [address], [place], [place] [address] Phone # [phone] Fax [phone]

## 2018-03-01 ENCOUNTER — Ambulatory Visit: Payer: 59 | Admitting: Physical Therapy

## 2018-03-01 DIAGNOSIS — M6281 Muscle weakness (generalized): Secondary | ICD-10-CM | POA: Diagnosis not present

## 2018-03-01 DIAGNOSIS — M62838 Other muscle spasm: Secondary | ICD-10-CM

## 2018-03-01 DIAGNOSIS — R279 Unspecified lack of coordination: Secondary | ICD-10-CM | POA: Diagnosis not present

## 2018-03-01 NOTE — Patient Instructions (Addendum)
Toileting Techniques for Bowel Movements (Defecation) Using your belly (abdomen) and pelvic floor muscles to have a bowel movement is usually instinctive.  Sometimes people can have problems with these muscles and have to relearn proper defecation (emptying) techniques.  If you have weakness in your muscles, organs that are falling out, decreased sensation in your pelvis, or ignore your urge to go, you may find yourself straining to have a bowel movement.  You are straining if you are: . holding your breath or taking in a huge gulp of air and holding it  . keeping your lips and jaw tensed and closed tightly . turning red in the face because of excessive pushing or forcing . developing or worsening your  hemorrhoids . getting faint while pushing . not emptying completely and have to defecate many times a day  If you are straining, you are actually making it harder for yourself to have a bowel movement.  Many people find they are pulling up with the pelvic floor muscles and closing off instead of opening the anus. Due to lack pelvic floor relaxation and coordination the abdominal muscles, one has to work harder to push the feces out.  Many people have never been taught how to defecate efficiently and effectively.  Notice what happens to your body when you are having a bowel movement.  While you are sitting on the toilet pay attention to the following areas: . Jaw and mouth position . Angle of your hips   . Whether your feet touch the ground or not . Arm placement  . Spine position . Waist . Belly tension . Anus (opening of the anal canal)  An Evacuation/Defecation Plan   Here are the 4 basic points:  1. Lean forward enough for your elbows to rest on your knees 2. Support your feet on the floor or use a low stool if your feet don't touch the floor  3. Push out your belly as if you have swallowed a beach ball-you should feel a widening of your waist 4. Open and relax your pelvic floor muscles,  rather than tightening around the anus      The following conditions my require modifications to your toileting posture:  . If you have had surgery in the past that limits your back, hip, pelvic, knee or ankle flexibility . Constipation   Your healthcare practitioner may make the following additional suggestions and adjustments:  1) Sit on the toilet  a) Make sure your feet are supported. b) Notice your hip angle and spine position-most people find it effective to lean forward or raise their knees, which can help the muscles around the anus to relax  c) When you lean forward, place your forearms on your thighs for support  2) Relax suggestions a) Breath deeply in through your nose and out slowly through your mouth as if you are smelling the flowers and blowing out the candles. b) To become aware of how to relax your muscles, contracting and releasing muscles can be helpful.  Pull your pelvic floor muscles in tightly by using the image of holding back gas, or closing around the anus (visualize making a circle smaller) and lifting the anus up and in.  Then release the muscles and your anus should drop down and feel open. Repeat 5 times ending with the feeling of relaxation. c) Keep your pelvic floor muscles relaxed; let your belly bulge out. d) The digestive tract starts at the mouth and ends at the anal opening, so be   sure to relax both ends of the tube.  Place your tongue on the roof of your mouth with your teeth separated.  This helps relax your mouth and will help to relax the anus at the same time.  3) Empty (defecation) a) Keep your pelvic floor and sphincter relaxed, then bulge your anal muscles.  Make the anal opening wide.  b) Stick your belly out as if you have swallowed a beach ball. c) Make your belly wall hard using your belly muscles while continuing to breathe. Doing this makes it easier to open your anus. d) Breath out and give a grunt (or try using other sounds such as  ahhhh, shhhhh, ohhhh or grrrrrrr).  4) Finish a) As you finish your bowel movement, pull the pelvic floor muscles up and in.  This will leave your anus in the proper place rather than remaining pushed out and down. If you leave your anus pushed out and down, it will start to feel as though that is normal and give you incorrect signals about needing to have a bowel movement.    [facility] [place], [place] [address]  Access Code: [email or web address]  URL: [email or web address]  Date: 03/01/2018  Prepared by: Lovett Calender   Exercises  Supine Diaphragmatic Breathing with Pelvic Floor Lengthening - 10 reps - 1 sets - 1x daily - 7x weekly  Supine Hamstring Stretch with Strap - 3 reps - 1 sets - 30 sec hold - 1x daily - 7x weekly  Supine Single Knee to Chest Stretch - 5 reps - 1 sets - 10 sec hold - 2x daily - 7x weekly  Supine Hip Adductor Stretch - 3 reps - 1 sets - 30 sec hold - 1x daily - 7x weekly  Hip Flexor Stretch on Step - 3 reps - 1 sets - 30 sec hold - 1x daily - 7x weekly

## 2018-03-01 NOTE — Therapy (Signed)
[facility] [address] [place], [place], [address] Phone: [phone]   Fax:  [phone]  Physical Therapy Treatment  Patient Details  Name: [name] [name] MRN: [id number] Date of Birth: [birth year] Referring Provider: Dorothyann Gibbs, NP   Encounter Date: 03/01/2018  PT End of Session - 03/01/18 1207    Visit Number  2    Date for PT Re-Evaluation  04/20/18    Authorization Type   UMR    PT Start Time  1100    PT Stop Time  1140    PT Time Calculation (min)  40 min    Activity Tolerance  Patient tolerated treatment well    Behavior During Therapy  [facility] for tasks assessed/performed       Past Medical History:  Diagnosis Date  . Allergy   . Anxiety   . Cancer ([place])    basal cell skin ca  . Depression   . Family history of adverse reaction to anesthesia    sister woke up during surgery age 42's for tonsillectomy  . Multiple sclerosis ([place])   . Neuromuscular disorder ([place])    multiple sclerosis  . Vision abnormalities     Past Surgical History:  Procedure Laterality Date  . adnoidectomy    . BUNIONECTOMY Left    foot  . CESAREAN SECTION    . CHOLECYSTECTOMY    . ROBOTIC ASSISTED LAPAROSCOPIC HYSTERECTOMY AND SALPINGECTOMY N/A 01/19/2018   Procedure: XI ROBOTIC ASSISTED LAPAROSCOPIC HYSTERECTOMY AND BILATERAL SALPINGECTOMY, LEFT OVARIAN CYSTECTOMY;  Surgeon: Everitt Amber, MD;  Location: [facility];  Service: Gynecology;  Laterality: N/A;  . wisdom teeth exraction      There were no vitals filed for this visit.  Subjective Assessment - 03/01/18 1102    Subjective  Feeling better.  I rested and used heat over the weekend    Pertinent History  MS    Limitations  Sitting;Standing    Patient Stated Goals  decrease pain    Currently in Pain?  Yes    Pain Score  0-No pain    Pain Location  --    Pain Orientation  -- left                       [facility] Adult PT Treatment/Exercise -  03/01/18 0001      Self-Care   Other Self-Care Comments   toileting techniques      Lumbar Exercises: Stretches   Active Hamstring Stretch  Right;Left;60 seconds    Single Knee to Chest Stretch  Right;Left;5 reps;10 seconds    Hip Flexor Stretch  Right;Left;3 reps;30 seconds    Figure 4 Stretch  2 reps;20 seconds    Other Lumbar Stretch Exercise  thoracic rotation in sitting - 30 sec     Other Lumbar Stretch Exercise  hip adduction stretch - butterfly - 3 x 30 sec      Manual Therapy   Manual Therapy  Soft tissue mobilization    Manual therapy comments  prone with pillow under hips    Soft tissue mobilization  lumbar parspinals and QL (right > left)             PT Education - 03/01/18 1207    Education Details   Access Code: [email or web address] ; toileting tech    Person(s) Educated  Patient    Methods  Explanation;Demonstration;Handout;Verbal cues    Comprehension  Verbalized understanding;Returned demonstration  PT Short Term Goals - 03/01/18 1424      PT SHORT TERM GOAL #1   Title  pt will be ind with abdominal massage    Time  2    Period  Weeks    Status  Achieved      PT SHORT TERM GOAL #2   Title  pt will be ind with initial HEP    Time  4    Period  Weeks    Status  On-going        PT Long Term Goals - 02/23/18 0903      PT LONG TERM GOAL #1   Title  Pt will be ind with advanced HEP    Time  8    Period  Weeks    Status  New    Target Date  04/20/18      PT LONG TERM GOAL #2   Title  pt will demonstrate full pain free hip flexion biltarally in order to be able to perform sitting tasks    Time  8    Period  Weeks    Status  New    Target Date  04/20/18      PT LONG TERM GOAL #3   Title  pt will report 60% less pain during typical work day    Time  8    Period  Weeks    Status  New    Target Date  04/20/18      PT LONG TERM GOAL #4   Title  Pt will be able to perform kegel of at least 2/5 MMT and hold >8 seconds in order to demonstrate  strength and endurace for activities.    Time  8    Period  Weeks    Status  New    Target Date  04/20/18            Plan - 03/01/18 1421    Clinical Impression Statement  Pt is doing much better overall.  She was able to have BM without a laxitive.  Pt understands how to use toileting techniques.  She responded well to manual with increased soft tissue length.  Pt educated on stretches to continue to work on improved soft tissue length and reduced fascial adheisons.  Pt will benefit from skilled PT.    PT Treatment/Interventions  Biofeedback;ADLs/Self Care Home Management;Electrical Stimulation;Moist Heat;Cryotherapy;Therapeutic activities;Therapeutic exercise;Neuromuscular re-education;Patient/family education;Manual techniques;Dry needling;Taping;Passive range of motion    PT Next Visit Plan  breathing and bulging, stretches, biofeedback, stim and heat for pain management       Patient will benefit from skilled therapeutic intervention in order to improve the following deficits and impairments:  Pain, Increased fascial restricitons, Postural dysfunction, Decreased strength, Decreased range of motion, Increased muscle spasms  Visit Diagnosis: Other muscle spasm  Muscle weakness (generalized)  Unspecified lack of coordination     Problem List Patient Active Problem List   Diagnosis Date Noted  . Adenomyosis 01/19/2018  . Abnormal uterine bleeding (AUB) 01/19/2018  . Abnormal uterine bleeding 01/19/2018  . Spider veins of both lower extremities 11/02/2017  . Optic neuritis 10/12/2017  . Other fatigue 10/12/2017  . Numbness 10/12/2017  . Gait disturbance 10/12/2017    Zannie Cove, PT 03/01/2018, 2:24 PM  [facility] [address], [place], [place], [address] Phone: [phone]   Fax:  [phone]  Name: [name] [name] MRN: [id number] Date of Birth: 1970-11-26

## 2018-03-03 ENCOUNTER — Telehealth: Payer: Self-pay | Admitting: Physical Therapy

## 2018-03-03 ENCOUNTER — Ambulatory Visit: Payer: 59 | Admitting: Physical Therapy

## 2018-03-03 NOTE — Telephone Encounter (Signed)
Pt called due to no show.  She had lost her wallet and forgot to call due to rushing to take care of missing personal information.  Zannie Cove, PT 03/03/18 9:09 AM

## 2018-03-08 ENCOUNTER — Ambulatory Visit: Payer: 59 | Attending: Gynecologic Oncology | Admitting: Physical Therapy

## 2018-03-08 ENCOUNTER — Encounter: Payer: Self-pay | Admitting: Physical Therapy

## 2018-03-08 DIAGNOSIS — R279 Unspecified lack of coordination: Secondary | ICD-10-CM | POA: Insufficient documentation

## 2018-03-08 DIAGNOSIS — M6281 Muscle weakness (generalized): Secondary | ICD-10-CM | POA: Diagnosis not present

## 2018-03-08 DIAGNOSIS — M62838 Other muscle spasm: Secondary | ICD-10-CM | POA: Diagnosis not present

## 2018-03-08 NOTE — Patient Instructions (Signed)
Access Code: [email or web address]  URL: [email or web address]  Date: 03/08/2018  Prepared by: Lovett Calender   Exercises  Supine Diaphragmatic Breathing with Pelvic Floor Lengthening - 10 reps - 1 sets - 1x daily - 7x weekly  Supine Hamstring Stretch with Strap - 3 reps - 1 sets - 30 sec hold - 1x daily - 7x weekly  Supine Single Knee to Chest Stretch - 5 reps - 1 sets - 10 sec hold - 2x daily - 7x weekly  Supine Hip Adductor Stretch - 3 reps - 1 sets - 30 sec hold - 1x daily - 7x weekly  Hip Flexor Stretch on Step - 3 reps - 1 sets - 30 sec hold - 1x daily - 7x weekly  Seated Piriformis Stretch with Trunk Bend - 3 reps - 1 sets - 30 sec hold - 1x daily - 7x weekly  Supine Piriformis Stretch - 3 reps - 1 sets - 30 sec hold - 1x daily - 7x weekly

## 2018-03-08 NOTE — Therapy (Signed)
[facility] [address] [place], [place], [address] Phone: [phone]   Fax:  [phone]  Physical Therapy Treatment  Patient Details  Name: [name] [name] MRN: [id number] Date of Birth: December 31, 1970 Referring Provider: Dorothyann Gibbs, NP   Encounter Date: 03/08/2018  PT End of Session - 03/08/18 0814    Visit Number  3    Date for PT Re-Evaluation  04/20/18    Authorization Type  [place] UMR    PT Start Time  0802    PT Stop Time  0841    PT Time Calculation (min)  39 min    Activity Tolerance  Patient tolerated treatment well    Behavior During Therapy  [facility] for tasks assessed/performed       Past Medical History:  Diagnosis Date  . Allergy   . Anxiety   . Cancer ([place])    basal cell skin ca  . Depression   . Family history of adverse reaction to anesthesia    sister woke up during surgery age 3's for tonsillectomy  . Multiple sclerosis ([place])   . Neuromuscular disorder ([place])    multiple sclerosis  . Vision abnormalities     Past Surgical History:  Procedure Laterality Date  . adnoidectomy    . BUNIONECTOMY Left    foot  . CESAREAN SECTION    . CHOLECYSTECTOMY    . ROBOTIC ASSISTED LAPAROSCOPIC HYSTERECTOMY AND SALPINGECTOMY N/A 01/19/2018   Procedure: XI ROBOTIC ASSISTED LAPAROSCOPIC HYSTERECTOMY AND BILATERAL SALPINGECTOMY, LEFT OVARIAN CYSTECTOMY;  Surgeon: Everitt Amber, MD;  Location: [facility];  Service: Gynecology;  Laterality: N/A;  . wisdom teeth exraction      There were no vitals filed for this visit.  Subjective Assessment - 03/08/18 0804    Subjective  I am feeling better.  A lot of pressure was relieved when something drained and I think that's what was causing the pain.  I haven't had any pain since then and my bowels are still a little hard to move.      Pertinent History  MS    Limitations  Sitting;Standing    Patient Stated Goals  decrease pain    Currently in Pain?  No/denies                        [facility] Adult PT Treatment/Exercise - 03/08/18 0001      Neuro Re-ed    Neuro Re-ed Details   biofeedback with stretches, sitting on ball circles and bulging      Lumbar Exercises: Stretches   Single Knee to Chest Stretch  Right;Left;5 reps;10 seconds    Figure 4 Stretch  2 reps;20 seconds      Lumbar Exercises: Seated   Hip Flexion on Ball  Strengthening;Both;5 reps    Other Seated Lumbar Exercises  circles on ball and breathing and bulging    Other Seated Lumbar Exercises  contract and "zipping up" on ball - 10 x      Lumbar Exercises: Supine   Ab Set  10 reps;5 seconds    Bent Knee Raise  20 reps    Large Ball Abdominal Isometric  15 reps;3 seconds    Other Supine Lumbar Exercises  ball roll outs LE             PT Education - 03/08/18 0845    Education Details   Access Code: [email or web address]     Person(s) Educated  Patient  Methods  Explanation;Demonstration;Handout    Comprehension  Returned demonstration;Verbalized understanding       PT Short Term Goals - 03/08/18 0901      PT SHORT TERM GOAL #2   Title  pt will be ind with initial HEP    Time  4    Period  Weeks    Status  Achieved        PT Long Term Goals - 02/23/18 0903      PT LONG TERM GOAL #1   Title  Pt will be ind with advanced HEP    Time  8    Period  Weeks    Status  New    Target Date  04/20/18      PT LONG TERM GOAL #2   Title  pt will demonstrate full pain free hip flexion biltarally in order to be able to perform sitting tasks    Time  8    Period  Weeks    Status  New    Target Date  04/20/18      PT LONG TERM GOAL #3   Title  pt will report 60% less pain during typical work day    Time  8    Period  Weeks    Status  New    Target Date  04/20/18      PT LONG TERM GOAL #4   Title  Pt will be able to perform kegel of at least 2/5 MMT and hold >8 seconds in order to demonstrate strength and endurace for activities.    Time  8    Period  Weeks     Status  New    Target Date  04/20/18            Plan - 03/08/18 5176    Clinical Impression Statement  Pt is doing well . She has high tone of pelvic floor and did well with biofeedback to work on retraining muscle tone.  Pt performed exercises correctly and without any pain today.  Pt will continue with POC.    PT Treatment/Interventions  Biofeedback;ADLs/Self Care Home Management;Electrical Stimulation;Moist Heat;Cryotherapy;Therapeutic activities;Therapeutic exercise;Neuromuscular re-education;Patient/family education;Manual techniques;Dry needling;Taping;Passive range of motion    PT Next Visit Plan  start with biofeedback, breathing and bulging    Consulted and Agree with Plan of Care  Patient       Patient will benefit from skilled therapeutic intervention in order to improve the following deficits and impairments:  Pain, Increased fascial restricitons, Postural dysfunction, Decreased strength, Decreased range of motion, Increased muscle spasms  Visit Diagnosis: Other muscle spasm  Muscle weakness (generalized)  Unspecified lack of coordination     Problem List Patient Active Problem List   Diagnosis Date Noted  . Adenomyosis 01/19/2018  . Abnormal uterine bleeding (AUB) 01/19/2018  . Abnormal uterine bleeding 01/19/2018  . Spider veins of both lower extremities 11/02/2017  . Optic neuritis 10/12/2017  . Other fatigue 10/12/2017  . Numbness 10/12/2017  . Gait disturbance 10/12/2017    Zannie Cove, PT 03/08/2018, 9:03 AM  [facility] [address], [place], [place], [address] Phone: [phone]   Fax:  [phone]  Name: [name] [name] MRN: [id number] Date of Birth: 1971/06/30

## 2018-03-09 MED FILL — ESTRADIOL 0.1 MG/24HR PTTW: 0.1 | 28 days supply | Qty: 8 | Fill #1

## 2018-03-16 ENCOUNTER — Ambulatory Visit: Payer: 59 | Admitting: Physical Therapy

## 2018-03-16 DIAGNOSIS — M6281 Muscle weakness (generalized): Secondary | ICD-10-CM

## 2018-03-16 DIAGNOSIS — R279 Unspecified lack of coordination: Secondary | ICD-10-CM

## 2018-03-16 DIAGNOSIS — M62838 Other muscle spasm: Secondary | ICD-10-CM | POA: Diagnosis not present

## 2018-03-16 NOTE — Patient Instructions (Signed)
Access Code: [email or web address]  URL: [email or web address]  Date: 03/16/2018  Prepared by: Lovett Calender   Exercises  Supine Diaphragmatic Breathing with Pelvic Floor Lengthening - 10 reps - 1 sets - 1x daily - 7x weekly  Supine Hamstring Stretch with Strap - 3 reps - 1 sets - 30 sec hold - 1x daily - 7x weekly  Supine Single Knee to Chest Stretch - 5 reps - 1 sets - 10 sec hold - 2x daily - 7x weekly  Supine Hip Adductor Stretch - 3 reps - 1 sets - 30 sec hold - 1x daily - 7x weekly  Hip Flexor Stretch on Step - 3 reps - 1 sets - 30 sec hold - 1x daily - 7x weekly  Seated Piriformis Stretch with Trunk Bend - 3 reps - 1 sets - 30 sec hold - 1x daily - 7x weekly  Supine Piriformis Stretch - 3 reps - 1 sets - 30 sec hold - 1x daily - 7x weekly  Ball squeeze with Kegel - 10 reps - 1 sets - 3 sec hold - 3x daily - 7x weekly   Dilator and ice to massage pelvic floor muscles  [facility] [address], [place], [place] [address] Phone # [phone] Fax [phone]

## 2018-03-16 NOTE — Therapy (Signed)
[facility] [address], [place], [place], [address] Phone: [phone]   Fax:  [phone]  Physical Therapy Treatment  Patient Details  Name: [name] [name] MRN: [id number] Date of Birth: 07/20/1971 Referring Provider: Dorothyann Gibbs, NP   Encounter Date: 03/16/2018  PT End of Session - 03/16/18 0807    Visit Number  4    Date for PT Re-Evaluation  04/20/18    Authorization Type  [place] [employer]    PT Start Time  0802    PT Stop Time  0842    PT Time Calculation (min)  40 min    Activity Tolerance  Patient tolerated treatment well    Behavior During Therapy  [facility] for tasks assessed/performed       Past Medical History:  Diagnosis Date  . Allergy   . Anxiety   . Cancer ([place])    basal cell skin ca  . Depression   . Family history of adverse reaction to anesthesia    sister woke up during surgery age 68's for tonsillectomy  . Multiple sclerosis ([place])   . Neuromuscular disorder ([place])    multiple sclerosis  . Vision abnormalities     Past Surgical History:  Procedure Laterality Date  . adnoidectomy    . BUNIONECTOMY Left    foot  . CESAREAN SECTION    . CHOLECYSTECTOMY    . ROBOTIC ASSISTED LAPAROSCOPIC HYSTERECTOMY AND SALPINGECTOMY N/A 01/19/2018   Procedure: XI ROBOTIC ASSISTED LAPAROSCOPIC HYSTERECTOMY AND BILATERAL SALPINGECTOMY, LEFT OVARIAN CYSTECTOMY;  Surgeon: Everitt Amber, MD;  Location: [facility];  Service: Gynecology;  Laterality: N/A;  . wisdom teeth exraction      There were no vitals filed for this visit.  Subjective Assessment - 03/16/18 0805    Subjective  I am more tender today.  I had sex last night.    Currently in Pain?  Yes    Pain Score  2     Pain Location  Abdomen    Pain Orientation  Left    Pain Descriptors / Indicators  Tender    Pain Type  Surgical pain    Pain Onset  More than a month ago    Pain Frequency  Intermittent    Aggravating Factors   intercourse    Multiple  Pain Sites  No                       [facility] Adult PT Treatment/Exercise - 03/16/18 0001      Self-Care   Other Self-Care Comments   educated in using ice      Neuro Re-ed    Neuro Re-ed Details   tactile and verbal cues to lift pelvic floor up and in; breathing and bulging in between reps      Lumbar Exercises: Aerobic   Nustep  L1 x 5.5 min PT present to discuss status      Lumbar Exercises: Supine   Ab Set  10 reps;5 seconds    Bent Knee Raise  20 reps    Large Ball Abdominal Isometric  15 reps;3 seconds    Other Supine Lumbar Exercises  ball roll outs LE      Manual Therapy   Manual Therapy  Internal Pelvic Floor    Soft tissue mobilization  STM and fascial release around left side, TP, bulbocavernosis, OI    Internal Pelvic Floor  pt informed and consent given to perform internal soft tissue assessment  PT Education - 03/16/18 0846    Education Details   Access Code: [email or web address]     Person(s) Educated  Patient    Methods  Explanation;Demonstration;Handout    Comprehension  Verbalized understanding;Returned demonstration       PT Short Term Goals - 03/08/18 0901      PT SHORT TERM GOAL #2   Title  pt will be ind with initial HEP    Time  4    Period  Weeks    Status  Achieved        PT Long Term Goals - 02/23/18 0903      PT LONG TERM GOAL #1   Title  Pt will be ind with advanced HEP    Time  8    Period  Weeks    Status  New    Target Date  04/20/18      PT LONG TERM GOAL #2   Title  pt will demonstrate full pain free hip flexion biltarally in order to be able to perform sitting tasks    Time  8    Period  Weeks    Status  New    Target Date  04/20/18      PT LONG TERM GOAL #3   Title  pt will report 60% less pain during typical work day    Time  8    Period  Weeks    Status  New    Target Date  04/20/18      PT LONG TERM GOAL #4   Title  Pt will be able to perform kegel of at least 2/5 MMT and hold >8 seconds in  order to demonstrate strength and endurace for activities.    Time  8    Period  Weeks    Status  New    Target Date  04/20/18            Plan - 03/16/18 0846    Clinical Impression Statement  Pt tolerated internal soft tissue work with gentle pressure.  Left side is inflamed and demonstrates 1/5 MMT.  She was able to get small kegel contraction with exercises and some co-contraction of adductors and glutes.  Pt will benefit from skilled PT to porgress core and pelvic floor strength.    PT Treatment/Interventions  Biofeedback;ADLs/Self Care Home Management;Electrical Stimulation;Moist Heat;Cryotherapy;Therapeutic activities;Therapeutic exercise;Neuromuscular re-education;Patient/family education;Manual techniques;Dry needling;Taping;Passive range of motion    PT Home Exercise Plan   Access Code: [email or web address]     Recommended Other Services  eval 02/23/18; order signed    Consulted and Agree with Plan of Care  Patient       Patient will benefit from skilled therapeutic intervention in order to improve the following deficits and impairments:  Pain, Increased fascial restricitons, Postural dysfunction, Decreased strength, Decreased range of motion, Increased muscle spasms  Visit Diagnosis: Other muscle spasm  Muscle weakness (generalized)  Unspecified lack of coordination     Problem List Patient Active Problem List   Diagnosis Date Noted  . Adenomyosis 01/19/2018  . Abnormal uterine bleeding (AUB) 01/19/2018  . Abnormal uterine bleeding 01/19/2018  . Spider veins of both lower extremities 11/02/2017  . Optic neuritis 10/12/2017  . Other fatigue 10/12/2017  . Numbness 10/12/2017  . Gait disturbance 10/12/2017    Zannie Cove, PT 03/16/2018, 9:03 AM  [facility] [address], [place], [place], [address] Phone: [phone]   Fax:  [phone]  Name: [name] [name] MRN:  [id number] Date of Birth: 14-Sep-1970

## 2018-03-19 ENCOUNTER — Ambulatory Visit: Payer: 59 | Admitting: Physical Therapy

## 2018-03-19 DIAGNOSIS — R279 Unspecified lack of coordination: Secondary | ICD-10-CM | POA: Diagnosis not present

## 2018-03-19 DIAGNOSIS — M6281 Muscle weakness (generalized): Secondary | ICD-10-CM | POA: Diagnosis not present

## 2018-03-19 DIAGNOSIS — M62838 Other muscle spasm: Secondary | ICD-10-CM | POA: Diagnosis not present

## 2018-03-19 NOTE — Therapy (Addendum)
[facility] [address] [place], [place], [address] Phone: [phone]   Fax:  [phone]  Physical Therapy Treatment  Patient Details  Name: [name] [name] MRN: [id number] Date of Birth: 12-26-1970 Referring Provider: Dorothyann Gibbs, NP   Encounter Date: 03/19/2018  PT End of Session - 03/19/18 1104    Visit Number  5    Date for PT Re-Evaluation  04/20/18    PT Start Time  0845    PT Stop Time  0935    PT Time Calculation (min)  50 min    Activity Tolerance  Patient tolerated treatment well    Behavior During Therapy  [facility] for tasks assessed/performed       Past Medical History:  Diagnosis Date  . Allergy   . Anxiety   . Cancer ([place])    basal cell skin ca  . Depression   . Family history of adverse reaction to anesthesia    sister woke up during surgery age 28's for tonsillectomy  . Multiple sclerosis ([place])   . Neuromuscular disorder ([place])    multiple sclerosis  . Vision abnormalities     Past Surgical History:  Procedure Laterality Date  . adnoidectomy    . BUNIONECTOMY Left    foot  . CESAREAN SECTION    . CHOLECYSTECTOMY    . ROBOTIC ASSISTED LAPAROSCOPIC HYSTERECTOMY AND SALPINGECTOMY N/A 01/19/2018   Procedure: XI ROBOTIC ASSISTED LAPAROSCOPIC HYSTERECTOMY AND BILATERAL SALPINGECTOMY, LEFT OVARIAN CYSTECTOMY;  Surgeon: Everitt Amber, MD;  Location: [facility];  Service: Gynecology;  Laterality: N/A;  . wisdom teeth exraction      There were no vitals filed for this visit.  Subjective Assessment - 03/19/18 0850    Subjective  I had some drainage again.  I am okay today but not great.      Patient Stated Goals  decrease pain    Currently in Pain?  Yes    Pain Score  2     Pain Location  Abdomen    Pain Orientation  Left    Pain Descriptors / Indicators  Tender    Pain Type  Surgical pain    Pain Onset  More than a month ago    Pain Frequency  Intermittent    Aggravating Factors   comes and  goes    Multiple Pain Sites  No                       [facility] Adult PT Treatment/Exercise - 03/19/18 0001      Neuro Re-ed    Neuro Re-ed Details   biofeedback rest - 2 mV; 3xquick max27.68m ave 851m 10 sec hold ave 7 mV; 20 sec hold 6.54m55mrest 2.2mV42m   Lumbar Exercises: Stretches   Single Knee to Chest Stretch  Right;Left;5 reps;10 seconds      Lumbar Exercises: Supine   Ab Set  10 reps;5 seconds    Heel Slides  10 reps    Bridge  10 reps;5 seconds with pelvic contraction increased pain on left      Modalities   Modalities  Electrical Stimulation;Moist Heat      Moist Heat Therapy   Number Minutes Moist Heat  12 Minutes    Moist Heat Location  Other (comment) abdomen      Electrical Stimulation   Electrical Stimulation Location  abdomen    Electrical Stimulation Action  IFC    Electrical Stimulation Parameters  to tolerance 15m; x 12 min    Electrical Stimulation Goals  Pain               PT Short Term Goals - 03/08/18 0901      PT SHORT TERM GOAL #2   Title  pt will be ind with initial HEP    Time  4    Period  Weeks    Status  Achieved        PT Long Term Goals - 03/19/18 1108      PT LONG TERM GOAL #1   Title  Pt will be ind with advanced HEP    Status  On-going      PT LONG TERM GOAL #2   Title  pt will demonstrate full pain free hip flexion biltarally in order to be able to perform sitting tasks    Baseline  full ROM not always pain free    Status  Partially Met      PT LONG TERM GOAL #3   Title  pt will report 60% less pain during typical work day    Status  On-going      PT LONG TERM GOAL #4   Title  Pt will be able to perform kegel of at least 2/5 MMT and hold >8 seconds in order to demonstrate strength and endurace for activities.    Status  On-going            Plan - 03/19/18 1105    Clinical Impression Statement  Pt demonstrates strong contraction up to 258m but needs a lot of co-contracting of glutes and was  observed bearing down.  Pt also had difficulty with contracting and holding more than 3 seconds.  Pt able to get a solid contraction without bearing down when cued to gently engage TrA.  Pt will benefit from skilled PT to progress core and pelvic floor strength and muscle coordination.    PT Treatment/Interventions  Biofeedback;ADLs/Self Care Home Management;Electrical Stimulation;Moist Heat;Cryotherapy;Therapeutic activities;Therapeutic exercise;Neuromuscular re-education;Patient/family education;Manual techniques;Dry needling;Taping;Passive range of motion    PT Next Visit Plan  start with biofeedback work on contracting without bearing down in variety of positions, pain management as needed    Consulted and Agree with Plan of Care  Patient       Patient will benefit from skilled therapeutic intervention in order to improve the following deficits and impairments:  Pain, Increased fascial restricitons, Postural dysfunction, Decreased strength, Decreased range of motion, Increased muscle spasms  Visit Diagnosis: Other muscle spasm  Muscle weakness (generalized)  Unspecified lack of coordination     Problem List Patient Active Problem List   Diagnosis Date Noted  . Adenomyosis 01/19/2018  . Abnormal uterine bleeding (AUB) 01/19/2018  . Abnormal uterine bleeding 01/19/2018  . Spider veins of both lower extremities 11/02/2017  . Optic neuritis 10/12/2017  . Other fatigue 10/12/2017  . Numbness 10/12/2017  . Gait disturbance 10/12/2017    JaZannie CovePT 03/19/2018, 11:09 AM  [facility] [address]one: [phone] Fax:  [phone]: [name] MCCORVEYRN: [id number] of Birth: 11/1970/10/01PHYSICAL THERAPY DISCHARGE SUMMARY  Visits from Start of Care: 5  Current functional level related to goals / functional outcomes: See above goals   Remaining deficits: See above    Education / Equipment: HEP Plan: Patient agrees to discharge.  Patient goals were partially met. Patient is being discharged due to not  returning since the last visit.  ?????    [employer], PT 06/02/18 8:30 AM

## 2018-03-25 ENCOUNTER — Inpatient Hospital Stay: Payer: 59

## 2018-03-25 ENCOUNTER — Inpatient Hospital Stay: Payer: 59 | Attending: Gynecologic Oncology | Admitting: Gynecologic Oncology

## 2018-03-25 ENCOUNTER — Encounter: Payer: Self-pay | Admitting: Gynecologic Oncology

## 2018-03-25 VITALS — BP 136/93 | HR 71 | Temp 98.0°F | Resp 20 | Ht 65.0 in | Wt 140.1 lb

## 2018-03-25 DIAGNOSIS — R102 Pelvic and perineal pain: Secondary | ICD-10-CM

## 2018-03-25 DIAGNOSIS — Z90722 Acquired absence of ovaries, bilateral: Secondary | ICD-10-CM | POA: Diagnosis not present

## 2018-03-25 DIAGNOSIS — Z90710 Acquired absence of both cervix and uterus: Secondary | ICD-10-CM | POA: Diagnosis not present

## 2018-03-25 DIAGNOSIS — N898 Other specified noninflammatory disorders of vagina: Secondary | ICD-10-CM

## 2018-03-25 DIAGNOSIS — R1084 Generalized abdominal pain: Secondary | ICD-10-CM

## 2018-03-25 LAB — CBC WITH DIFFERENTIAL (CANCER CENTER ONLY)
BASOS ABS: 0 10*3/uL (ref 0.0–0.1)
Basophils Relative: 0 %
Eosinophils Absolute: 0.1 10*3/uL (ref 0.0–0.5)
Eosinophils Relative: 2 %
HEMATOCRIT: 43.4 % (ref 34.8–46.6)
Hemoglobin: 14.7 g/dL (ref 11.6–15.9)
LYMPHS PCT: 35 %
Lymphs Abs: 2.4 10*3/uL (ref 0.9–3.3)
MCH: 30.1 pg (ref 25.1–34.0)
MCHC: 33.9 g/dL (ref 31.5–36.0)
MCV: 88.9 fL (ref 79.5–101.0)
Monocytes Absolute: 0.9 10*3/uL (ref 0.1–0.9)
Monocytes Relative: 13 %
NEUTROS ABS: 3.5 10*3/uL (ref 1.5–6.5)
NEUTROS PCT: 50 %
Platelet Count: 317 10*3/uL (ref 145–400)
RBC: 4.88 MIL/uL (ref 3.70–5.45)
RDW: 13.1 % (ref 11.2–14.5)
WBC: 6.9 10*3/uL (ref 3.9–10.3)

## 2018-03-25 LAB — BASIC METABOLIC PANEL
ANION GAP: 9 (ref 5–15)
BUN: 17 mg/dL (ref 6–20)
CO2: 29 mmol/L (ref 22–32)
Calcium: 11.1 mg/dL — ABNORMAL HIGH (ref 8.9–10.3)
Chloride: 104 mmol/L (ref 98–111)
Creatinine, Ser: 0.77 mg/dL (ref 0.44–1.00)
GFR calc Af Amer: >60 mL/min (ref >60)
GFR calc non Af Amer: >60 mL/min (ref >60)
GLUCOSE: 95 mg/dL (ref 70–99)
POTASSIUM: 3.8 mmol/L (ref 3.5–5.1)
Sodium: 142 mmol/L (ref 135–145)

## 2018-03-25 MED ORDER — METRONIDAZOLE 500 MG PO TABS: 500.0000 mg | ORAL_TABLET | Freq: Three times a day (TID) | ORAL | 1 refills | Status: DC

## 2018-03-25 MED ORDER — CIPROFLOXACIN HCL 250 MG PO TABS: 250.0000 mg | ORAL_TABLET | Freq: Two times a day (BID) | ORAL | 1 refills | Status: DC

## 2018-03-25 MED FILL — metroNIDAZOLE 500 MG TABS: 500 | 7 days supply | Qty: 21 | Fill #0

## 2018-03-25 MED FILL — CIPROFLOXACIN HCL 250 MG TA: 250 | 7 days supply | Qty: 14 | Fill #0

## 2018-03-25 NOTE — Patient Instructions (Signed)
Please take the ciprofloxacin twice a day for 1 week. Please take the flagyl 3 times a day for 1 week.  Dr Denman George has scheduled a CT scan of the abdomen and pelvis to evaluate for pelvic collections.  Please contact her at [phone] or at her cell phone [phone]) if you have any questions or concerns.

## 2018-03-25 NOTE — Progress Notes (Signed)
Follow-up Note: Gyn-Onc   [name] [name] 47 y.o. female  CC:  Chief Complaint  Patient presents with  . Pelvic Pain    s/p hysterectomy    Assessment/Plan:  [name]  is a 47 y.o.  year old with history of abnormal uterine bleeding in the setting of unopposed estrogen. S/p robotic hysterectomy, bilateral salpingectomy with left ovarian cystectomy on 01/19/18. Benign pathology.  Persistent left pelvic pain/heaviness.  Unclear etiology. Suspect cuff cellulitis. Will start empiric therapy with cipro and flagyl.  Check CBC for assessment of infection/anemia. Check bmet prior to scan to assess renal function.  Recommend CT imaging to evaluate for drainable collection.   HPI: [name] [name] is a 47 year old P2 who is seen for abnormal uterine bleeding and thickened endometrium on [place].  The patient has a history of irregular menses since June, 2018. It spaced for several weeks and then became extremely heavy vaginal bleeding in the first week of April, 2019 passing clots.  TVUS was performed on 12/11/17 which showed a uterus measuring 7.8x5x5.7cm with retroversion and no fibroids. Mildly heterogeneous myometrial echotexture consistent with adenomyosis. 2+cm anechoic cyst on left ovary.   She was started on estrogen/progesterone HRT for symptoms of menopause at approximately age 56. She developed rash to the progesterone. She tried introducing it back, however always developed the rash. Instead she continued with single agent estrogen which did control her symptoms of menopause.  She has a history of MS since age 30. 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 Stress, decreased sleep and febrile morbidity triggers flares.  She is otherwise very health.  Her surgical history is significant for an uncomplicated laparoscopic cholecystectomy in 2008. She has a history of a cesarean section and a bunionectomy.   Her family  history is significant for a mother who developed uterine cancer in 1992 and was treated surgically without needing adjuvant therapy. She has no family history of colon, breast or ovarian cancers.  She has had one vaginal delivery and one cesarean section.    On 01/19/18 she underwent robotic assisted total hysterectomy, bilateral salpingectomy. Intraoperative findings were significant for a 7cm grossly normal appearing uterus, surgically interrupted fallopian tubes, left ovarian unilocular simple cyst (approximately 3cm), normal appendix. Surgery was uncomplicated. Postoperatively she had significant pain (likely from foley) and nausea. This improved overnight. Her labs remained stable.  Pathology revealed a benign left ovarian simple cyst and benign uterus (endometrium, myometrium and tubes).  Interval Hx:  Since surgery she has had persistent pelvic pressure. There is more pressure on the left than the right. It comes in cycles, with cramping 5/10 pain, and then some relief with passage of yellow thick discharge (no blood). Denies fevers, but is mildly tachycardic.  Current Meds:  Outpatient Encounter Medications as of 03/25/2018  Medication Sig  . Amantadine HCl 100 MG tablet Take 100 mg by mouth 2 (two) times daily as needed (for multiple sclerosis flares).   . cetirizine (ZYRTEC) 10 MG tablet Take 10 mg by mouth at bedtime.   . cyclobenzaprine (FLEXERIL) 10 MG tablet Take 10 mg by mouth 2 (two) times daily as needed (for multiple sclerosis flares).   Marland Kitchen estradiol (MINIVELLE) 0.1 MG/24HR patch Place 1 patch (0.1 mg total) onto the skin 2 (two) times a week.  . hydrOXYzine (ATARAX/VISTARIL) 25 MG tablet Take 25 mg by mouth daily as needed (for itching).   Marland Kitchen ibuprofen (ADVIL,MOTRIN) 600 MG  tablet Take 600 mg by mouth daily as needed (for pain.).  Marland Kitchen methylphenidate (RITALIN) 10 MG tablet Take 10 mg by mouth 2 (two) times daily as needed (for multiple sclerosis fatigue).   Marland Kitchen senna-docusate  (SENOKOT-S) 8.6-50 MG tablet Take 2 tablets by mouth at bedtime.  . [DISCONTINUED] acetaminophen (TYLENOL) 500 MG tablet Take 2 tablets (1,000 mg total) by mouth every 6 (six) hours. (Patient not taking: Reported on 03/25/2018)   No facility-administered encounter medications on file as of 03/25/2018.     Allergy:  Allergies  Allergen Reactions  . Venlafaxine Hives    blisters  . Augmentin [Amoxicillin-Pot Clavulanate] Rash    tongue swelling Has patient had a PCN reaction causing immediate rash, facial/tongue/throat swelling, SOB or lightheadedness with hypotension: No Has patient had a PCN reaction causing severe rash involving mucus membranes or skin necrosis: No Has patient had a PCN reaction that required hospitalization: No Has patient had a PCN reaction occurring within the last 10 years: No If all of the above answers are "NO", then may proceed with Cephalosporin use.   . Demerol [Meperidine] Swelling and Rash    Tongue swelling  . Estrogens Rash    The patient can use patch,No oral  . Gabapentin Rash  . Stadol [Butorphanol] Swelling and Rash    Tongue swelling    Social Hx:   Social History   Socioeconomic History  . Marital status: Divorced    Spouse name: Not on file  . Number of children: 2  . Years of education: Not on file  . Highest education level: Not on file  Occupational History  . Not on file  Social Needs  . Financial resource strain: Not on file  . Food insecurity:    Worry: Not on file    Inability: Not on file  . Transportation needs:    Medical: Not on file    Non-medical: Not on file  Tobacco Use  . Smoking status: Never Smoker  . Smokeless tobacco: Never Used  Substance and Sexual Activity  . Alcohol use: Yes    Alcohol/week: 0.0 oz    Comment: rarely  . Drug use: No  . Sexual activity: Yes  Lifestyle  . Physical activity:    Days per week: Not on file    Minutes per session: Not on file  . Stress: Not on file  Relationships  .  Social connections:    Talks on phone: Not on file    Gets together: Not on file    Attends religious service: Not on file    Active member of club or organization: Not on file    Attends meetings of clubs or organizations: Not on file    Relationship status: Not on file  . Intimate partner violence:    Fear of current or ex partner: Not on file    Emotionally abused: Not on file    Physically abused: Not on file    Forced sexual activity: Not on file  Other Topics Concern  . Not on file  Social History Narrative  . Not on file    Past Surgical Hx:  Past Surgical History:  Procedure Laterality Date  . adnoidectomy    . BUNIONECTOMY Left    foot  . CESAREAN SECTION    . CHOLECYSTECTOMY    . ROBOTIC ASSISTED LAPAROSCOPIC HYSTERECTOMY AND SALPINGECTOMY N/A 01/19/2018   Procedure: XI ROBOTIC ASSISTED LAPAROSCOPIC HYSTERECTOMY AND BILATERAL SALPINGECTOMY, LEFT OVARIAN CYSTECTOMY;  Surgeon: Everitt Amber, MD;  Location:  [facility];  Service: Gynecology;  Laterality: N/A;  . wisdom teeth exraction      Past Medical Hx:  Past Medical History:  Diagnosis Date  . Allergy   . Anxiety   . Cancer ([place])    basal cell skin ca  . Depression   . Family history of adverse reaction to anesthesia    sister woke up during surgery age 34's for tonsillectomy  . Multiple sclerosis ([place])   . Neuromuscular disorder ([place])    multiple sclerosis  . Vision abnormalities     Past Gynecological History:  C/s x 1 and vaginal delivery x 1 Patient's last menstrual period was 07/13/2015 (exact date).  Family Hx:  Family History  Problem Relation Age of Onset  . Cervical cancer Mother   . Dementia Mother   . Melanoma Father   . Ulcerative colitis Sister   . Colon cancer Neg Hx   . Esophageal cancer Neg Hx   . Rectal cancer Neg Hx   . Stomach cancer Neg Hx     Review of Systems:  Constitutional  Feels well,    ENT Normal appearing ears and nares bilaterally Skin/Breast  No rash, sores,  jaundice, itching, dryness Cardiovascular  No chest pain, shortness of breath, or edema  Pulmonary  No cough or wheeze.  Gastro Intestinal  No nausea, vomitting, or diarrhoea. No bright red blood per rectum, no abdominal pain, change in bowel movement, or constipation.  Genito Urinary  No frequency, urgency, dysuria,       Musculo Skeletal  No myalgia, arthralgia, joint swelling or pain  Neurologic  No weakness, numbness, change in gait,  Psychology  No depression, anxiety, insomnia.   Vitals:  Blood pressure (!) 136/93, pulse 71, temperature 98 F (36.7 C), temperature source Oral, resp. rate 20, height 5\' 5"  (1.651 m), weight 140 lb 1.6 oz (63.5 kg), last menstrual period 07/13/2015, SpO2 100 %.  Physical Exam: WD in NAD Neck  Supple NROM, without any enlargements.  Lymph Node Survey deferred Cardiovascular  deferred Lungs  deferred Skin  No rash/lesions/breakdown  Psychiatry  Alert and oriented to person, place, and time  Abdomen  Normoactive bowel sounds, abdomen soft and thin without evidence of hernia. Incisions are well healed. There is tenderness in the lower abdomen to deep palpation. Back No CVA tenderness Genito Urinary  Vaginal cuff in tact and without blood or drainage. There is a 64mm area of granulation tissue at the left aspect of the cuff, but no drainage or sinus associated with this. On bimanual exam there is a soft vagina with no palpable masses or collections. However there is tenderness to palpate the vaginal cuff.   Rectal  deferred Extremities  No bilateral cyanosis, clubbing or edema.    Thereasa Solo, MD  03/25/2018, 2:45 PM

## 2018-03-26 ENCOUNTER — Ambulatory Visit (HOSPITAL_COMMUNITY)
Admission: RE | Admit: 2018-03-26 | Discharge: 2018-03-26 | Disposition: A | Discharge status: Home / Self Care | Payer: 59 | Source: Ambulatory Visit | Attending: Gynecologic Oncology | Admitting: Gynecologic Oncology

## 2018-03-26 DIAGNOSIS — Z90710 Acquired absence of both cervix and uterus: Secondary | ICD-10-CM | POA: Diagnosis not present

## 2018-03-26 DIAGNOSIS — R1084 Generalized abdominal pain: Secondary | ICD-10-CM | POA: Diagnosis not present

## 2018-03-26 DIAGNOSIS — R1032 Left lower quadrant pain: Secondary | ICD-10-CM | POA: Diagnosis not present

## 2018-03-26 MED ORDER — IOPAMIDOL (ISOVUE-300) INJECTION 61%
INTRAVENOUS | Status: AC
  Filled 2018-03-26: qty 100

## 2018-03-26 MED ORDER — IOPAMIDOL (ISOVUE-300) INJECTION 61%
100.0000 mL | Freq: Once | INTRAVENOUS | Status: AC | PRN
  Administered 2018-03-26: 100 mL via INTRAVENOUS

## 2018-03-31 ENCOUNTER — Encounter: Payer: Self-pay | Admitting: Gynecologic Oncology

## 2018-04-12 ENCOUNTER — Encounter: Payer: Self-pay | Admitting: Neurology

## 2018-04-12 ENCOUNTER — Other Ambulatory Visit: Payer: Self-pay

## 2018-04-12 ENCOUNTER — Ambulatory Visit: Payer: 59 | Admitting: Neurology

## 2018-04-12 VITALS — BP 114/78 | HR 68 | Resp 14 | Ht 65.0 in | Wt 137.0 lb

## 2018-04-12 DIAGNOSIS — R269 Unspecified abnormalities of gait and mobility: Secondary | ICD-10-CM

## 2018-04-12 DIAGNOSIS — H469 Unspecified optic neuritis: Secondary | ICD-10-CM

## 2018-04-12 DIAGNOSIS — R5383 Other fatigue: Secondary | ICD-10-CM

## 2018-04-12 MED ORDER — METHYLPHENIDATE HCL 10 MG PO TABS: 10.0000 mg | ORAL_TABLET | Freq: Two times a day (BID) | ORAL | 0 refills | Status: DC | PRN

## 2018-04-12 MED FILL — METHYLPHENIDATE 10 MG TAB: 10 | 30 days supply | Qty: 60 | Fill #0

## 2018-04-12 NOTE — Progress Notes (Signed)
[facility]  PATIENT: [name] [name] DOB: 02-09-1971  REFERRING DOCTOR OR PCP:  Dr. Inda Merlin SOURCE: Notes from PCP and Dr. Nigel Bridgeman, imaging lab reports, MRI images on PACS.  _________________________________   HISTORICAL  CHIEF COMPLAINT:  Chief Complaint  Patient presents with  . Hx. of Optic Neuritis    HISTORY OF PRESENT ILLNESS:  [name] [name] is a 47 y.o. woman with a history of optic neuritis and who has undergone evaluation for possible multiple sclerosis.  Update 04/12/2018: She had optic neuritis in 2009 and has had multiple imaging studies of the brain and spine since that time.   They do not show evidence of demyelination.  Additionally, lumbar puncture in the past did not show oligoclonal bands and a vasculitis work-up was negative.   I personally reviewed images of the MRIs at the last visit.  At the last visit, I discussed with her that given the duration of time since her optic neuritis and multiple subsequent imaging studies, it is unlikely that she will develop MS.  Due to her fatigue, at the last visit we did check Epstein-Barr antibodies but the pattern is most consistent with her having had the virus at some time in the past but not having a chronic active form.   The anti-neuromyelitis optica antibodies were negative.    Since the last visit, she has had 2 episodes.   She noted some fatigue, eye pain and clumsiness in the left leg affecting gait. The second episode was similar except both legs seem affected.   She took Ritalin and amantadine and felt much better.   The episodes lasted about 2 weeks  Fatigue in general is fine but she is sensitive to the heat.     From 10/12/2017: In 2009, she lost vision in her left eye.   She had a black central dot and blurriness around that.    She felt symptoms resolved within an hour but was back the next day.    She had a similar recurrence the next day.    She saw ophthalmology (Dr. Zadie Rhine) and was diagnosed  with optic neuritis.    MRI's of the brain and cervical spine were normal.     She had numbness in her face shortly later.    She was referred to Dr. Hart Robinsons and then another doctor at [facility].    MS was not confirmed.   She saw Dr. Jacqulynn Cadet who felt she probably had MS but did not start a DMT.    She then had an episode of left leg numbness and left foot drop that comes and goes for a day or two and still does a couple times a year.    She also has a lot of fatigue (physical > cognitive).   When this occurs, she is more likely to have an episode involving the left leg.   She has had a couple more episodes of blurry vision but none the past year.   She has had steroids (starting with 200 mg and tapering down over a week or so) about twice a year.   The last time was summer 2018.  Some episodes are associated with her gait worsening and even her speech being off.     She always feels better for a few months afterwards.     She is on Ritalin 10 mg po bid and that usually helps her fatigue.  She sleeps well most every night.   She denies any major problems with  mood.    Right now, she denies any issues with her gait, strength, sensation or balance. Patient is doing well. She continues to have fatigue.      She was initially evaluated by Dr. Erling Cruz and had MRI studies, evoked potential studies CSF analysis (no oligoclonal bands and IgG index was normal) and blood work including NMO, anti-cardiolipin antibody, ANA, Ace, RPR, ESR, B12, Lyme. The blood work was normal or negative.  I personally reviewed the MRIs of the brain dated 05/10/2017 month 08/14/2014 and 05/10/2008 an MRI of the cervical spine from 12/16/2016 and MRI the thoracic spine from 08/16/2008. There were no demyelinating lesions in any of the MRIs  REVIEW OF SYSTEMS: Constitutional: No fevers, chills, sweats, or change in appetite.   Notes fatigue Eyes: As above Ear, nose and throat: No hearing loss, ear pain, nasal congestion, sore  throat Cardiovascular: No chest pain, palpitations Respiratory: No shortness of breath at rest or with exertion.   No wheezes GastrointestinaI: No nausea, vomiting, diarrhea, abdominal pain, fecal incontinence Genitourinary: No dysuria, urinary retention or frequency.  No nocturia. Musculoskeletal: No neck pain, back pain Integumentary: No rash, pruritus, skin lesions Neurological: as above Psychiatric: No depression at this time.  No anxiety Endocrine: No palpitations, diaphoresis, change in appetite, change in weigh or increased thirst Hematologic/Lymphatic: No anemia, purpura, petechiae. Allergic/Immunologic: No itchy/runny eyes, nasal congestion, recent allergic reactions, rashes  ALLERGIES: Allergies  Allergen Reactions  . Venlafaxine Hives    blisters  . Augmentin [Amoxicillin-Pot Clavulanate] Rash    tongue swelling Has patient had a PCN reaction causing immediate rash, facial/tongue/throat swelling, SOB or lightheadedness with hypotension: No Has patient had a PCN reaction causing severe rash involving mucus membranes or skin necrosis: No Has patient had a PCN reaction that required hospitalization: No Has patient had a PCN reaction occurring within the last 10 years: No If all of the above answers are "NO", then may proceed with Cephalosporin use.   . Demerol [Meperidine] Swelling and Rash    Tongue swelling  . Estrogens Rash    The patient can use patch,No oral  . Gabapentin Rash  . Stadol [Butorphanol] Swelling and Rash    Tongue swelling    HOME MEDICATIONS:  Current Outpatient Medications:  .  Amantadine HCl 100 MG tablet, Take 100 mg by mouth 2 (two) times daily as needed (for multiple sclerosis flares). , Disp: , Rfl: 3 .  cetirizine (ZYRTEC) 10 MG tablet, Take 10 mg by mouth at bedtime. , Disp: , Rfl:  .  cyclobenzaprine (FLEXERIL) 10 MG tablet, Take 10 mg by mouth 2 (two) times daily as needed (for multiple sclerosis flares). , Disp: , Rfl: 0 .  estradiol  (MINIVELLE) 0.1 MG/24HR patch, Place 1 patch (0.1 mg total) onto the skin 2 (two) times a week., Disp: 8 patch, Rfl: 12 .  hydrOXYzine (ATARAX/VISTARIL) 25 MG tablet, Take 25 mg by mouth daily as needed (for itching). , Disp: , Rfl:  .  methylphenidate (RITALIN) 10 MG tablet, Take 1 tablet (10 mg total) by mouth 2 (two) times daily as needed (for multiple sclerosis fatigue)., Disp: 60 tablet, Rfl: 0  PAST MEDICAL HISTORY: Past Medical History:  Diagnosis Date  . Allergy   . Anxiety   . Cancer ([place])    basal cell skin ca  . Depression   . Family history of adverse reaction to anesthesia    sister woke up during surgery age 85's for tonsillectomy  . Multiple sclerosis ([place])   .  Neuromuscular disorder ([place])    multiple sclerosis  . Vision abnormalities     PAST SURGICAL HISTORY: Past Surgical History:  Procedure Laterality Date  . adnoidectomy    . BUNIONECTOMY Left    foot  . CESAREAN SECTION    . CHOLECYSTECTOMY    . ROBOTIC ASSISTED LAPAROSCOPIC HYSTERECTOMY AND SALPINGECTOMY N/A 01/19/2018   Procedure: XI ROBOTIC ASSISTED LAPAROSCOPIC HYSTERECTOMY AND BILATERAL SALPINGECTOMY, LEFT OVARIAN CYSTECTOMY;  Surgeon: Everitt Amber, MD;  Location: [facility];  Service: Gynecology;  Laterality: N/A;  . wisdom teeth exraction      FAMILY HISTORY: Family History  Problem Relation Age of Onset  . Cervical cancer Mother   . Dementia Mother   . Melanoma Father   . Ulcerative colitis Sister   . Colon cancer Neg Hx   . Esophageal cancer Neg Hx   . Rectal cancer Neg Hx   . Stomach cancer Neg Hx     SOCIAL HISTORY:  Social History   Socioeconomic History  . Marital status: Divorced    Spouse name: Not on file  . Number of children: 2  . Years of education: Not on file  . Highest education level: Not on file  Occupational History  . Not on file  Social Needs  . Financial resource strain: Not on file  . Food insecurity:    Worry: Not on file    Inability: Not on file  .  Transportation needs:    Medical: Not on file    Non-medical: Not on file  Tobacco Use  . Smoking status: Never Smoker  . Smokeless tobacco: Never Used  Substance and Sexual Activity  . Alcohol use: Yes    Alcohol/week: 0.0 oz    Comment: rarely  . Drug use: No  . Sexual activity: Yes  Lifestyle  . Physical activity:    Days per week: Not on file    Minutes per session: Not on file  . Stress: Not on file  Relationships  . Social connections:    Talks on phone: Not on file    Gets together: Not on file    Attends religious service: Not on file    Active member of club or organization: Not on file    Attends meetings of clubs or organizations: Not on file    Relationship status: Not on file  . Intimate partner violence:    Fear of current or ex partner: Not on file    Emotionally abused: Not on file    Physically abused: Not on file    Forced sexual activity: Not on file  Other Topics Concern  . Not on file  Social History Narrative  . Not on file     PHYSICAL EXAM  Vitals:   04/12/18 1032  BP: 114/78  Pulse: 68  Resp: 14  Weight: 137 lb (62.1 kg)  Height: _0  (1.651 m)    Body mass index is 22.8 kg/m.   General: The patient is well-developed and well-nourished and in no acute distress  Neurologic Exam  Mental status: The patient is alert and oriented x 3 at the time of the examination. The patient has apparent normal recent and remote memory, with an apparently normal attention span and concentration ability.   Speech is normal.  Cranial nerves: Extraocular movements are full.    Facial strength and sensation is normal.  Trapezius strength is normal.  The tongue is midline, and the patient has symmetric elevation of the soft palate. No obvious hearing  deficits are noted.  Motor:  Muscle bulk is normal.   Tone is normal. Strength is  5 / 5 in all 4 extremities.   Sensory: Sensory testing is intact to touch and vibration in the arms and  legs  Coordination: Cerebellar testing reveals good finger-nose-finger and heel-to-shin bilaterally.  Gait and station: Station is normal.   Gait and tandem gait are normal. Romberg is borderline.   Reflexes: Deep tendon reflexes are symmetric and normal bilaterally.       DIAGNOSTIC DATA (LABS, IMAGING, TESTING) - I reviewed patient records, labs, notes, testing and imaging myself where available.  Lab Results  Component Value Date   WBC 6.9 03/25/2018   HGB 14.7 03/25/2018   HCT 43.4 03/25/2018   MCV 88.9 03/25/2018   PLT 317 03/25/2018      ASSESSMENT AND PLAN  Optic neuritis  Other fatigue  Gait disturbance   1.   We discussed MRis and labs.  Likelihood of MS is very low at this point (10 years since ON) 2.   Continue prn Ritalin and amantadine as needed for occasional episodes of severe fatigue +/- mild neurologic symptoms.  Call if more severe neurologic symptoms 3.   rtc 1 year or call sooner if new or worsening symptoms    Richard A. Felecia Shelling, MD, [facility] 11/07/7407, 9:27 PM Certified in Neurology, Clinical Neurophysiology, Sleep Medicine, Pain Medicine and Neuroimaging  [facility] [address], [place], [place] [address] [phone]

## 2018-04-22 MED FILL — DOXEPIN 25 MG CAPSULE: 25 | 30 days supply | Qty: 30 | Fill #1

## 2018-04-22 MED FILL — ESTRADIOL 0.1 MG/24HR PTTW: 0.1 | 28 days supply | Qty: 8 | Fill #2

## 2018-04-22 MED FILL — hydrOXYzine HCL 25 MG TABS: 25 | 30 days supply | Qty: 30 | Fill #1

## 2018-05-11 MED FILL — CIPROFLOXACIN HCL 250 MG TA: 250 | 7 days supply | Qty: 14 | Fill #1

## 2018-05-20 ENCOUNTER — Other Ambulatory Visit: Payer: Self-pay

## 2018-05-20 DIAGNOSIS — H538 Other visual disturbances: Secondary | ICD-10-CM | POA: Diagnosis not present

## 2018-05-20 DIAGNOSIS — Z7689 Persons encountering health services in other specified circumstances: Secondary | ICD-10-CM | POA: Diagnosis not present

## 2018-05-24 ENCOUNTER — Telehealth: Payer: Self-pay | Admitting: Neurology

## 2018-05-24 LAB — C-REACTIVE PROTEIN: CRP: 2 mg/L (ref 0–10)

## 2018-05-24 NOTE — Telephone Encounter (Signed)
Pt called stating she is having symptoms for the past few days such as joint pain, optic pain in left eye only, also having bathroom issues. Requesting a call to advise

## 2018-05-24 NOTE — Telephone Encounter (Signed)
Spoke with [name] [name].  She sts. she has been having mult. joint pain, esp. in hands when gripping the steering wheel while driving. Also a little left eye pain. Most concerning was one  episode of fecal incontinence last night. Denies back pain. Has seen pcp and has another appt. at 3:45pm tomorrow. Sts. labs done to r/o Lupus, RA. other conditions. Appt. for 9/23 given (9am). She will call us back with an update after seeing pcp if he feels she needs to be seen sooner than 05/31/18./fim

## 2018-05-25 DIAGNOSIS — H469 Unspecified optic neuritis: Secondary | ICD-10-CM | POA: Diagnosis not present

## 2018-05-25 DIAGNOSIS — H538 Other visual disturbances: Secondary | ICD-10-CM | POA: Diagnosis not present

## 2018-05-25 DIAGNOSIS — G35 Multiple sclerosis: Secondary | ICD-10-CM | POA: Diagnosis not present

## 2018-05-25 DIAGNOSIS — M2550 Pain in unspecified joint: Secondary | ICD-10-CM | POA: Diagnosis not present

## 2018-05-26 DIAGNOSIS — H5315 Visual distortions of shape and size: Secondary | ICD-10-CM | POA: Diagnosis not present

## 2018-05-26 DIAGNOSIS — H5712 Ocular pain, left eye: Secondary | ICD-10-CM | POA: Diagnosis not present

## 2018-05-26 DIAGNOSIS — R51 Headache: Secondary | ICD-10-CM | POA: Diagnosis not present

## 2018-05-27 NOTE — Telephone Encounter (Signed)
LMOM to see how her pcp appt. went; if she needed to move her appt. with RAS up. If she does not, she does not need to return this call/fim

## 2018-05-28 LAB — AUTOIMMUNE PROFILE
Anti Nuclear Antibody(ANA): NEGATIVE
Complement C3, Serum: 144 mg/dL (ref 82–167)
dsDNA Ab: <1 IU/mL (ref 0–9)

## 2018-05-28 LAB — SPECIMEN STATUS REPORT

## 2018-05-28 LAB — RHEUMATOID FACTOR: Rhuematoid fact SerPl-aCnc: <10 IU/mL (ref 0.0–13.9)

## 2018-05-31 ENCOUNTER — Encounter: Payer: Self-pay | Admitting: Neurology

## 2018-05-31 ENCOUNTER — Ambulatory Visit: Payer: 59 | Admitting: Neurology

## 2018-05-31 ENCOUNTER — Telehealth: Payer: Self-pay | Admitting: Neurology

## 2018-05-31 ENCOUNTER — Other Ambulatory Visit: Payer: Self-pay

## 2018-05-31 VITALS — BP 129/86 | HR 79 | Resp 14 | Ht 65.0 in | Wt 137.5 lb

## 2018-05-31 DIAGNOSIS — R159 Full incontinence of feces: Secondary | ICD-10-CM

## 2018-05-31 DIAGNOSIS — R5383 Other fatigue: Secondary | ICD-10-CM | POA: Diagnosis not present

## 2018-05-31 DIAGNOSIS — R269 Unspecified abnormalities of gait and mobility: Secondary | ICD-10-CM

## 2018-05-31 DIAGNOSIS — R200 Anesthesia of skin: Secondary | ICD-10-CM | POA: Diagnosis not present

## 2018-05-31 DIAGNOSIS — H469 Unspecified optic neuritis: Secondary | ICD-10-CM

## 2018-05-31 NOTE — Progress Notes (Signed)
[facility]  PATIENT: [name] [name] DOB: 02/08/1971  REFERRING DOCTOR OR PCP:  Dr. Inda Merlin SOURCE: Notes from PCP and Dr. Nigel Bridgeman, imaging lab reports, MRI images on PACS.  _________________________________   HISTORICAL  CHIEF COMPLAINT:  Chief Complaint  Patient presents with  . Optic Neuritis    Had recurrence of left eye pain 1-2 wks. ago. Also had multiple joint pain and one single episode of fecal incontinence. Sts. was washing dishes, turned to walk from the sink and had sudden episode of fecal incontinence. Not associated with presyncope/syncope, pain, numbness, etc. Saw her pcp. No imaging studies, but extensive labwork was done, but she does not have these results. She saw opthalmology (Dr. Midge Aver) and sts. he believes left eye pain was due to resolving optic neuritis. She will f/u   . Encopresis    with him again in January/fim    HISTORY OF PRESENT ILLNESS:  [name] [name] is a 47 y.o. woman with a history of optic neuritis and who has undergone evaluation for possible multiple sclerosis.      Update 05/31/2018: She reports a new problem of left eye pain about 1 1/2 ago.  Pain was constant and did not fluctuate with eye movements,    She felt the pain improved with motrin mildly.  The pain is similar to pain that she experienced 10 years ago when she had optic neuritis.  She also felt there was slight changes in vision that was fluctuating.     She did not note change in color vision.  She does not note much change in vision but there has been some fluctuation in acuity.   She saw Dr. Katy Fitch and she reports he felt she was having resolving optic neuritis or another process.  She had an episode of fecal incontinence.  This has never happened in the past.  She was washing dishes and without any notice or urge had fecal incontinence.   There was no urge and no diarrhea.       She feels gait is okay.    She is more tired.   She denies any pain in the neck or  upper back or lower back.  She notes no stiffness in her spine.  She also feels that her gait is worse and she feels off balance.  This is also new.   She has experienced some difficulty with gait ataxia in the past that has been intermittent and evaluation has been negative in the past.  An MRI of the cervical spine in 2017 did not show any demyelinating plaques or significant DJD.  MRI of the thoracic spine was last performed in 2009 and was, by report, normal.     There has been concern of MS in the past due to her optic neuritis 10 years ago.  However, subsequent MRIs have been essentially normal with no evidence of demyelination and a lumbar puncture had shown normal CSF in the past.   Ritalin has helped her focus and attention but she still has some fatigue.     Update 04/12/2018: She had optic neuritis in 2009 and has had multiple imaging studies of the brain and spine since that time.   They do not show evidence of demyelination.  Additionally, lumbar puncture in the past did not show oligoclonal bands and a vasculitis work-up was negative.   I personally reviewed images of the MRIs at the last visit.  At the last visit, I discussed with her that given  the duration of time since her optic neuritis and multiple subsequent imaging studies, it is unlikely that she will develop MS.  Due to her fatigue, at the last visit we did check Epstein-Barr antibodies but the pattern is most consistent with her having had the virus at some time in the past but not having a chronic active form.   The anti-neuromyelitis optica antibodies were negative.    Since the last visit, she has had 2 episodes.   She noted some fatigue, eye pain and clumsiness in the left leg affecting gait. The second episode was similar except both legs seem affected.   She took Ritalin and amantadine and felt much better.   The episodes lasted about 2 weeks  Fatigue in general is fine but she is sensitive to the heat.     From  10/12/2017: In 2009, she lost vision in her left eye.   She had a black central dot and blurriness around that.    She felt symptoms resolved within an hour but was back the next day.    She had a similar recurrence the next day.    She saw ophthalmology (Dr. Zadie Rhine) and was diagnosed with optic neuritis.    MRI's of the brain and cervical spine were normal.     She had numbness in her face shortly later.    She was referred to Dr. Hart Robinsons and then another doctor at [facility].    MS was not confirmed.   She saw Dr. Jacqulynn Cadet who felt she probably had MS but did not start a DMT.    She then had an episode of left leg numbness and left foot drop that comes and goes for a day or two and still does a couple times a year.    She also has a lot of fatigue (physical > cognitive).   When this occurs, she is more likely to have an episode involving the left leg.   She has had a couple more episodes of blurry vision but none the past year.   She has had steroids (starting with 200 mg and tapering down over a week or so) about twice a year.   The last time was summer 2018.  Some episodes are associated with her gait worsening and even her speech being off.     She always feels better for a few months afterwards.     She is on Ritalin 10 mg po bid and that usually helps her fatigue.  She sleeps well most every night.   She denies any major problems with mood.    Right now, she denies any issues with her gait, strength, sensation or balance. Patient is doing well. She continues to have fatigue.      She was initially evaluated by Dr. Erling Cruz and had MRI studies, evoked potential studies CSF analysis (no oligoclonal bands and IgG index was normal) and blood work including NMO, anti-cardiolipin antibody, ANA, Ace, RPR, ESR, B12, Lyme. The blood work was normal or negative.  I personally reviewed the MRIs of the brain dated 05/10/2017 month 08/14/2014 and 05/10/2008 an MRI of the cervical spine from 12/16/2016 and MRI the  thoracic spine from 08/16/2008. There were no demyelinating lesions in any of the MRIs  REVIEW OF SYSTEMS: Constitutional: No fevers, chills, sweats, or change in appetite.   Notes fatigue Eyes: As above Ear, nose and throat: No hearing loss, ear pain, nasal congestion, sore throat Cardiovascular: No chest pain, palpitations Respiratory: No shortness of  breath at rest or with exertion.   No wheezes GastrointestinaI: No nausea, vomiting, diarrhea, abdominal pain.  She had one episode of fecal incontinence. Genitourinary: No dysuria, urinary retention or frequency.  No nocturia. Musculoskeletal: No neck pain, back pain Integumentary: No rash, pruritus, skin lesions Neurological: as above Psychiatric: No depression at this time.  No anxiety Endocrine: No palpitations, diaphoresis, change in appetite, change in weigh or increased thirst Hematologic/Lymphatic: No anemia, purpura, petechiae. Allergic/Immunologic: No itchy/runny eyes, nasal congestion, recent allergic reactions, rashes  ALLERGIES: Allergies  Allergen Reactions  . Venlafaxine Hives    blisters  . Augmentin [Amoxicillin-Pot Clavulanate] Rash    tongue swelling Has patient had a PCN reaction causing immediate rash, facial/tongue/throat swelling, SOB or lightheadedness with hypotension: No Has patient had a PCN reaction causing severe rash involving mucus membranes or skin necrosis: No Has patient had a PCN reaction that required hospitalization: No Has patient had a PCN reaction occurring within the last 10 years: No If all of the above answers are "NO", then may proceed with Cephalosporin use.   . Demerol [Meperidine] Swelling and Rash    Tongue swelling  . Estrogens Rash    The patient can use patch,No oral  . Gabapentin Rash  . Stadol [Butorphanol] Swelling and Rash    Tongue swelling    HOME MEDICATIONS:  Current Outpatient Medications:  .  Amantadine HCl 100 MG tablet, Take 100 mg by mouth 2 (two) times  daily as needed (for multiple sclerosis flares). , Disp: , Rfl: 3 .  cetirizine (ZYRTEC) 10 MG tablet, Take 10 mg by mouth at bedtime. , Disp: , Rfl:  .  cyclobenzaprine (FLEXERIL) 10 MG tablet, Take 10 mg by mouth 2 (two) times daily as needed (for multiple sclerosis flares). , Disp: , Rfl: 0 .  estradiol (MINIVELLE) 0.1 MG/24HR patch, Place 1 patch (0.1 mg total) onto the skin 2 (two) times a week., Disp: 8 patch, Rfl: 12 .  hydrOXYzine (ATARAX/VISTARIL) 25 MG tablet, Take 25 mg by mouth daily as needed (for itching). , Disp: , Rfl:  .  methylphenidate (RITALIN) 10 MG tablet, Take 1 tablet (10 mg total) by mouth 2 (two) times daily as needed (for multiple sclerosis fatigue)., Disp: 60 tablet, Rfl: 0  PAST MEDICAL HISTORY: Past Medical History:  Diagnosis Date  . Allergy   . Anxiety   . Cancer ([place])    basal cell skin ca  . Depression   . Family history of adverse reaction to anesthesia    sister woke up during surgery age 19's for tonsillectomy  . Multiple sclerosis ([place])   . Neuromuscular disorder ([place])    multiple sclerosis  . Vision abnormalities     PAST SURGICAL HISTORY: Past Surgical History:  Procedure Laterality Date  . adnoidectomy    . BUNIONECTOMY Left    foot  . CESAREAN SECTION    . CHOLECYSTECTOMY    . ROBOTIC ASSISTED LAPAROSCOPIC HYSTERECTOMY AND SALPINGECTOMY N/A 01/19/2018   Procedure: XI ROBOTIC ASSISTED LAPAROSCOPIC HYSTERECTOMY AND BILATERAL SALPINGECTOMY, LEFT OVARIAN CYSTECTOMY;  Surgeon: Everitt Amber, MD;  Location: [facility];  Service: Gynecology;  Laterality: N/A;  . wisdom teeth exraction      FAMILY HISTORY: Family History  Problem Relation Age of Onset  . Cervical cancer Mother   . Dementia Mother   . Melanoma Father   . Ulcerative colitis Sister   . Colon cancer Neg Hx   . Esophageal cancer Neg Hx   . Rectal cancer Neg Hx   .  Stomach cancer Neg Hx     SOCIAL HISTORY:  Social History   Socioeconomic History  . Marital status: Divorced     Spouse name: Not on file  . Number of children: 2  . Years of education: Not on file  . Highest education level: Not on file  Occupational History  . Not on file  Social Needs  . Financial resource strain: Not on file  . Food insecurity:    Worry: Not on file    Inability: Not on file  . Transportation needs:    Medical: Not on file    Non-medical: Not on file  Tobacco Use  . Smoking status: Never Smoker  . Smokeless tobacco: Never Used  Substance and Sexual Activity  . Alcohol use: Yes    Alcohol/week: 0.0 standard drinks    Comment: rarely  . Drug use: No  . Sexual activity: Yes  Lifestyle  . Physical activity:    Days per week: Not on file    Minutes per session: Not on file  . Stress: Not on file  Relationships  . Social connections:    Talks on phone: Not on file    Gets together: Not on file    Attends religious service: Not on file    Active member of club or organization: Not on file    Attends meetings of clubs or organizations: Not on file    Relationship status: Not on file  . Intimate partner violence:    Fear of current or ex partner: Not on file    Emotionally abused: Not on file    Physically abused: Not on file    Forced sexual activity: Not on file  Other Topics Concern  . Not on file  Social History Narrative  . Not on file     PHYSICAL EXAM  Vitals:   05/31/18 0829  BP: 129/86  Pulse: 79  Resp: 14  Weight: 137 lb 8 oz (62.4 kg)  Height: _0  (1.651 m)    Body mass index is 22.88 kg/m.   General: The patient is well-developed and well-nourished and in no acute distress  HEENT: Head is normocephalic and atraumatic.  The neck is nontender with a good range of motion.  The pharynx is nonerythematous.  Funduscopic examination shows normal optic disks and retinal vessels.  Extremities: There are no rashes and no edema.  Neurologic Exam  Mental status: The patient is alert and oriented x 3 at the time of the examination. The patient  has apparent normal recent and remote memory, with an apparently normal attention span and concentration ability.   Speech is normal.  Cranial nerves: Extraocular movements are full.    Facial strength is normal.   Sh reports reduced right sensation to temp relative to the left.    Trapezius strength is normal.  The tongue is midline, and the patient has symmetric elevation of the soft palate. No obvious hearing deficits are noted.  Motor:  Muscle bulk is normal.   Tone is normal. Strength is  5 / 5 in all 4 extremities.   Sensory: Sensory testing showed reduced temp and vibration sensation on the right arm relative to the left.   She has reduced touch sensation in her left leg relative to the right but intact vibration sensation in legs  Coordination: Cerebellar testing reveals good finger-nose-finger and slightly reduced left heel-to-shin .  Gait and station: Station is normal.   Gait is normal but tandem is wide.. Romberg  is borderline.   Reflexes: Deep tendon reflexes are symmetric and normal in arms and ankles with mild crossed adductor responses at knees. Marland Kitchen       DIAGNOSTIC DATA (LABS, IMAGING, TESTING) - I reviewed patient records, labs, notes, testing and imaging myself where available.  Lab Results  Component Value Date   WBC 6.9 03/25/2018   HGB 14.7 03/25/2018   HCT 43.4 03/25/2018   MCV 88.9 03/25/2018   PLT 317 03/25/2018      ASSESSMENT AND PLAN  Numbness - Plan: MR BRAIN W WO CONTRAST, MR CERVICAL SPINE W WO CONTRAST, MR THORACIC SPINE W WO CONTRAST  Gait disturbance - Plan: MR BRAIN W WO CONTRAST, MR CERVICAL SPINE W WO CONTRAST, MR THORACIC SPINE W WO CONTRAST  Other fatigue  Optic neuritis - Plan: MR BRAIN W WO CONTRAST  Incontinence of feces, unspecified fecal incontinence type - Plan: MR CERVICAL SPINE W WO CONTRAST, MR THORACIC SPINE W WO CONTRAST   1.  She had an episode of left eye pain with some visual disturbance.  Today, her vision is fairly  symmetric.  I am not certain if she had an episode of optic neuritis or not.  Additionally she has new onset numbness in the face and limbs on examination today.  Furthermore she has new onset of her gait being off balance and she had an episode of unexplained fecal incontinence.  We need to check an MRI of the brain, cervical spine and thoracic spine to rule out demyelination, ischemic change or myelopathy as possible sources of her symptoms.     If the MRI shows foci worrisome for MS, we will need to consider a disease modifying therapy.  If she has evidence of compressive myelopathy, consider referral to neurosurgery. 2.   Continue prn Ritalin and amantadine as needed for episodes of severe fatigue +/- mild neurologic symptoms.   3.   rtc 3 months or call sooner if new or worsening symptoms    Elyce Zollinger A. Felecia Shelling, MD, [facility] 6/57/8469, 62:95 AM Certified in Neurology, Clinical Neurophysiology, Sleep Medicine, Pain Medicine and Neuroimaging  [facility] [address] [place], [place] [address] [phone]

## 2018-05-31 NOTE — Telephone Encounter (Signed)
lvm for pt to call back about scheduling mri  [facility] Auth: [place] Ref # [id number]

## 2018-06-16 NOTE — Telephone Encounter (Signed)
Patient returned my call she is scheduled for 06/22/18 at [facility].

## 2018-06-18 DIAGNOSIS — Z85828 Personal history of other malignant neoplasm of skin: Secondary | ICD-10-CM | POA: Diagnosis not present

## 2018-06-18 DIAGNOSIS — L718 Other rosacea: Secondary | ICD-10-CM | POA: Diagnosis not present

## 2018-06-18 MED FILL — SOOLANTRA 1% CREAM: 1 | 30 days supply | Qty: 45 | Fill #0

## 2018-06-18 MED FILL — SULFAMETHOXAZOLE-TMP DS TAB: 800-160 | 30 days supply | Qty: 44 | Fill #0

## 2018-06-22 ENCOUNTER — Ambulatory Visit (INDEPENDENT_AMBULATORY_CARE_PROVIDER_SITE_OTHER): Payer: 59

## 2018-06-22 DIAGNOSIS — R200 Anesthesia of skin: Secondary | ICD-10-CM

## 2018-06-22 DIAGNOSIS — R159 Full incontinence of feces: Secondary | ICD-10-CM | POA: Diagnosis not present

## 2018-06-22 DIAGNOSIS — R269 Unspecified abnormalities of gait and mobility: Secondary | ICD-10-CM

## 2018-06-22 DIAGNOSIS — H469 Unspecified optic neuritis: Secondary | ICD-10-CM | POA: Diagnosis not present

## 2018-06-22 MED ORDER — GADOBENATE DIMEGLUMINE 529 MG/ML IV SOLN
13.0000 mL | Freq: Once | INTRAVENOUS | Status: AC | PRN
  Administered 2018-06-22: 13 mL via INTRAVENOUS

## 2018-06-24 ENCOUNTER — Telehealth: Payer: Self-pay | Admitting: *Deleted

## 2018-06-24 NOTE — Telephone Encounter (Signed)
-----   Message from Britt Bottom, MD sent at 06/23/2018  6:05 PM EDT ----- Please let her know that I looked at the MRIs of the brain and spine and compare them to her previous MRIs.  She does not have any evidence of MS in the brain or the spine.    She has some mild disc degenerative changes but nothing that causes nerve root compression.  These changes are also stable compared to her previous cervical and thoracic MRIs.

## 2018-06-24 NOTE — Telephone Encounter (Signed)
LMOM with below MRI report.  She does not need to return this call unless she has questions/fim 

## 2018-06-28 MED FILL — ESTRADIOL 0.1 MG/24HR PTTW: 0.1 | 28 days supply | Qty: 8 | Fill #3

## 2018-07-07 DIAGNOSIS — Z6823 Body mass index (BMI) 23.0-23.9, adult: Secondary | ICD-10-CM | POA: Diagnosis not present

## 2018-07-07 DIAGNOSIS — Z01419 Encounter for gynecological examination (general) (routine) without abnormal findings: Secondary | ICD-10-CM | POA: Diagnosis not present

## 2018-07-16 DIAGNOSIS — R300 Dysuria: Secondary | ICD-10-CM | POA: Diagnosis not present

## 2018-07-16 MED FILL — CIPROFLOXACIN HCL 500 MG TA: 500 | 7 days supply | Qty: 14 | Fill #0

## 2018-07-20 MED FILL — ESTRADIOL 0.1 MG/24HR PTTW: 0.1 | 84 days supply | Qty: 24 | Fill #0

## 2018-07-23 DIAGNOSIS — Z85828 Personal history of other malignant neoplasm of skin: Secondary | ICD-10-CM | POA: Diagnosis not present

## 2018-07-23 DIAGNOSIS — L718 Other rosacea: Secondary | ICD-10-CM | POA: Diagnosis not present

## 2018-07-23 MED FILL — SPIRONOLACTONE 50 MG TABS: 50 | 30 days supply | Qty: 30 | Fill #0

## 2018-08-10 ENCOUNTER — Ambulatory Visit: Payer: 59 | Admitting: Neurology

## 2018-08-11 MED FILL — CYCLOBENZAPRINE 10 MG TAB: 10 | 30 days supply | Qty: 30 | Fill #0

## 2018-08-11 MED FILL — DOXEPIN 25 MG CAPSULE: 25 | 30 days supply | Qty: 30 | Fill #0

## 2018-08-25 MED FILL — SPIRONOLACTONE 50 MG TABS: 50 | 30 days supply | Qty: 30 | Fill #1

## 2018-08-27 DIAGNOSIS — Z85828 Personal history of other malignant neoplasm of skin: Secondary | ICD-10-CM | POA: Diagnosis not present

## 2018-08-27 DIAGNOSIS — L718 Other rosacea: Secondary | ICD-10-CM | POA: Diagnosis not present

## 2018-08-27 DIAGNOSIS — D2272 Melanocytic nevi of left lower limb, including hip: Secondary | ICD-10-CM | POA: Diagnosis not present

## 2018-08-27 DIAGNOSIS — L821 Other seborrheic keratosis: Secondary | ICD-10-CM | POA: Diagnosis not present

## 2018-08-27 DIAGNOSIS — D2271 Melanocytic nevi of right lower limb, including hip: Secondary | ICD-10-CM | POA: Diagnosis not present

## 2018-08-27 DIAGNOSIS — D1801 Hemangioma of skin and subcutaneous tissue: Secondary | ICD-10-CM | POA: Diagnosis not present

## 2018-08-27 DIAGNOSIS — D2261 Melanocytic nevi of right upper limb, including shoulder: Secondary | ICD-10-CM | POA: Diagnosis not present

## 2018-09-24 ENCOUNTER — Encounter

## 2018-09-24 ENCOUNTER — Encounter: Payer: Self-pay | Admitting: Neurology

## 2018-09-24 ENCOUNTER — Ambulatory Visit (INDEPENDENT_AMBULATORY_CARE_PROVIDER_SITE_OTHER): Payer: 59 | Admitting: Neurology

## 2018-09-24 VITALS — BP 90/65 | HR 72 | Ht 65.0 in | Wt 144.0 lb

## 2018-09-24 DIAGNOSIS — H469 Unspecified optic neuritis: Secondary | ICD-10-CM

## 2018-09-24 DIAGNOSIS — R5383 Other fatigue: Secondary | ICD-10-CM | POA: Diagnosis not present

## 2018-09-24 DIAGNOSIS — R269 Unspecified abnormalities of gait and mobility: Secondary | ICD-10-CM

## 2018-09-24 NOTE — Progress Notes (Signed)
[facility]  PATIENT: [name] [name] DOB: June 07, 1971  REFERRING DOCTOR OR PCP:  Dr. Inda Merlin SOURCE: Notes from PCP and Dr. Nigel Bridgeman, imaging lab reports, MRI images on PACS.  _________________________________   HISTORICAL  CHIEF COMPLAINT:  Chief Complaint  Patient presents with  . Follow-up    RM 12, alone. Last seen 05/31/18. Pt reports she is not feeling well the last few days. Having left eye pain. Feels a little off balance while walking. Very tired also.    HISTORY OF PRESENT ILLNESS:  [name] [name] is a 48 y.o. woman with a history of optic neuritis and who has undergone evaluation for possible multiple sclerosis.      Update 07/26/19: She hah eye pain on the left but no visual changes or color saturation changes.   This  Is similar to pain in the past.      She is also noting more trouble with her balance the past week but was fine earlier.      She notes mild stiffness in her neck.     Focus and attention is good on Ritalin.     She does not take it everyday.       Update 05/31/2018: She reports a new problem of left eye pain about 1 1/2 ago.  Pain was constant and did not fluctuate with eye movements,    She felt the pain improved with motrin mildly.  The pain is similar to pain that she experienced 10 years ago when she had optic neuritis.  She also felt there was slight changes in vision that was fluctuating.     She did not note change in color vision.  She does not note much change in vision but there has been some fluctuation in acuity.   She saw Dr. Katy Fitch and she reports he felt she was having resolving optic neuritis or another process.  She had an episode of fecal incontinence.  This has never happened in the past.  She was washing dishes and without any notice or urge had fecal incontinence.   There was no urge and no diarrhea.       She feels gait is okay.    She is more tired.   She denies any pain in the neck or upper back or lower back.  She  notes no stiffness in her spine.  She also feels that her gait is worse and she feels off balance.  This is also new.   She has experienced some difficulty with gait ataxia in the past that has been intermittent and evaluation has been negative in the past.  An MRI of the cervical spine in 2017 did not show any demyelinating plaques or significant DJD.  MRI of the thoracic spine was last performed in 2009 and was, by report, normal.     There has been concern of MS in the past due to her optic neuritis 10 years ago.  However, subsequent MRIs have been essentially normal with no evidence of demyelination and a lumbar puncture had shown normal CSF in the past.   Ritalin has helped her focus and attention but she still has some fatigue.     Update 04/12/2018: She had optic neuritis in 2009 and has had multiple imaging studies of the brain and spine since that time.   They do not show evidence of demyelination.  Additionally, lumbar puncture in the past did not show oligoclonal bands and a vasculitis work-up was negative.   I  personally reviewed images of the MRIs at the last visit.  At the last visit, I discussed with her that given the duration of time since her optic neuritis and multiple subsequent imaging studies, it is unlikely that she will develop MS.  Due to her fatigue, at the last visit we did check Epstein-Barr antibodies but the pattern is most consistent with her having had the virus at some time in the past but not having a chronic active form.   The anti-neuromyelitis optica antibodies were negative.    Since the last visit, she has had 2 episodes.   She noted some fatigue, eye pain and clumsiness in the left leg affecting gait. The second episode was similar except both legs seem affected.   She took Ritalin and amantadine and felt much better.   The episodes lasted about 2 weeks  Fatigue in general is fine but she is sensitive to the heat.     From 10/12/2017: In 2009, she lost vision in  her left eye.   She had a black central dot and blurriness around that.    She felt symptoms resolved within an hour but was back the next day.    She had a similar recurrence the next day.    She saw ophthalmology (Dr. Zadie Rhine) and was diagnosed with optic neuritis.    MRI's of the brain and cervical spine were normal.     She had numbness in her face shortly later.    She was referred to Dr. Hart Robinsons and then another doctor at [facility]    MS was not confirmed.   She saw Dr. Jacqulynn Cadet who felt she probably had MS but did not start a DMT.    She then had an episode of left leg numbness and left foot drop that comes and goes for a day or two and still does a couple times a year.    She also has a lot of fatigue (physical > cognitive).   When this occurs, she is more likely to have an episode involving the left leg.   She has had a couple more episodes of blurry vision but none the past year.   She has had steroids (starting with 200 mg and tapering down over a week or so) about twice a year.   The last time was summer 2018.  Some episodes are associated with her gait worsening and even her speech being off.     She always feels better for a few months afterwards.     She is on Ritalin 10 mg po bid and that usually helps her fatigue.  She sleeps well most every night.   She denies any major problems with mood.    Right now, she denies any issues with her gait, strength, sensation or balance. Patient is doing well. She continues to have fatigue.      She was initially evaluated by Dr. Erling Cruz and had MRI studies, evoked potential studies CSF analysis (no oligoclonal bands and IgG index was normal) and blood work including NMO, anti-cardiolipin antibody, ANA, Ace, RPR, ESR, B12, Lyme. The blood work was normal or negative.  I personally reviewed the MRIs of the brain dated 05/10/2017 month 08/14/2014 and 05/10/2008 an MRI of the cervical spine from 12/16/2016 and MRI the thoracic spine from 08/16/2008. There were no  demyelinating lesions in any of the MRIs  REVIEW OF SYSTEMS: Constitutional: No fevers, chills, sweats, or change in appetite.   Notes fatigue Eyes: As above Ear,  nose and throat: No hearing loss, ear pain, nasal congestion, sore throat Cardiovascular: No chest pain, palpitations Respiratory: No shortness of breath at rest or with exertion.   No wheezes GastrointestinaI: No nausea, vomiting, diarrhea, abdominal pain.  She had one episode of fecal incontinence. Genitourinary: No dysuria, urinary retention or frequency.  No nocturia. Musculoskeletal: No neck pain, back pain Integumentary: No rash, pruritus, skin lesions Neurological: as above Psychiatric: No depression at this time.  No anxiety Endocrine: No palpitations, diaphoresis, change in appetite, change in weigh or increased thirst Hematologic/Lymphatic: No anemia, purpura, petechiae. Allergic/Immunologic: No itchy/runny eyes, nasal congestion, recent allergic reactions, rashes  ALLERGIES: Allergies  Allergen Reactions  . Venlafaxine Hives    blisters  . Augmentin [Amoxicillin-Pot Clavulanate] Rash    tongue swelling Has patient had a PCN reaction causing immediate rash, facial/tongue/throat swelling, SOB or lightheadedness with hypotension: No Has patient had a PCN reaction causing severe rash involving mucus membranes or skin necrosis: No Has patient had a PCN reaction that required hospitalization: No Has patient had a PCN reaction occurring within the last 10 years: No If all of the above answers are "NO", then may proceed with Cephalosporin use.   . Demerol [Meperidine] Swelling and Rash    Tongue swelling  . Estrogens Rash    The patient can use patch,No oral  . Gabapentin Rash  . Stadol [Butorphanol] Swelling and Rash    Tongue swelling    HOME MEDICATIONS:  Current Outpatient Medications:  .  Amantadine HCl 100 MG tablet, Take 100 mg by mouth 2 (two) times daily as needed (for multiple sclerosis flares). ,  Disp: , Rfl: 3 .  cetirizine (ZYRTEC) 10 MG tablet, Take 10 mg by mouth at bedtime. , Disp: , Rfl:  .  cyclobenzaprine (FLEXERIL) 10 MG tablet, Take 10 mg by mouth 2 (two) times daily as needed (for multiple sclerosis flares). , Disp: , Rfl: 0 .  estradiol (MINIVELLE) 0.1 MG/24HR patch, Place 1 patch (0.1 mg total) onto the skin 2 (two) times a week., Disp: 8 patch, Rfl: 12 .  hydrOXYzine (ATARAX/VISTARIL) 25 MG tablet, Take 25 mg by mouth daily as needed (for itching). , Disp: , Rfl:  .  methylphenidate (RITALIN) 10 MG tablet, Take 1 tablet (10 mg total) by mouth 2 (two) times daily as needed (for multiple sclerosis fatigue)., Disp: 60 tablet, Rfl: 0  PAST MEDICAL HISTORY: Past Medical History:  Diagnosis Date  . Allergy   . Anxiety   . Cancer ([place])    basal cell skin ca  . Depression   . Family history of adverse reaction to anesthesia    sister woke up during surgery age 46's for tonsillectomy  . Multiple sclerosis ([place])   . Neuromuscular disorder ([place])    multiple sclerosis  . Vision abnormalities     PAST SURGICAL HISTORY: Past Surgical History:  Procedure Laterality Date  . adnoidectomy    . BUNIONECTOMY Left    foot  . CESAREAN SECTION    . CHOLECYSTECTOMY    . ROBOTIC ASSISTED LAPAROSCOPIC HYSTERECTOMY AND SALPINGECTOMY N/A 01/19/2018   Procedure: XI ROBOTIC ASSISTED LAPAROSCOPIC HYSTERECTOMY AND BILATERAL SALPINGECTOMY, LEFT OVARIAN CYSTECTOMY;  Surgeon: Everitt Amber, MD;  Location: [facility];  Service: Gynecology;  Laterality: N/A;  . wisdom teeth exraction      FAMILY HISTORY: Family History  Problem Relation Age of Onset  . Cervical cancer Mother   . Dementia Mother   . Melanoma Father   . Ulcerative colitis Sister   .  Colon cancer Neg Hx   . Esophageal cancer Neg Hx   . Rectal cancer Neg Hx   . Stomach cancer Neg Hx     SOCIAL HISTORY:  Social History   Socioeconomic History  . Marital status: Divorced    Spouse name: Not on file  . Number of children:  2  . Years of education: Not on file  . Highest education level: Not on file  Occupational History  . Not on file  Social Needs  . Financial resource strain: Not on file  . Food insecurity:    Worry: Not on file    Inability: Not on file  . Transportation needs:    Medical: Not on file    Non-medical: Not on file  Tobacco Use  . Smoking status: Never Smoker  . Smokeless tobacco: Never Used  Substance and Sexual Activity  . Alcohol use: Yes    Alcohol/week: 0.0 standard drinks    Comment: rarely  . Drug use: No  . Sexual activity: Yes  Lifestyle  . Physical activity:    Days per week: Not on file    Minutes per session: Not on file  . Stress: Not on file  Relationships  . Social connections:    Talks on phone: Not on file    Gets together: Not on file    Attends religious service: Not on file    Active member of club or organization: Not on file    Attends meetings of clubs or organizations: Not on file    Relationship status: Not on file  . Intimate partner violence:    Fear of current or ex partner: Not on file    Emotionally abused: Not on file    Physically abused: Not on file    Forced sexual activity: Not on file  Other Topics Concern  . Not on file  Social History Narrative  . Not on file     PHYSICAL EXAM  Vitals:   09/24/18 0817  BP: 90/65  Pulse: 72  Weight: 144 lb (65.3 kg)  Height: '5\' 5"'  (1.651 m)    Body mass index is 23.96 kg/m.   General: The patient is well-developed and well-nourished and in no acute distress  HEENT: Head is normocephalic and atraumatic.  The neck is nontender with a good range of motion.  The pharynx is nonerythematous.  Funduscopic examination shows normal optic disks and retinal vessels.  Extremities: There are no rashes and no edema.  Neurologic Exam  Mental status: The patient is alert and oriented x 3 at the time of the examination. The patient has apparent normal recent and remote memory, with an apparently  normal attention span and concentration ability.   Speech is normal.  Cranial nerves: Extraocular movements are full.    Facial strength is normal.   Sh reports reduced right sensation to temp relative to the left.    Trapezius strength is normal.  The tongue is midline, and the patient has symmetric elevation of the soft palate. No obvious hearing deficits are noted.  Motor:  Muscle bulk is normal.   Tone is normal. Strength is  5 / 5 in all 4 extremities.   Sensory: Sensory testing showed reduced temp and vibration sensation on the right arm relative to the left.   She has reduced touch sensation in her left leg relative to the right but intact vibration sensation in legs  Coordination: Cerebellar testing reveals good finger-nose-finger and slightly reduced left heel-to-shin .  Gait and station: Station is normal.   Gait is normal but tandem is wide.. Romberg is borderline.   Reflexes: Deep tendon reflexes are symmetric and normal in arms and ankles with mild crossed adductor responses at knees. Marland Kitchen       DIAGNOSTIC DATA (LABS, IMAGING, TESTING) - I reviewed patient records, labs, notes, testing and imaging myself where available.  Lab Results  Component Value Date   WBC 6.9 03/25/2018   HGB 14.7 03/25/2018   HCT 43.4 03/25/2018   MCV 88.9 03/25/2018   PLT 317 03/25/2018      ASSESSMENT AND PLAN  Optic neuritis  Other fatigue  Gait disturbance   1.   She had optic neuritis about 10 years ago and has had a couple episodes of eye pain, worse with movements but not associated with visual changes.  Funduscopic examination is normal today.  She does have a follow-up eye appointment next week.  I think MS is unlikely as she had normal brain and spinal cord a few months ago, 10 years after the onset of her optic neuritis.   2.   She will continue Ritalin as needed and amantadine as needed for episodes of severe fatigue +/- mild neurologic symptoms.   3.   rtc 6 months or call  sooner if new or worsening symptoms     A. Felecia Shelling, MD, [facility] 5/61/5379, 4:32 AM Certified in Neurology, Clinical Neurophysiology, Sleep Medicine, Pain Medicine and Neuroimaging  [facility] [address] [place], [place] [address] [phone]

## 2018-09-30 MED FILL — SPIRONOLACTONE 50 MG TABLET: 50 | 30 days supply | Qty: 30 | Fill #2

## 2018-11-17 MED FILL — DOXEPIN 25 MG CAPSULE: 25 | 30 days supply | Qty: 30 | Fill #1 | Status: TO

## 2018-11-17 MED FILL — SPIRONOLACTONE 50 MG TABS: 50 | 90 days supply | Qty: 90 | Fill #0

## 2018-12-27 DIAGNOSIS — Z1231 Encounter for screening mammogram for malignant neoplasm of breast: Secondary | ICD-10-CM | POA: Diagnosis not present

## 2018-12-28 ENCOUNTER — Other Ambulatory Visit: Payer: Self-pay | Admitting: Obstetrics and Gynecology

## 2018-12-28 DIAGNOSIS — R928 Other abnormal and inconclusive findings on diagnostic imaging of breast: Secondary | ICD-10-CM

## 2018-12-31 ENCOUNTER — Ambulatory Visit
Admission: RE | Admit: 2018-12-31 | Discharge: 2018-12-31 | Disposition: A | Discharge status: Home / Self Care | Payer: 59 | Source: Ambulatory Visit | Attending: Obstetrics and Gynecology | Admitting: Obstetrics and Gynecology

## 2018-12-31 ENCOUNTER — Other Ambulatory Visit: Payer: Self-pay | Admitting: Obstetrics and Gynecology

## 2018-12-31 ENCOUNTER — Other Ambulatory Visit: Payer: Self-pay

## 2018-12-31 DIAGNOSIS — R928 Other abnormal and inconclusive findings on diagnostic imaging of breast: Secondary | ICD-10-CM

## 2018-12-31 DIAGNOSIS — N6320 Unspecified lump in the left breast, unspecified quadrant: Secondary | ICD-10-CM

## 2018-12-31 DIAGNOSIS — N6311 Unspecified lump in the right breast, upper outer quadrant: Secondary | ICD-10-CM | POA: Diagnosis not present

## 2018-12-31 DIAGNOSIS — N6012 Diffuse cystic mastopathy of left breast: Secondary | ICD-10-CM | POA: Diagnosis not present

## 2019-01-07 MED FILL — DOXEPIN 25 MG CAPSULE: 25 | 30 days supply | Qty: 30 | Fill #0

## 2019-02-02 ENCOUNTER — Other Ambulatory Visit: Payer: Self-pay | Admitting: Neurology

## 2019-02-02 MED ORDER — METHYLPHENIDATE HCL 10 MG PO TABS
10.0000 mg | ORAL_TABLET | Freq: Two times a day (BID) | ORAL | 0 refills | Status: DC | PRN
Start: 2019-02-02 — End: 2019-06-15

## 2019-02-02 MED FILL — METHYLPHENIDATE 10 MG TAB: 10 | 30 days supply | Qty: 60 | Fill #0

## 2019-02-02 NOTE — Telephone Encounter (Signed)
Pt is needing a refill on her methylphenidate (RITALIN) 10 MG tablet sent to [facility]

## 2019-03-08 DIAGNOSIS — M2550 Pain in unspecified joint: Secondary | ICD-10-CM | POA: Diagnosis not present

## 2019-03-08 DIAGNOSIS — Z0000 Encounter for general adult medical examination without abnormal findings: Secondary | ICD-10-CM | POA: Diagnosis not present

## 2019-03-08 DIAGNOSIS — Z658 Other specified problems related to psychosocial circumstances: Secondary | ICD-10-CM | POA: Diagnosis not present

## 2019-03-08 DIAGNOSIS — G35 Multiple sclerosis: Secondary | ICD-10-CM | POA: Diagnosis not present

## 2019-03-08 DIAGNOSIS — H469 Unspecified optic neuritis: Secondary | ICD-10-CM | POA: Diagnosis not present

## 2019-03-08 DIAGNOSIS — H538 Other visual disturbances: Secondary | ICD-10-CM | POA: Diagnosis not present

## 2019-03-08 DIAGNOSIS — E559 Vitamin D deficiency, unspecified: Secondary | ICD-10-CM | POA: Diagnosis not present

## 2019-03-08 DIAGNOSIS — Z79899 Other long term (current) drug therapy: Secondary | ICD-10-CM | POA: Diagnosis not present

## 2019-03-08 DIAGNOSIS — G43009 Migraine without aura, not intractable, without status migrainosus: Secondary | ICD-10-CM | POA: Diagnosis not present

## 2019-03-08 MED FILL — DOXEPIN HCL 25 MG CAPS: 25 | 90 days supply | Qty: 90 | Fill #0

## 2019-03-08 MED FILL — ACYCLOVIR 400 MG TABLET: 400 | 10 days supply | Qty: 30 | Fill #0

## 2019-03-08 MED FILL — CYCLOBENZAPRINE HCL 10 MG T: 10 | 30 days supply | Qty: 30 | Fill #0

## 2019-03-14 MED FILL — ESTRADIOL 0.1 MG/24HR PTTW: 0.1 | 84 days supply | Qty: 24 | Fill #1

## 2019-04-06 ENCOUNTER — Telehealth: Payer: Self-pay | Admitting: Neurology

## 2019-04-06 NOTE — Telephone Encounter (Signed)
Dr. Sater- FYI 

## 2019-04-06 NOTE — Telephone Encounter (Signed)
FYI-Pt called wanting it documented in her chart that she is not needing anything but that she has been feeling quite fatigued and is having a slight limp when she walks.

## 2019-04-15 ENCOUNTER — Ambulatory Visit: Payer: 59 | Admitting: Neurology

## 2019-05-05 DIAGNOSIS — N390 Urinary tract infection, site not specified: Secondary | ICD-10-CM | POA: Diagnosis not present

## 2019-05-05 DIAGNOSIS — N760 Acute vaginitis: Secondary | ICD-10-CM | POA: Diagnosis not present

## 2019-05-05 DIAGNOSIS — Z113 Encounter for screening for infections with a predominantly sexual mode of transmission: Secondary | ICD-10-CM | POA: Diagnosis not present

## 2019-05-05 MED FILL — SULFAMETHOXAZOLE-TMP DS TAB: 800-160 | 5 days supply | Qty: 10 | Fill #0

## 2019-05-09 MED FILL — NYSTATIN-TRIAMCINOLONE CRM: 100000-0.1 | 25 days supply | Qty: 60 | Fill #0

## 2019-05-10 ENCOUNTER — Encounter: Payer: Self-pay | Admitting: Neurology

## 2019-05-10 ENCOUNTER — Ambulatory Visit: Payer: 59 | Admitting: Neurology

## 2019-05-10 ENCOUNTER — Other Ambulatory Visit: Payer: Self-pay

## 2019-05-10 VITALS — BP 98/68 | HR 72 | Temp 97.3°F | Ht 65.0 in | Wt 146.5 lb

## 2019-05-10 DIAGNOSIS — H5712 Ocular pain, left eye: Secondary | ICD-10-CM | POA: Diagnosis not present

## 2019-05-10 DIAGNOSIS — M542 Cervicalgia: Secondary | ICD-10-CM

## 2019-05-10 DIAGNOSIS — R200 Anesthesia of skin: Secondary | ICD-10-CM | POA: Diagnosis not present

## 2019-05-10 DIAGNOSIS — H469 Unspecified optic neuritis: Secondary | ICD-10-CM

## 2019-05-10 DIAGNOSIS — R5383 Other fatigue: Secondary | ICD-10-CM

## 2019-05-10 DIAGNOSIS — R269 Unspecified abnormalities of gait and mobility: Secondary | ICD-10-CM

## 2019-05-10 NOTE — Progress Notes (Signed)
[facility]  PATIENT: [name] [name] DOB: Oct 14, 1970  REFERRING DOCTOR OR PCP:  Dr. Inda Merlin SOURCE: Notes from PCP and Dr. Nigel Bridgeman, imaging lab reports, MRI images on PACS.  _________________________________   HISTORICAL  CHIEF COMPLAINT:  Chief Complaint  Patient presents with   Follow-up    RM 13, alone. Last seen 09/24/18.    HISTORY OF PRESENT ILLNESS:  Gracy Ehly is a 48 y.o. woman with a history of optic neuritis and who has undergone evaluation for possible multiple sclerosis.  Update 05/10/2019:  She has no new symptom.    She has some left eye pain at times.    Vision is slightly blurry out of the left eye.   She notes left eye pain is usually much worse when she has neck pain.  She is only experiencing neck pain on the left and it radiates towards the eye.  There is a pressure sensation near the orbit and more crampy sensation in the neck.  She has had some numbness at times in the face and left arm and leg but this is not constant.  She works at [employer].  Fatigue, focus and attention are doing well on Ritalin.  She only takes the medication as needed.  Update 07/26/19: She has eye pain on the left but no visual changes or color saturation changes.   This  Is similar to pain in the past.      She is also noting more trouble with her balance the past week but was fine earlier.      She notes mild stiffness in her neck.     Focus and attention is good on Ritalin.     She does not take it everyday.       Update 05/31/2018: She reports a new problem of left eye pain about 1 1/2 ago.  Pain was constant and did not fluctuate with eye movements,    She felt the pain improved with motrin mildly.  The pain is similar to pain that she experienced 10 years ago when she had optic neuritis.  She also felt there was slight changes in vision that was fluctuating.     She did not note change in color vision.  She does not note much change in vision  but there has been some fluctuation in acuity.   She saw Dr. Katy Fitch and she reports he felt she was having resolving optic neuritis or another process.  She had an episode of fecal incontinence.  This has never happened in the past.  She was washing dishes and without any notice or urge had fecal incontinence.   There was no urge and no diarrhea.       She feels gait is okay.    She is more tired.   She denies any pain in the neck or upper back or lower back.  She notes no stiffness in her spine.  She also feels that her gait is worse and she feels off balance.  This is also new.   She has experienced some difficulty with gait ataxia in the past that has been intermittent and evaluation has been negative in the past.  An MRI of the cervical spine in 2017 did not show any demyelinating plaques or significant DJD.  MRI of the thoracic spine was last performed in 2009 and was, by report, normal.     There has been concern of MS in the past due to her optic neuritis  10 years ago.  However, subsequent MRIs have been essentially normal with no evidence of demyelination and a lumbar puncture had shown normal CSF in the past.   Ritalin has helped her focus and attention but she still has some fatigue.     Update 04/12/2018: She had optic neuritis in 2009 and has had multiple imaging studies of the brain and spine since that time.   They do not show evidence of demyelination.  Additionally, lumbar puncture in the past did not show oligoclonal bands and a vasculitis work-up was negative.   I personally reviewed images of the MRIs at the last visit.  At the last visit, I discussed with her that given the duration of time since her optic neuritis and multiple subsequent imaging studies, it is unlikely that she will develop MS.  Due to her fatigue, at the last visit we did check Epstein-Barr antibodies but the pattern is most consistent with her having had the virus at some time in the past but not having a chronic  active form.   The anti-neuromyelitis optica antibodies were negative.    Since the last visit, she has had 2 episodes.   She noted some fatigue, eye pain and clumsiness in the left leg affecting gait. The second episode was similar except both legs seem affected.   She took Ritalin and amantadine and felt much better.   The episodes lasted about 2 weeks  Fatigue in general is fine but she is sensitive to the heat.     From 10/12/2017: In 2009, she lost vision in her left eye.   She had a black central dot and blurriness around that.    She felt symptoms resolved within an hour but was back the next day.    She had a similar recurrence the next day.    She saw ophthalmology (Dr. Zadie Rhine) and was diagnosed with optic neuritis.    MRI's of the brain and cervical spine were normal.     She had numbness in her face shortly later.    She was referred to Dr. Hart Robinsons and then another doctor at [facility].    MS was not confirmed.   She saw Dr. Jacqulynn Cadet who felt she probably had MS but did not start a DMT.    She then had an episode of left leg numbness and left foot drop that comes and goes for a day or two and still does a couple times a year.    She also has a lot of fatigue (physical > cognitive).   When this occurs, she is more likely to have an episode involving the left leg.   She has had a couple more episodes of blurry vision but none the past year.   She has had steroids (starting with 200 mg and tapering down over a week or so) about twice a year.   The last time was summer 2018.  Some episodes are associated with her gait worsening and even her speech being off.     She always feels better for a few months afterwards.     She is on Ritalin 10 mg po bid and that usually helps her fatigue.  She sleeps well most every night.   She denies any major problems with mood.    Right now, she denies any issues with her gait, strength, sensation or balance. Patient is doing well. She continues to have fatigue.       She was initially evaluated by Dr. Erling Cruz  and had MRI studies, evoked potential studies CSF analysis (no oligoclonal bands and IgG index was normal) and blood work including NMO, anti-cardiolipin antibody, ANA, Ace, RPR, ESR, B12, Lyme. The blood work was normal or negative.  I personally reviewed the MRIs of the brain dated 05/10/2017 month 08/14/2014 and 05/10/2008 an MRI of the cervical spine from 12/16/2016 and MRI the thoracic spine from 08/16/2008. There were no demyelinating lesions in any of the MRIs  REVIEW OF SYSTEMS: Constitutional: No fevers, chills, sweats, or change in appetite.   Notes fatigue Eyes: As above Ear, nose and throat: No hearing loss, ear pain, nasal congestion, sore throat Cardiovascular: No chest pain, palpitations Respiratory: No shortness of breath at rest or with exertion.   No wheezes GastrointestinaI: No nausea, vomiting, diarrhea, abdominal pain.  She had one episode of fecal incontinence. Genitourinary: No dysuria, urinary retention or frequency.  No nocturia. Musculoskeletal: No neck pain, back pain Integumentary: No rash, pruritus, skin lesions Neurological: as above Psychiatric: No depression at this time.  No anxiety Endocrine: No palpitations, diaphoresis, change in appetite, change in weigh or increased thirst Hematologic/Lymphatic: No anemia, purpura, petechiae. Allergic/Immunologic: No itchy/runny eyes, nasal congestion, recent allergic reactions, rashes  ALLERGIES: Allergies  Allergen Reactions   Venlafaxine Hives    blisters   Augmentin [Amoxicillin-Pot Clavulanate] Rash    tongue swelling Has patient had a PCN reaction causing immediate rash, facial/tongue/throat swelling, SOB or lightheadedness with hypotension: No Has patient had a PCN reaction causing severe rash involving mucus membranes or skin necrosis: No Has patient had a PCN reaction that required hospitalization: No Has patient had a PCN reaction occurring within the last  10 years: No If all of the above answers are "NO", then may proceed with Cephalosporin use.    Demerol [Meperidine] Swelling and Rash    Tongue swelling   Estrogens Rash    The patient can use patch,No oral   Gabapentin Rash   Stadol [Butorphanol] Swelling and Rash    Tongue swelling    HOME MEDICATIONS:  Current Outpatient Medications:    Amantadine HCl 100 MG tablet, Take 100 mg by mouth 2 (two) times daily as needed (for multiple sclerosis flares). , Disp: , Rfl: 3   cetirizine (ZYRTEC) 10 MG tablet, Take 10 mg by mouth at bedtime. , Disp: , Rfl:    cyclobenzaprine (FLEXERIL) 10 MG tablet, Take 10 mg by mouth 2 (two) times daily as needed (for multiple sclerosis flares). , Disp: , Rfl: 0   estradiol (MINIVELLE) 0.1 MG/24HR patch, Place 1 patch (0.1 mg total) onto the skin 2 (two) times a week., Disp: 8 patch, Rfl: 12   methylphenidate (RITALIN) 10 MG tablet, Take 1 tablet (10 mg total) by mouth 2 (two) times daily as needed (for multiple sclerosis fatigue)., Disp: 60 tablet, Rfl: 0  PAST MEDICAL HISTORY: Past Medical History:  Diagnosis Date   Allergy    Anxiety    Cancer ([place])    basal cell skin ca   Depression    Family history of adverse reaction to anesthesia    sister woke up during surgery age 30's for tonsillectomy   Multiple sclerosis ([place])    Neuromuscular disorder ([place])    multiple sclerosis   Vision abnormalities     PAST SURGICAL HISTORY: Past Surgical History:  Procedure Laterality Date   adnoidectomy     BUNIONECTOMY Left    foot   CESAREAN SECTION     CHOLECYSTECTOMY     ROBOTIC ASSISTED LAPAROSCOPIC  HYSTERECTOMY AND SALPINGECTOMY N/A 01/19/2018   Procedure: XI ROBOTIC ASSISTED LAPAROSCOPIC HYSTERECTOMY AND BILATERAL SALPINGECTOMY, LEFT OVARIAN CYSTECTOMY;  Surgeon: Everitt Amber, MD;  Location: [facility];  Service: Gynecology;  Laterality: N/A;   wisdom teeth exraction      FAMILY HISTORY: Family History  Problem Relation Age of  Onset   Cervical cancer Mother    Dementia Mother    Melanoma Father    Ulcerative colitis Sister    Colon cancer Neg Hx    Esophageal cancer Neg Hx    Rectal cancer Neg Hx    Stomach cancer Neg Hx     SOCIAL HISTORY:  Social History   Socioeconomic History   Marital status: Divorced    Spouse name: Not on file   Number of children: 2   Years of education: Not on file   Highest education level: Not on file  Occupational History   Not on file  Social Needs   Financial resource strain: Not on file   Food insecurity    Worry: Not on file    Inability: Not on file   Transportation needs    Medical: Not on file    Non-medical: Not on file  Tobacco Use   Smoking status: Never Smoker   Smokeless tobacco: Never Used  Substance and Sexual Activity   Alcohol use: Yes    Alcohol/week: 0.0 standard drinks    Comment: rarely   Drug use: No   Sexual activity: Yes  Lifestyle   Physical activity    Days per week: Not on file    Minutes per session: Not on file   Stress: Not on file  Relationships   Social connections    Talks on phone: Not on file    Gets together: Not on file    Attends religious service: Not on file    Active member of club or organization: Not on file    Attends meetings of clubs or organizations: Not on file    Relationship status: Not on file   Intimate partner violence    Fear of current or ex partner: Not on file    Emotionally abused: Not on file    Physically abused: Not on file    Forced sexual activity: Not on file  Other Topics Concern   Not on file  Social History Narrative   Not on file     PHYSICAL EXAM  Vitals:   05/10/19 1053  BP: 98/68  Pulse: 72  Temp: (!) 97.3 F (36.3 C)  SpO2: 94%  Weight: 146 lb 8 oz (66.5 kg)  Height: _0  (1.651 m)    Body mass index is 24.38 kg/m.   General: The patient is well-developed and well-nourished and in no acute distress  HEENT: Head is normocephalic  and atraumatic.  The neck is tender over the left occiput..  Funduscopic examination shows normal optic disks and retinal vessels.  Extremities: There are no rashes and no edema.  Neurologic Exam  Mental status: The patient is alert and oriented x 3 at the time of the examination. The patient has apparent normal recent and remote memory, with an apparently normal attention span and concentration ability.   Speech is normal.  Cranial nerves: Extraocular movements are full.   Facial strength is normal.  Trapezius strength is normal.. No obvious hearing deficits are noted.  Motor:  Muscle bulk is normal.   Tone is normal. Strength is  5 / 5 in all 4 extremities.  Sensory: Temperature and touch sensation was symmetric  Coordination: Cerebellar testing reveals good finger-nose-finger and slightly reduced left heel-to-shin .  Gait and station: Station is normal.   Gait is normal but tandem is wide.. Romberg is borderline.   Reflexes: Deep tendon reflexes are symmetric and normal in arms and ankles with mild crossed adductor responses at knees. .        ASSESSMENT AND PLAN  Optic neuritis  Other fatigue  Numbness  Gait disturbance  Neck pain  Pain of left orbit   1.   TPI left splenius capitus with 80 mg Depo-Medrol in 3 cc Marcaine using sterile technique.  She tolerated the procedure well and pain was better afterwards. 2.   She will continue Ritalin as needed and amantadine as needed for episodes of severe fatigue +/- mild neurologic symptoms.   3.   rtc 6 months or call sooner if new or worsening symptoms    Selim Durden A. Felecia Shelling, MD, [facility] 11/14/4663, 9:93 PM Certified in Neurology, Clinical Neurophysiology, Sleep Medicine, Pain Medicine and Neuroimaging  [facility] [address], [place],  [address] [phone]

## 2019-05-11 MED FILL — SPIRONOLACTONE 50 MG TABS: 50 | 90 days supply | Qty: 90 | Fill #1

## 2019-05-19 DIAGNOSIS — L570 Actinic keratosis: Secondary | ICD-10-CM | POA: Diagnosis not present

## 2019-05-19 DIAGNOSIS — L309 Dermatitis, unspecified: Secondary | ICD-10-CM | POA: Diagnosis not present

## 2019-05-19 DIAGNOSIS — D485 Neoplasm of uncertain behavior of skin: Secondary | ICD-10-CM | POA: Diagnosis not present

## 2019-05-19 DIAGNOSIS — L820 Inflamed seborrheic keratosis: Secondary | ICD-10-CM | POA: Diagnosis not present

## 2019-05-19 DIAGNOSIS — Z85828 Personal history of other malignant neoplasm of skin: Secondary | ICD-10-CM | POA: Diagnosis not present

## 2019-05-30 DIAGNOSIS — T7840XA Allergy, unspecified, initial encounter: Secondary | ICD-10-CM | POA: Diagnosis not present

## 2019-05-30 DIAGNOSIS — J069 Acute upper respiratory infection, unspecified: Secondary | ICD-10-CM | POA: Diagnosis not present

## 2019-05-30 MED FILL — predniSONE 20 MG TABS: 20 | 5 days supply | Qty: 5 | Fill #0

## 2019-06-15 ENCOUNTER — Other Ambulatory Visit: Payer: Self-pay | Admitting: Neurology

## 2019-06-15 MED ORDER — METHYLPHENIDATE HCL 10 MG PO TABS: 10.0000 mg | ORAL_TABLET | Freq: Two times a day (BID) | ORAL | 0 refills | Status: DC | PRN

## 2019-06-15 MED FILL — METHYLPHENIDATE 10 MG TAB: 10 | 30 days supply | Qty: 60 | Fill #0

## 2019-06-15 NOTE — Addendum Note (Signed)
Addended by: Lester [place] A on: 06/15/2019 09:56 AM   Modules accepted: Orders

## 2019-06-15 NOTE — Telephone Encounter (Signed)
Pt has called for a refill on her methylphenidate (RITALIN) 10 MG tablet methylphenidate (RITALIN) 10 MG tablet

## 2019-06-15 NOTE — Telephone Encounter (Signed)
Pt is up to date on her appts. Pt is due for a refill on ritalin. [place] Controlled Substance Registry checked. Will send request to Dr. Felecia Shelling for further review.

## 2019-07-13 MED FILL — ACYCLOVIR 200 MG CAP: 200 | 10 days supply | Qty: 60 | Fill #0

## 2019-07-15 DIAGNOSIS — J029 Acute pharyngitis, unspecified: Secondary | ICD-10-CM | POA: Diagnosis not present

## 2019-07-20 ENCOUNTER — Other Ambulatory Visit: Payer: Self-pay | Admitting: Internal Medicine

## 2019-07-20 ENCOUNTER — Ambulatory Visit
Admission: RE | Admit: 2019-07-20 | Discharge: 2019-07-20 | Disposition: A | Discharge status: Home / Self Care | Payer: 59 | Source: Ambulatory Visit | Attending: Internal Medicine | Admitting: Internal Medicine

## 2019-07-20 DIAGNOSIS — R61 Generalized hyperhidrosis: Secondary | ICD-10-CM

## 2019-07-20 DIAGNOSIS — R0600 Dyspnea, unspecified: Secondary | ICD-10-CM | POA: Diagnosis not present

## 2019-07-20 DIAGNOSIS — R05 Cough: Secondary | ICD-10-CM | POA: Diagnosis not present

## 2019-07-20 DIAGNOSIS — R5383 Other fatigue: Secondary | ICD-10-CM | POA: Diagnosis not present

## 2019-07-20 DIAGNOSIS — R519 Headache, unspecified: Secondary | ICD-10-CM | POA: Diagnosis not present

## 2019-08-02 DIAGNOSIS — Z6825 Body mass index (BMI) 25.0-25.9, adult: Secondary | ICD-10-CM | POA: Diagnosis not present

## 2019-08-02 DIAGNOSIS — Z01419 Encounter for gynecological examination (general) (routine) without abnormal findings: Secondary | ICD-10-CM | POA: Diagnosis not present

## 2019-08-02 DIAGNOSIS — N951 Menopausal and female climacteric states: Secondary | ICD-10-CM | POA: Diagnosis not present

## 2019-08-02 IMAGING — CT CT ABD-PELV W/ CM
2 of 6 series · 16 of 46 positions shown, 18 images · IV contrast (ISOVUE 300)
Comparison: None.

CLINICAL DATA: Left lower quadrant pain and pressure. 2 months
postop from hysterectomy and left ovarian cystectomy.

EXAM:
CT ABDOMEN AND PELVIS WITH CONTRAST
TECHNIQUE: Multidetector CT imaging of the abdomen and pelvis was performed
using the standard protocol following bolus administration of
intravenous contrast.
CONTRAST:  100mL 94FLVF-7BB IOPAMIDOL (94FLVF-7BB) INJECTION 61%

[Series 2: axial st · axial · 0.69mm/px · z∈[-479,-99]mm · 13 of 90 slices shown, 15 images]
[im 7/90  soft-tissue]
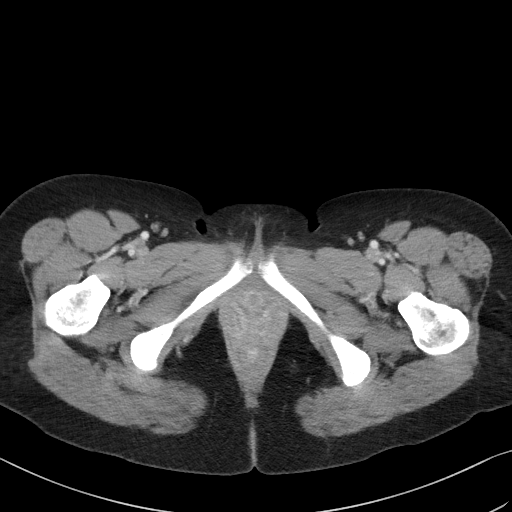
[im 7/90  bone]
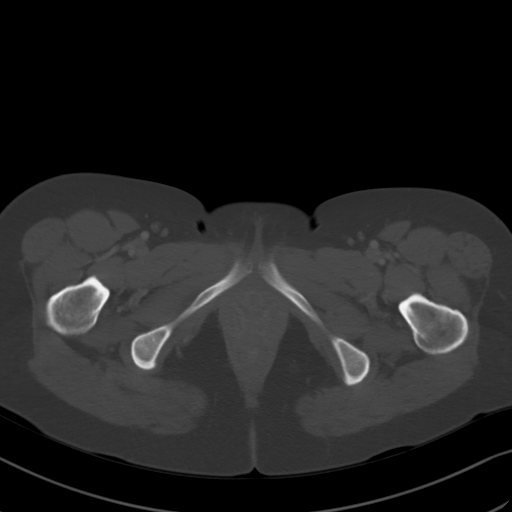
[im 13/90  soft-tissue]
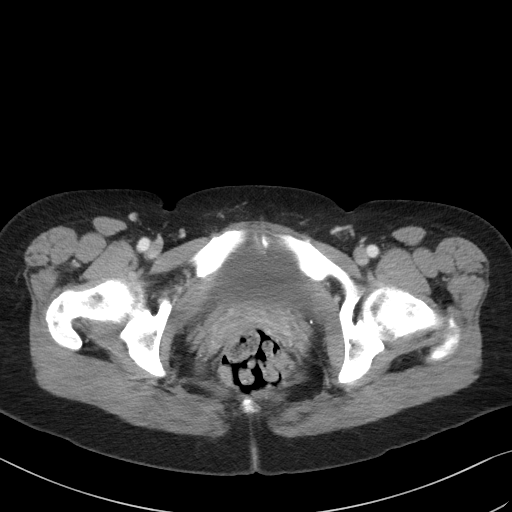
[im 20/90  soft-tissue]
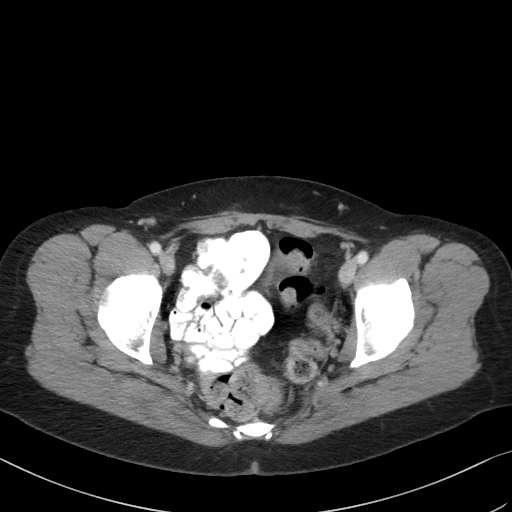
[im 26/90  soft-tissue]
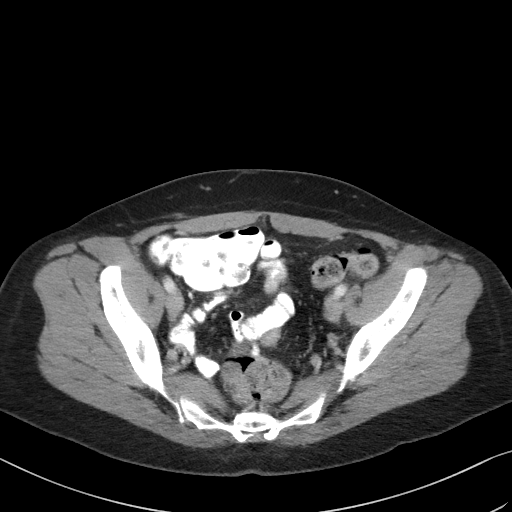
[im 32/90  soft-tissue]
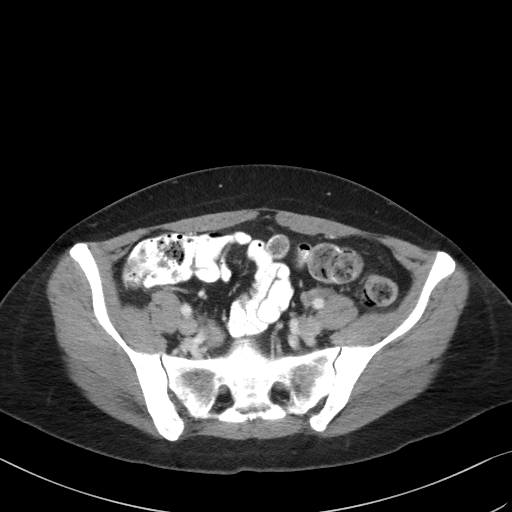
[im 39/90  soft-tissue]
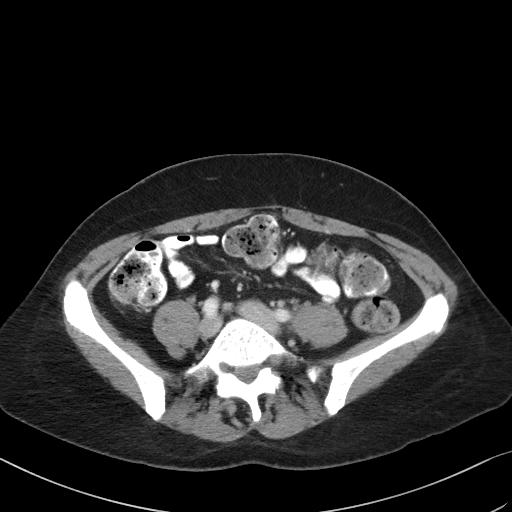
[im 45/90  soft-tissue]
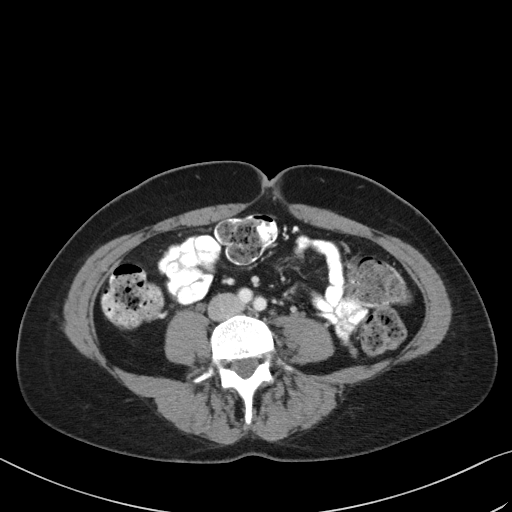
[im 51/90  soft-tissue]
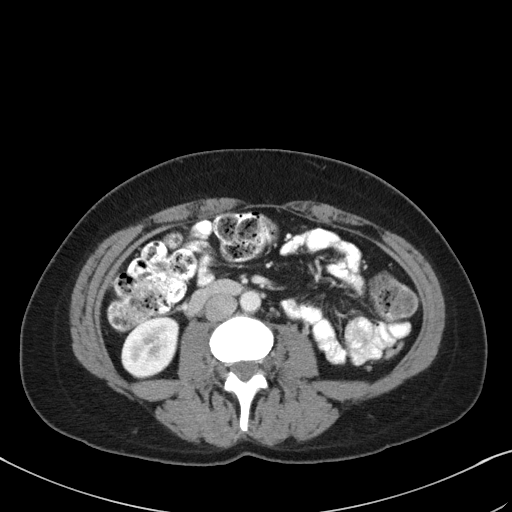
[im 58/90  soft-tissue]
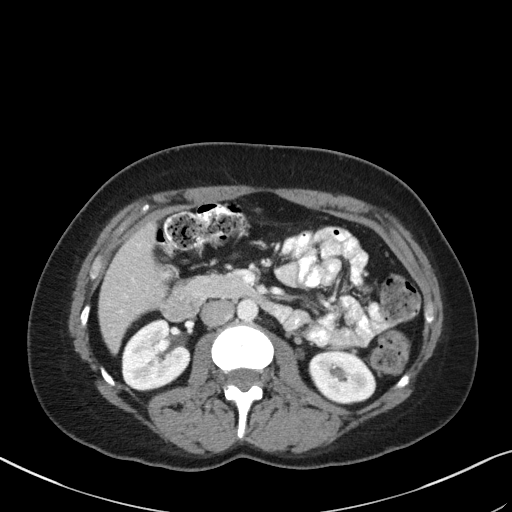
[im 58/90  bone]
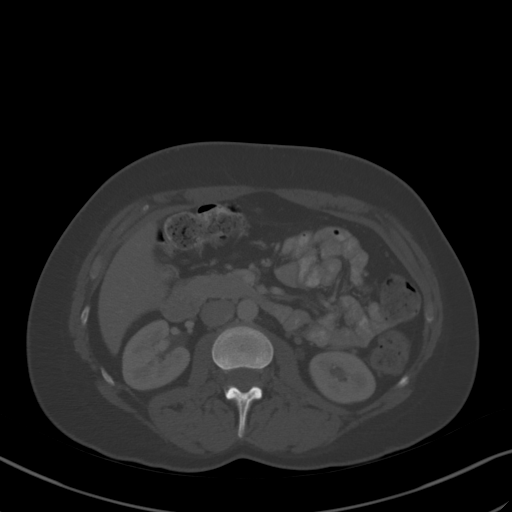
[im 64/90  soft-tissue]
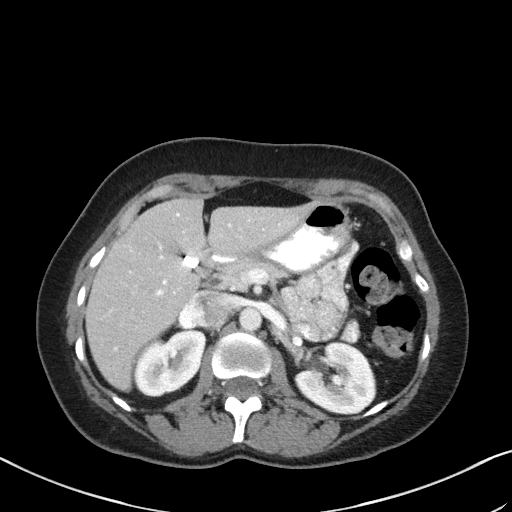
[im 70/90  soft-tissue]
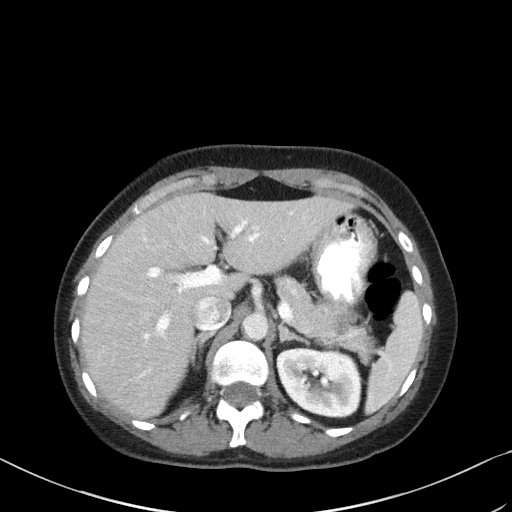
[im 77/90  soft-tissue]
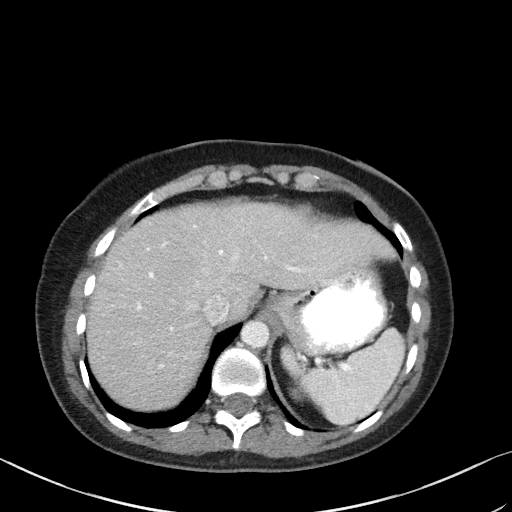
[im 83/90  soft-tissue]
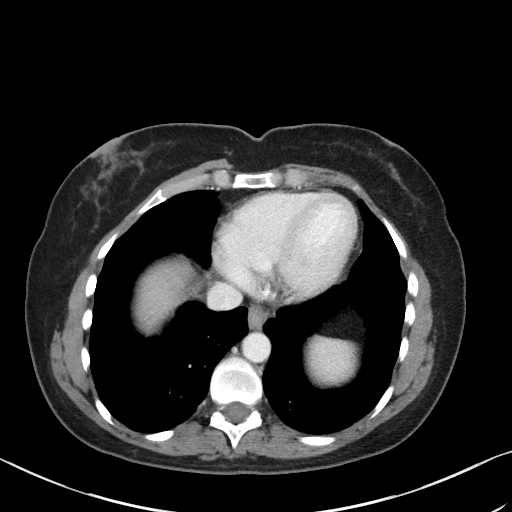

[Series 4: coronal st · coronal · 0.74mm/px · 3 of 67 slices shown]
[im 23/67  soft-tissue]
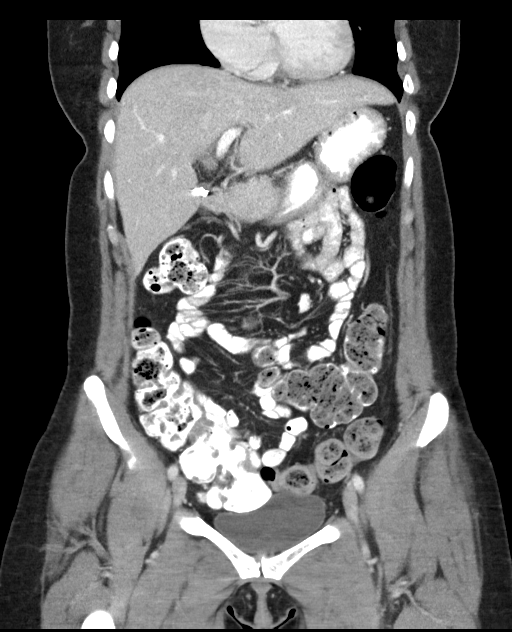
[im 30/67  soft-tissue]
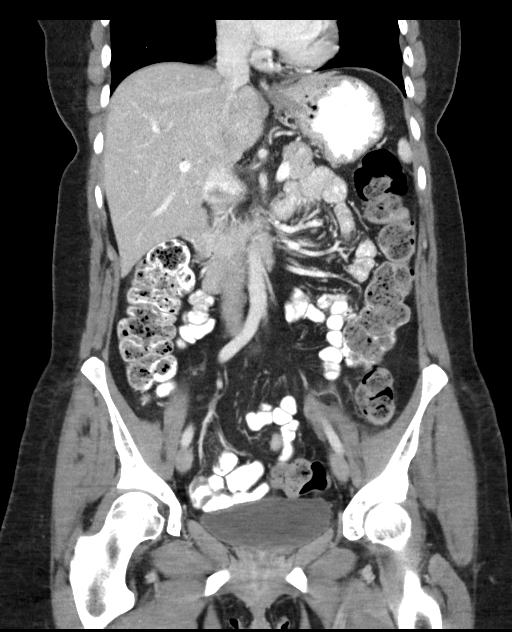
[im 37/67  soft-tissue]
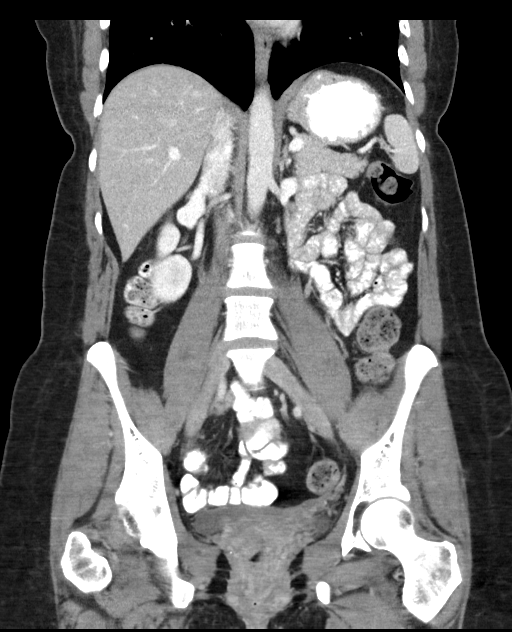

[16 of 46 positions shown; findings below may reference images not displayed]

FINDINGS: Lower Chest: No acute findings.

Hepatobiliary: No hepatic masses identified. Prior cholecystectomy.
No evidence of biliary obstruction.

Pancreas:  No mass or inflammatory changes.

Spleen: Within normal limits in size and appearance.

Adrenals/Urinary Tract: No masses identified. No evidence of
hydronephrosis. No evidence of ureteral or bladder injury.

Stomach/Bowel: No evidence of obstruction, inflammatory process or
abnormal fluid collections.

Vascular/Lymphatic: No pathologically enlarged lymph nodes. No
abdominal aortic aneurysm.

Reproductive: Previous hysterectomy. No abnormal fluid collections
or inflammatory changes identified.

Other:  None.

Musculoskeletal:  No suspicious bone lesions identified.
IMPRESSION: Unremarkable exam status post hysterectomy. No evidence of abscess,
lower urinary tract injury, or other complication.

## 2019-08-02 MED FILL — ESTRADIOL 0.05 MG/24HR PTTW: 0.05 | 84 days supply | Qty: 24 | Fill #0

## 2019-08-26 MED FILL — URO-MP CAPSULE: 118 | 5 days supply | Qty: 15 | Fill #0

## 2019-08-26 MED FILL — NITROFURANTOIN MONO-MCR 100: 100 | 7 days supply | Qty: 14 | Fill #0

## 2019-09-05 DIAGNOSIS — N390 Urinary tract infection, site not specified: Secondary | ICD-10-CM | POA: Diagnosis not present

## 2019-09-05 DIAGNOSIS — R109 Unspecified abdominal pain: Secondary | ICD-10-CM | POA: Diagnosis not present

## 2019-09-05 DIAGNOSIS — R310 Gross hematuria: Secondary | ICD-10-CM | POA: Diagnosis not present

## 2019-10-03 DIAGNOSIS — N393 Stress incontinence (female) (male): Secondary | ICD-10-CM | POA: Diagnosis not present

## 2019-10-03 DIAGNOSIS — N3001 Acute cystitis with hematuria: Secondary | ICD-10-CM | POA: Diagnosis not present

## 2019-10-11 MED FILL — SPIRONOLACTONE 50 MG TABLET: 50 | 60 days supply | Qty: 60 | Fill #0

## 2019-10-12 DIAGNOSIS — N3001 Acute cystitis with hematuria: Secondary | ICD-10-CM | POA: Diagnosis not present

## 2019-10-13 ENCOUNTER — Telehealth: Payer: Self-pay | Admitting: Neurology

## 2019-10-13 NOTE — Telephone Encounter (Signed)
Called pt back. She has not felt good since second injection of covid-19 vaccine that she received 2-3 weeks ago. Wants to discuss medications she is on that Dr. Felecia Shelling prescribes. Does not feel they are helping. She has an appt with urology in March 2021. I scheduled earlier f/u with Dr. Felecia Shelling on 10/19/19 at 9:30am. Advised her to check in by 9am and to wear a mask. Cx appt she had in March with our office. She verbalized understanding.

## 2019-10-13 NOTE — Telephone Encounter (Signed)
Pt called needing to speak to RN. She would like to discuss her issues with her urinating herself, her fatigue and her methylphenidate (RITALIN) 10 MG tablet that seems to not be working. Please advise.

## 2019-10-19 ENCOUNTER — Ambulatory Visit (INDEPENDENT_AMBULATORY_CARE_PROVIDER_SITE_OTHER): Payer: 59 | Admitting: Neurology

## 2019-10-19 ENCOUNTER — Encounter: Payer: Self-pay | Admitting: Neurology

## 2019-10-19 ENCOUNTER — Other Ambulatory Visit: Payer: Self-pay

## 2019-10-19 VITALS — BP 123/75 | HR 76 | Temp 97.0°F | Ht 65.0 in | Wt 153.0 lb

## 2019-10-19 DIAGNOSIS — R5383 Other fatigue: Secondary | ICD-10-CM | POA: Diagnosis not present

## 2019-10-19 DIAGNOSIS — M542 Cervicalgia: Secondary | ICD-10-CM | POA: Diagnosis not present

## 2019-10-19 DIAGNOSIS — H469 Unspecified optic neuritis: Secondary | ICD-10-CM

## 2019-10-19 DIAGNOSIS — R269 Unspecified abnormalities of gait and mobility: Secondary | ICD-10-CM

## 2019-10-19 MED ORDER — METHYLPHENIDATE HCL ER (OSM) 36 MG PO TBCR: 36.0000 mg | EXTENDED_RELEASE_TABLET | Freq: Every day | ORAL | 0 refills | Status: DC

## 2019-10-19 MED FILL — CONCERTA 36 MG TABLET ER: 36 | 30 days supply | Qty: 30 | Fill #0

## 2019-10-19 NOTE — Progress Notes (Signed)
[facility]  PATIENT: [name] [name] DOB: 1971/08/14  REFERRING DOCTOR OR PCP:  Dr. Inda Merlin SOURCE: Notes from PCP and Dr. Nigel Bridgeman, imaging lab reports, MRI images on PACS.  _________________________________   HISTORICAL  CHIEF COMPLAINT:  Chief Complaint  Patient presents with  . Follow-up    RM 12, alone. Last seen 05/19/2019. She is having alot of fatigue. Since the second covid-19 iinjection she has not felt the same. Has urine incontinence intermittently. Does not feel ritalin is helping. In bed by 7p and sleeping 10-12 hr and does not feel well rested.     HISTORY OF PRESENT ILLNESS:  [name] [name] is a 49 y.o. woman with a history of optic neuritis and who has undergone evaluation for possible multiple sclerosis.  Update 10/19/2019: Her headaches have done better since the TPI last visit.   No neck pain now.  She had optic neuritis 11 years ago. She also has had urinary incontinence.  She was having some numbness and gait issues so she had  MRI of the brain and spine 06/23/2018.  There was no evidence of demyelination or myelopathy.  She has mild spinal stenosis at [facility]          She is feeling more fatigued, despite Ritalin 10 mg po bid.  Fatigue worsened after the second Covid vaccination [employer]).   She has been sleeping 10 hours a day but is still sleepy and tired all day.   She does not snore.    She has some reduced focus/attention.  Initially, Ritalin was helping her at first.     Update 05/10/2019:  She has no new symptom.    She has some left eye pain at times.    Vision is slightly blurry out of the left eye.   She notes left eye pain is usually much worse when she has neck pain.  She is only experiencing neck pain on the left and it radiates towards the eye.  There is a pressure sensation near the orbit and more crampy sensation in the neck.  She has had some numbness at times in the face and left arm and leg but this is not constant.  She works at  [employer].  Fatigue, focus and attention are doing well on Ritalin.  She only takes the medication as needed.  Update 07/26/19: She has eye pain on the left but no visual changes or color saturation changes.   This  Is similar to pain in the past.      She is also noting more trouble with her balance the past week but was fine earlier.      She notes mild stiffness in her neck.     Focus and attention is good on Ritalin.     She does not take it everyday.       Update 05/31/2018: She reports a new problem of left eye pain about 1 1/2 ago.  Pain was constant and did not fluctuate with eye movements,    She felt the pain improved with motrin mildly.  The pain is similar to pain that she experienced 10 years ago when she had optic neuritis.  She also felt there was slight changes in vision that was fluctuating.     She did not note change in color vision.  She does not note much change in vision but there has been some fluctuation in acuity.   She saw Dr. Katy Fitch and she reports he felt she  was having resolving optic neuritis or another process.  She had an episode of fecal incontinence.  This has never happened in the past.  She was washing dishes and without any notice or urge had fecal incontinence.   There was no urge and no diarrhea.       She feels gait is okay.    She is more tired.   She denies any pain in the neck or upper back or lower back.  She notes no stiffness in her spine.  She also feels that her gait is worse and she feels off balance.  This is also new.   She has experienced some difficulty with gait ataxia in the past that has been intermittent and evaluation has been negative in the past.  An MRI of the cervical spine in 2017 did not show any demyelinating plaques or significant DJD.  MRI of the thoracic spine was last performed in 2009 and was, by report, normal.     There has been concern of MS in the past due to her optic neuritis 10 years ago.  However, subsequent MRIs  have been essentially normal with no evidence of demyelination and a lumbar puncture had shown normal CSF in the past.   Ritalin has helped her focus and attention but she still has some fatigue.     Update 04/12/2018: She had optic neuritis in 2009 and has had multiple imaging studies of the brain and spine since that time.   They do not show evidence of demyelination.  Additionally, lumbar puncture in the past did not show oligoclonal bands and a vasculitis work-up was negative.   I personally reviewed images of the MRIs at the last visit.  At the last visit, I discussed with her that given the duration of time since her optic neuritis and multiple subsequent imaging studies, it is unlikely that she will develop MS.  Due to her fatigue, at the last visit we did check Epstein-Barr antibodies but the pattern is most consistent with her having had the virus at some time in the past but not having a chronic active form.   The anti-neuromyelitis optica antibodies were negative.    Since the last visit, she has had 2 episodes.   She noted some fatigue, eye pain and clumsiness in the left leg affecting gait. The second episode was similar except both legs seem affected.   She took Ritalin and amantadine and felt much better.   The episodes lasted about 2 weeks  Fatigue in general is fine but she is sensitive to the heat.     From 10/12/2017: In 2009, she lost vision in her left eye.   She had a black central dot and blurriness around that.    She felt symptoms resolved within an hour but was back the next day.    She had a similar recurrence the next day.    She saw ophthalmology (Dr. Zadie Rhine) and was diagnosed with optic neuritis.    MRI's of the brain and cervical spine were normal.     She had numbness in her face shortly later.    She was referred to Dr. Hart Robinsons and then another doctor at [facility].    MS was not confirmed.   She saw Dr. Jacqulynn Cadet who felt she probably had MS but did not start a DMT.    She  then had an episode of left leg numbness and left foot drop that comes and goes for a day or two  and still does a couple times a year.    She also has a lot of fatigue (physical > cognitive).   When this occurs, she is more likely to have an episode involving the left leg.   She has had a couple more episodes of blurry vision but none the past year.   She has had steroids (starting with 200 mg and tapering down over a week or so) about twice a year.   The last time was summer 2018.  Some episodes are associated with her gait worsening and even her speech being off.     She always feels better for a few months afterwards.     She is on Ritalin 10 mg po bid and that usually helps her fatigue.  She sleeps well most every night.   She denies any major problems with mood.    Right now, she denies any issues with her gait, strength, sensation or balance. Patient is doing well. She continues to have fatigue.      She was initially evaluated by Dr. Erling Cruz and had MRI studies, evoked potential studies CSF analysis (no oligoclonal bands and IgG index was normal) and blood work including NMO, anti-cardiolipin antibody, ANA, Ace, RPR, ESR, B12, Lyme. The blood work was normal or negative.  I personally reviewed the MRIs of the brain dated 05/10/2017 month 08/14/2014 and 05/10/2008 an MRI of the cervical spine from 12/16/2016 and MRI the thoracic spine from 08/16/2008. There were no demyelinating lesions in any of the MRIs  REVIEW OF SYSTEMS: Constitutional: No fevers, chills, sweats, or change in appetite.   Notes fatigue Eyes: As above Ear, nose and throat: No hearing loss, ear pain, nasal congestion, sore throat Cardiovascular: No chest pain, palpitations Respiratory: No shortness of breath at rest or with exertion.   No wheezes GastrointestinaI: No nausea, vomiting, diarrhea, abdominal pain.  She had one episode of fecal incontinence. Genitourinary: No dysuria, urinary retention or frequency.  No  nocturia. Musculoskeletal: No neck pain, back pain Integumentary: No rash, pruritus, skin lesions Neurological: as above Psychiatric: No depression at this time.  No anxiety Endocrine: No palpitations, diaphoresis, change in appetite, change in weigh or increased thirst Hematologic/Lymphatic: No anemia, purpura, petechiae. Allergic/Immunologic: No itchy/runny eyes, nasal congestion, recent allergic reactions, rashes  ALLERGIES: Allergies  Allergen Reactions  . Venlafaxine Hives    blisters  . Augmentin [Amoxicillin-Pot Clavulanate] Rash    tongue swelling Has patient had a PCN reaction causing immediate rash, facial/tongue/throat swelling, SOB or lightheadedness with hypotension: No Has patient had a PCN reaction causing severe rash involving mucus membranes or skin necrosis: No Has patient had a PCN reaction that required hospitalization: No Has patient had a PCN reaction occurring within the last 10 years: No If all of the above answers are "NO", then may proceed with Cephalosporin use.   . Demerol [Meperidine] Swelling and Rash    Tongue swelling  . Estrogens Rash    The patient can use patch,No oral  . Gabapentin Rash  . Stadol [Butorphanol] Swelling and Rash    Tongue swelling    HOME MEDICATIONS:  Current Outpatient Medications:  .  cetirizine (ZYRTEC) 10 MG tablet, Take 10 mg by mouth at bedtime. , Disp: , Rfl:  .  estradiol (MINIVELLE) 0.1 MG/24HR patch, Place 1 patch (0.1 mg total) onto the skin 2 (two) times a week., Disp: 8 patch, Rfl: 12 .  methylphenidate (CONCERTA) 36 MG PO CR tablet, Take 1 tablet (36 mg total) by mouth  daily., Disp: 30 tablet, Rfl: 0  PAST MEDICAL HISTORY: Past Medical History:  Diagnosis Date  . Allergy   . Anxiety   . Cancer ([place])    basal cell skin ca  . Depression   . Family history of adverse reaction to anesthesia    sister woke up during surgery age 44's for tonsillectomy  . Multiple sclerosis ([place])   . Neuromuscular disorder  ([place])    multiple sclerosis  . Vision abnormalities     PAST SURGICAL HISTORY: Past Surgical History:  Procedure Laterality Date  . adnoidectomy    . BUNIONECTOMY Left    foot  . CESAREAN SECTION    . CHOLECYSTECTOMY    . ROBOTIC ASSISTED LAPAROSCOPIC HYSTERECTOMY AND SALPINGECTOMY N/A 01/19/2018   Procedure: XI ROBOTIC ASSISTED LAPAROSCOPIC HYSTERECTOMY AND BILATERAL SALPINGECTOMY, LEFT OVARIAN CYSTECTOMY;  Surgeon: Everitt Amber, MD;  Location: [facility];  Service: Gynecology;  Laterality: N/A;  . wisdom teeth exraction      FAMILY HISTORY: Family History  Problem Relation Age of Onset  . Cervical cancer Mother   . Dementia Mother   . Melanoma Father   . Ulcerative colitis Sister   . Colon cancer Neg Hx   . Esophageal cancer Neg Hx   . Rectal cancer Neg Hx   . Stomach cancer Neg Hx     SOCIAL HISTORY:  Social History   Socioeconomic History  . Marital status: Divorced    Spouse name: Not on file  . Number of children: 2  . Years of education: Not on file  . Highest education level: Not on file  Occupational History  . Not on file  Tobacco Use  . Smoking status: Never Smoker  . Smokeless tobacco: Never Used  Substance and Sexual Activity  . Alcohol use: Yes    Alcohol/week: 0.0 standard drinks    Comment: rarely  . Drug use: No  . Sexual activity: Yes  Other Topics Concern  . Not on file  Social History Narrative  . Not on file   Social Determinants of Health   Financial Resource Strain:   . Difficulty of Paying Living Expenses: Not on file  Food Insecurity:   . Worried About Charity fundraiser in the Last Year: Not on file  . Ran Out of Food in the Last Year: Not on file  Transportation Needs:   . Lack of Transportation (Medical): Not on file  . Lack of Transportation (Non-Medical): Not on file  Physical Activity:   . Days of Exercise per Week: Not on file  . Minutes of Exercise per Session: Not on file  Stress:   . Feeling of Stress : Not on file   Social Connections:   . Frequency of Communication with Friends and Family: Not on file  . Frequency of Social Gatherings with Friends and Family: Not on file  . Attends Religious Services: Not on file  . Active Member of Clubs or Organizations: Not on file  . Attends Archivist Meetings: Not on file  . Marital Status: Not on file  Intimate Partner Violence:   . Fear of Current or Ex-Partner: Not on file  . Emotionally Abused: Not on file  . Physically Abused: Not on file  . Sexually Abused: Not on file     PHYSICAL EXAM  Vitals:   10/19/19 0922  BP: 123/75  Pulse: 76  Temp: (!) 97 F (36.1 C)  Weight: 153 lb (69.4 kg)  Height: '5\' 5"'  (1.651 m)  Body mass index is 25.46 kg/m.   General: The patient is well-developed and well-nourished and in no acute distress  HEENT: Head is normocephalic and atraumatic.  The neck is tender over the left occiput..  Funduscopic examination shows normal optic disks and retinal vessels.  Extremities: There are no rashes and no edema.  Neurologic Exam  Mental status: The patient is alert and oriented x 3 at the time of the examination. The patient has apparent normal recent and remote memory, with an apparently normal attention span and concentration ability.   Speech is normal.  Cranial nerves: Extraocular movements are full.   Facial strength is normal.  Trapezius strength is normal.. No obvious hearing deficits are noted.  Motor:  Muscle bulk is normal.   Tone is normal. Strength is  5 / 5 in all 4 extremities.   Sensory: Temperature and touch sensation was symmetric  Coordination: Cerebellar testing reveals good finger-nose-finger and slightly reduced left heel-to-shin .  Gait and station: Station is normal.   Gait is normal but tandem is wide.   Romberg is negative.  Reflexes: Deep tendon reflexes are symmetric and normal in arms and ankles but increased at knees with  mild crossed adductor responses at knees. .         ASSESSMENT AND PLAN  Other fatigue  Neck pain  Optic neuritis  Gait disturbance   1.   Increase Ritalin to ER 36 mg (Concerta) qAm 2.   Stay active and exercise as tolerated. 3.   If headache/neckpain returns can repeat TPI.Marland Kitchen   4.   If gait or bladder worsens, consider repeat spine imaging  5.   rtc 6 months or call sooner if new or worsening symptoms    Loyd Salvador A. Felecia Shelling, MD, [facility] 3/70/4888, 91:69 AM Certified in Neurology, Clinical Neurophysiology, Sleep Medicine, Pain Medicine and Neuroimaging  [facility] [address] [place], [place] [address] [phone]

## 2019-11-01 DIAGNOSIS — N393 Stress incontinence (female) (male): Secondary | ICD-10-CM | POA: Diagnosis not present

## 2019-11-03 DIAGNOSIS — N898 Other specified noninflammatory disorders of vagina: Secondary | ICD-10-CM | POA: Diagnosis not present

## 2019-11-07 ENCOUNTER — Ambulatory Visit: Payer: 59 | Admitting: Neurology

## 2019-11-08 ENCOUNTER — Telehealth: Payer: Self-pay | Admitting: Neurology

## 2019-11-08 DIAGNOSIS — N310 Uninhibited neuropathic bladder, not elsewhere classified: Secondary | ICD-10-CM | POA: Diagnosis not present

## 2019-11-08 DIAGNOSIS — N393 Stress incontinence (female) (male): Secondary | ICD-10-CM | POA: Diagnosis not present

## 2019-11-08 MED FILL — MYRBETRIQ ER 25 MG TABLET: 25 | 30 days supply | Qty: 30 | Fill #0

## 2019-11-08 NOTE — Telephone Encounter (Signed)
There was no evidence of MS on MRI of the brain and spine from 2019.  I saw her a couple weeks ago and she seemed stable.  If she is having new symptoms we can have her come back in for reevaluation to see if she needs another MRI.

## 2019-11-08 NOTE — Telephone Encounter (Signed)
I called pt back and relayed Dr. Garth Bigness message. She expressed dissatisfaction, used profanity. She feels she does not need to come back in to see Dr. Felecia Shelling since she was just in. Wanted to let Dr. Felecia Shelling know what urologist said and wanted his thoughts on this. I tried to explain that it would be best for her to come back in to speak with him about this, order repeat MRI if needed but she declined. I recommended I call back later after I have spoken again with Dr. Felecia Shelling. She declined.

## 2019-11-08 NOTE — Telephone Encounter (Signed)
Ok. Thank you.

## 2019-11-08 NOTE — Telephone Encounter (Signed)
Dr. Sater- please advise 

## 2019-11-08 NOTE — Telephone Encounter (Signed)
Pt has called asking that Emma,RN be made aware that she recently had some testing done by her Urologist Dr Herricik([facility]) and she was told today there were indications that she may have MS.  Pt states she was not given any kind of print out about this or anything, pt just asked that Terrence Dupont, RN be made aware and to call her

## 2019-11-09 DIAGNOSIS — D2261 Melanocytic nevi of right upper limb, including shoulder: Secondary | ICD-10-CM | POA: Diagnosis not present

## 2019-11-09 DIAGNOSIS — D2271 Melanocytic nevi of right lower limb, including hip: Secondary | ICD-10-CM | POA: Diagnosis not present

## 2019-11-09 DIAGNOSIS — D3612 Benign neoplasm of peripheral nerves and autonomic nervous system, upper limb, including shoulder: Secondary | ICD-10-CM | POA: Diagnosis not present

## 2019-11-09 DIAGNOSIS — L821 Other seborrheic keratosis: Secondary | ICD-10-CM | POA: Diagnosis not present

## 2019-11-09 DIAGNOSIS — Z85828 Personal history of other malignant neoplasm of skin: Secondary | ICD-10-CM | POA: Diagnosis not present

## 2019-11-09 DIAGNOSIS — D2262 Melanocytic nevi of left upper limb, including shoulder: Secondary | ICD-10-CM | POA: Diagnosis not present

## 2019-11-09 DIAGNOSIS — D1801 Hemangioma of skin and subcutaneous tissue: Secondary | ICD-10-CM | POA: Diagnosis not present

## 2019-11-09 DIAGNOSIS — D225 Melanocytic nevi of trunk: Secondary | ICD-10-CM | POA: Diagnosis not present

## 2019-11-09 DIAGNOSIS — D2272 Melanocytic nevi of left lower limb, including hip: Secondary | ICD-10-CM | POA: Diagnosis not present

## 2019-11-09 MED FILL — SOOLANTRA 1% CREAM: 1 | 30 days supply | Qty: 45 | Fill #0

## 2019-11-11 NOTE — Telephone Encounter (Signed)
Pt has called asking to speak with Terrence Dupont, RN.  Pt states she was calling to apologize to Terrence Dupont, [place].  For the way in which she spoke to her on Tuesday, please give pt a all on Monday when available.

## 2019-11-14 NOTE — Telephone Encounter (Signed)
Called, LVM letting pt know I received her message. Advised per Dr. Felecia Shelling that we can refer her to [facility] or [facility] for a second opinion. We can place referral. Asked her to call back to let us know if she is agreeable to this and which location she would prefer

## 2019-11-15 ENCOUNTER — Telehealth: Payer: Self-pay | Admitting: Neurology

## 2019-11-15 NOTE — Telephone Encounter (Signed)
I spoke to [name] [name].   I discussed with her that I do not have a good explanation for some of her symptoms including the urinary retention.  Although MS could certainly cause symptoms like that her brain MRI and spine MRIs have been normal and I do not think that she has MS.  I would be happy to refer her if she would like another opinion on this matter.  She apologized for being rude to our staff a couple days ago and would like to continue to be seen here.

## 2019-11-28 ENCOUNTER — Other Ambulatory Visit: Payer: Self-pay | Admitting: Neurology

## 2019-11-28 MED FILL — ESTRADIOL 0.05 MG/24HR PTTW: 0.05 | 84 days supply | Qty: 24 | Fill #0

## 2019-11-28 MED FILL — CONCERTA 36 MG TABLET ER: 36 | 30 days supply | Qty: 30 | Fill #0

## 2019-12-12 DIAGNOSIS — M6289 Other specified disorders of muscle: Secondary | ICD-10-CM | POA: Diagnosis not present

## 2019-12-12 DIAGNOSIS — M62838 Other muscle spasm: Secondary | ICD-10-CM | POA: Diagnosis not present

## 2019-12-12 DIAGNOSIS — N393 Stress incontinence (female) (male): Secondary | ICD-10-CM | POA: Diagnosis not present

## 2019-12-12 DIAGNOSIS — M6281 Muscle weakness (generalized): Secondary | ICD-10-CM | POA: Diagnosis not present

## 2019-12-23 MED FILL — ACYCLOVIR 200 MG CAP: 200 | 10 days supply | Qty: 60 | Fill #1

## 2019-12-30 DIAGNOSIS — N393 Stress incontinence (female) (male): Secondary | ICD-10-CM | POA: Diagnosis not present

## 2019-12-30 DIAGNOSIS — M6289 Other specified disorders of muscle: Secondary | ICD-10-CM | POA: Diagnosis not present

## 2019-12-30 DIAGNOSIS — M6281 Muscle weakness (generalized): Secondary | ICD-10-CM | POA: Diagnosis not present

## 2019-12-30 DIAGNOSIS — M62838 Other muscle spasm: Secondary | ICD-10-CM | POA: Diagnosis not present

## 2020-01-03 DIAGNOSIS — R109 Unspecified abdominal pain: Secondary | ICD-10-CM | POA: Diagnosis not present

## 2020-01-03 DIAGNOSIS — R1084 Generalized abdominal pain: Secondary | ICD-10-CM | POA: Diagnosis not present

## 2020-01-03 DIAGNOSIS — R1032 Left lower quadrant pain: Secondary | ICD-10-CM | POA: Diagnosis not present

## 2020-01-09 DIAGNOSIS — Z1231 Encounter for screening mammogram for malignant neoplasm of breast: Secondary | ICD-10-CM | POA: Diagnosis not present

## 2020-01-12 ENCOUNTER — Other Ambulatory Visit (HOSPITAL_COMMUNITY): Payer: Self-pay | Admitting: Obstetrics and Gynecology

## 2020-01-12 MED FILL — INTRAROSA 6.5 MG INST: 6.5 | 28 days supply | Qty: 28 | Fill #0

## 2020-01-18 ENCOUNTER — Other Ambulatory Visit: Payer: Self-pay | Admitting: Neurology

## 2020-02-10 DIAGNOSIS — N393 Stress incontinence (female) (male): Secondary | ICD-10-CM | POA: Diagnosis not present

## 2020-02-10 DIAGNOSIS — N310 Uninhibited neuropathic bladder, not elsewhere classified: Secondary | ICD-10-CM | POA: Diagnosis not present

## 2020-02-21 DIAGNOSIS — Z23 Encounter for immunization: Secondary | ICD-10-CM | POA: Diagnosis not present

## 2020-02-21 DIAGNOSIS — E7800 Pure hypercholesterolemia, unspecified: Secondary | ICD-10-CM | POA: Diagnosis not present

## 2020-02-21 DIAGNOSIS — Z0000 Encounter for general adult medical examination without abnormal findings: Secondary | ICD-10-CM | POA: Diagnosis not present

## 2020-02-21 DIAGNOSIS — G379 Demyelinating disease of central nervous system, unspecified: Secondary | ICD-10-CM | POA: Diagnosis not present

## 2020-02-21 DIAGNOSIS — E559 Vitamin D deficiency, unspecified: Secondary | ICD-10-CM | POA: Diagnosis not present

## 2020-02-21 DIAGNOSIS — N390 Urinary tract infection, site not specified: Secondary | ICD-10-CM | POA: Diagnosis not present

## 2020-02-21 DIAGNOSIS — R5383 Other fatigue: Secondary | ICD-10-CM | POA: Diagnosis not present

## 2020-02-22 ENCOUNTER — Other Ambulatory Visit (HOSPITAL_COMMUNITY): Payer: Self-pay | Admitting: Dermatology

## 2020-02-22 MED FILL — SPIRONOLACTONE 50 MG TABS: 50 | 60 days supply | Qty: 60 | Fill #0

## 2020-03-07 ENCOUNTER — Other Ambulatory Visit: Payer: Self-pay | Admitting: Neurology

## 2020-03-08 MED FILL — CONCERTA 36 MG TABLET ER: 36 | 30 days supply | Qty: 30 | Fill #0

## 2020-03-19 DIAGNOSIS — R350 Frequency of micturition: Secondary | ICD-10-CM | POA: Diagnosis not present

## 2020-03-19 DIAGNOSIS — N390 Urinary tract infection, site not specified: Secondary | ICD-10-CM | POA: Diagnosis not present

## 2020-03-19 MED FILL — NITROFURANTOIN MONO-MCR 100: 100 | 5 days supply | Qty: 10 | Fill #0

## 2020-03-20 DIAGNOSIS — H5201 Hypermetropia, right eye: Secondary | ICD-10-CM | POA: Diagnosis not present

## 2020-03-20 MED FILL — INTRAROSA 6.5 MG INST: 6.5 | 28 days supply | Qty: 28 | Fill #0

## 2020-04-18 ENCOUNTER — Encounter: Payer: Self-pay | Admitting: Neurology

## 2020-04-18 ENCOUNTER — Telehealth: Payer: Self-pay | Admitting: *Deleted

## 2020-04-18 ENCOUNTER — Telehealth (INDEPENDENT_AMBULATORY_CARE_PROVIDER_SITE_OTHER): Payer: 59 | Admitting: Neurology

## 2020-04-18 ENCOUNTER — Telehealth: Payer: Self-pay | Admitting: Neurology

## 2020-04-18 DIAGNOSIS — R5383 Other fatigue: Secondary | ICD-10-CM

## 2020-04-18 DIAGNOSIS — H469 Unspecified optic neuritis: Secondary | ICD-10-CM

## 2020-04-18 DIAGNOSIS — M542 Cervicalgia: Secondary | ICD-10-CM

## 2020-04-18 DIAGNOSIS — R200 Anesthesia of skin: Secondary | ICD-10-CM | POA: Diagnosis not present

## 2020-04-18 DIAGNOSIS — R269 Unspecified abnormalities of gait and mobility: Secondary | ICD-10-CM

## 2020-04-18 MED ORDER — METHYLPHENIDATE HCL ER (OSM) 36 MG PO TBCR: 36.0000 mg | EXTENDED_RELEASE_TABLET | Freq: Every day | ORAL | 0 refills | Status: DC

## 2020-04-18 MED FILL — CONCERTA 36 MG TABLET ER: 36 | 30 days supply | Qty: 30 | Fill #0

## 2020-04-18 NOTE — Telephone Encounter (Signed)
Took call from phone staff. Pt has headache/runny nose. Converted appt to mychart for this morning. Updated med list, pharmacy, allergy list on file. Pt aware to log on 10-15 min prior to appt time.

## 2020-04-18 NOTE — Telephone Encounter (Signed)
..   Pt understands that although there may be some limitations with this type of visit, we will take all precautions to reduce any security or privacy concerns.  Pt understands that this will be treated like an in office visit and we will file with pt's insurance, and there may be a patient responsible charge related to this service. ? ?

## 2020-04-18 NOTE — Progress Notes (Signed)
[facility]  PATIENT: [name] [name] DOB: 1971/07/14  REFERRING DOCTOR OR PCP:  Dr. Inda Merlin SOURCE: Notes from PCP and Dr. Nigel Bridgeman, imaging lab reports, MRI images on PACS.  _________________________________   HISTORICAL  CHIEF COMPLAINT:  No chief complaint on file.   HISTORY OF PRESENT ILLNESS:  [name] [name] is a 49 y.o. woman with a history of optic neuritis and who has undergone evaluation for possible multiple sclerosis.  Update 04/18/2020: Virtual Visit via Video Note I connected with Valrie Hart on 04/18/20 at 10:00 AM EDT by a video enabled telemedicine application and verified that I am speaking with the correct person.  I discussed the limitations of evaluation and management by telemedicine and the availability of in person appointments. The patient expressed understanding and agreed to proceed.  Patient was at her work and provider was in the office  History of Present Illness: She is generally doing well.    She has noted a headache and some congestion this week.  She has no   No visual problems.    She had optic neuritis 11 years ago. She also has had urinary incontinence.  She was having some numbness and gait issues so she had  MRI of the brain and spine 06/23/2018.  There was no evidence of demyelination or myelopathy.  She has mild spinal stenosis at [facility]  She feels better on Concerta and get through the day well and still feels improved in the early evening with family.    She is sleeping 8-9 hours and feels refreshed when she wakes up.   She had mild ADD and Concerta has helped  Observations/Objective:  She is a well-developed well-nourished woman in no acute distress.  The head is normocephalic and atraumatic.  Sclera are anicteric.  Visible skin appears normal.  The neck has a good range of motion.   She is alert and fully oriented with fluent speech and good attention, knowledge and memory.  Extraocular muscles are intact.  Facial  strength is normal.  .  She appears to have normal strength in the arms.  Rapid alternating movements and finger-nose-finger are performed well.   Optic neuritis history:  In 2009, she lost vision in her left eye.   She had a black central dot and blurriness around that.    She felt symptoms resolved within an hour but was back the next day.    She had a similar recurrence the next day.    She saw ophthalmology (Dr. Zadie Rhine) and was diagnosed with optic neuritis.    MRI's of the brain and cervical spine were normal.     She had numbness in her face shortly later.    She was referred to Dr. Hart Robinsons and then another doctor at [facility].    MS was not confirmed.   She saw Dr. Jacqulynn Cadet who felt she probably had MS but did not start a DMT.    She then had an episode of left leg numbness and left foot drop that comes and goes for a day or two and still does a couple times a year.    She also has a lot of fatigue (physical > cognitive).   When this occurs, she is more likely to have an episode involving the left leg.   She has had a couple more episodes of blurry vision but none the past year.   She has had steroids (starting with 200 mg and tapering down over a week or so) about  twice a year.   The last time was summer 2018.  Some episodes are associated with her gait worsening and even her speech being off.     She always feels better for a few months afterwards.     She is on Ritalin 10 mg po bid and that usually helps her fatigue.  She sleeps well most every night.   She denies any major problems with mood.    Studies:  She was initially evaluated by Dr. Erling Cruz and had MRI studies, evoked potential studies CSF analysis (no oligoclonal bands and IgG index was normal) and blood work including NMO, anti-cardiolipin antibody, ANA, Ace, RPR, ESR, B12, Lyme. The blood work was normal or negative.  MRIs of the brain dated 05/10/2017 month 08/14/2014 and 05/10/2008 an MRI of the cervical spine from 12/16/2016 and MRI  the thoracic spine from 08/16/2008. There were no demyelinating lesions in any of the MRIs  REVIEW OF SYSTEMS: Constitutional: No fevers, chills, sweats, or change in appetite.   Notes fatigue Eyes: As above Ear, nose and throat: No hearing loss, ear pain, nasal congestion, sore throat Cardiovascular: No chest pain, palpitations Respiratory: No shortness of breath at rest or with exertion.   No wheezes GastrointestinaI: No nausea, vomiting, diarrhea, abdominal pain.  She had one episode of fecal incontinence. Genitourinary: No dysuria, urinary retention or frequency.  No nocturia. Musculoskeletal: No neck pain, back pain Integumentary: No rash, pruritus, skin lesions Neurological: as above Psychiatric: No depression at this time.  No anxiety Endocrine: No palpitations, diaphoresis, change in appetite, change in weigh or increased thirst Hematologic/Lymphatic: No anemia, purpura, petechiae. Allergic/Immunologic: No itchy/runny eyes, nasal congestion, recent allergic reactions, rashes  ALLERGIES: Allergies  Allergen Reactions  . Venlafaxine Hives    blisters  . Augmentin [Amoxicillin-Pot Clavulanate] Rash    tongue swelling Has patient had a PCN reaction causing immediate rash, facial/tongue/throat swelling, SOB or lightheadedness with hypotension: No Has patient had a PCN reaction causing severe rash involving mucus membranes or skin necrosis: No Has patient had a PCN reaction that required hospitalization: No Has patient had a PCN reaction occurring within the last 10 years: No If all of the above answers are "NO", then may proceed with Cephalosporin use.   . Demerol [Meperidine] Swelling and Rash    Tongue swelling  . Estrogens Rash    The patient can use patch,No oral  . Gabapentin Rash  . Stadol [Butorphanol] Swelling and Rash    Tongue swelling    HOME MEDICATIONS:  Current Outpatient Medications:  .  cetirizine (ZYRTEC) 10 MG tablet, Take 10 mg by mouth at bedtime.  , Disp: , Rfl:  .  estradiol (MINIVELLE) 0.1 MG/24HR patch, Place 1 patch (0.1 mg total) onto the skin 2 (two) times a week., Disp: 8 patch, Rfl: 12 .  methylphenidate (CONCERTA) 36 MG PO CR tablet, Take 1 tablet (36 mg total) by mouth daily., Disp: 30 tablet, Rfl: 0  PAST MEDICAL HISTORY: Past Medical History:  Diagnosis Date  . Allergy   . Anxiety   . Cancer ([place])    basal cell skin ca  . Depression   . Family history of adverse reaction to anesthesia    sister woke up during surgery age 4's for tonsillectomy  . Multiple sclerosis ([place])   . Neuromuscular disorder ([place])    multiple sclerosis  . Vision abnormalities     PAST SURGICAL HISTORY: Past Surgical History:  Procedure Laterality Date  . adnoidectomy    . BUNIONECTOMY  Left    foot  . CESAREAN SECTION    . CHOLECYSTECTOMY    . ROBOTIC ASSISTED LAPAROSCOPIC HYSTERECTOMY AND SALPINGECTOMY N/A 01/19/2018   Procedure: XI ROBOTIC ASSISTED LAPAROSCOPIC HYSTERECTOMY AND BILATERAL SALPINGECTOMY, LEFT OVARIAN CYSTECTOMY;  Surgeon: Everitt Amber, MD;  Location: [facility];  Service: Gynecology;  Laterality: N/A;  . wisdom teeth exraction      FAMILY HISTORY: Family History  Problem Relation Age of Onset  . Cervical cancer Mother   . Dementia Mother   . Melanoma Father   . Ulcerative colitis Sister   . Colon cancer Neg Hx   . Esophageal cancer Neg Hx   . Rectal cancer Neg Hx   . Stomach cancer Neg Hx         ASSESSMENT AND PLAN  Optic neuritis  Other fatigue  Gait disturbance  Neck pain  Numbness   1.   Continue Concerta 36 mg qAm 2.   Stay active and exercise as tolerated. 3.   Currently headaches and neck pain are not too bad.  If headache/neckpain returns can repeat TPI.Marland Kitchen   4.    rtc 6 months or call sooner if new or worsening symptoms   Follow Up Instructions: I discussed the assessment and treatment plan with the patient. The patient was provided an opportunity to ask questions and all were answered.  The patient agreed with the plan and demonstrated an understanding of the instructions.    The patient was advised to call back or seek an in-person evaluation if the symptoms worsen or if the condition fails to improve as anticipated.  I provided 25 minutes of non-face-to-face time during this encounter.    Areeb Corron A. Felecia Shelling, MD, [facility] 9/70/2637, 8:58 PM Certified in Neurology, Clinical Neurophysiology, Sleep Medicine, Pain Medicine and Neuroimaging  [facility] [address] [place], [place] [address] [phone]

## 2020-05-02 MED FILL — CYCLOBENZAPRINE HCL 10 MG T: 10 | 30 days supply | Qty: 30 | Fill #0

## 2020-05-11 DIAGNOSIS — R05 Cough: Secondary | ICD-10-CM | POA: Diagnosis not present

## 2020-05-17 MED FILL — ESTRADIOL 0.05 MG/24HR PTTW: 0.05 | 84 days supply | Qty: 24 | Fill #2

## 2020-06-11 MED FILL — INTRAROSA 6.5 MG INST: 6.5 | 28 days supply | Qty: 28 | Fill #1

## 2020-06-11 MED FILL — SPIRONOLACTONE 50 MG TABLET: 50 | 60 days supply | Qty: 60 | Fill #1

## 2020-06-15 ENCOUNTER — Other Ambulatory Visit (HOSPITAL_COMMUNITY): Payer: Self-pay | Admitting: Internal Medicine

## 2020-06-15 DIAGNOSIS — L237 Allergic contact dermatitis due to plants, except food: Secondary | ICD-10-CM | POA: Diagnosis not present

## 2020-06-15 MED FILL — HYDROXYZINE HCL 25 MG TABS: 25 | 10 days supply | Qty: 30 | Fill #0

## 2020-06-15 MED FILL — predniSONE 10 MG (21) TBPK: 10 | 5 days supply | Qty: 21 | Fill #0

## 2020-06-19 ENCOUNTER — Other Ambulatory Visit: Payer: Self-pay | Admitting: Neurology

## 2020-06-19 MED FILL — CONCERTA 36 MG TABLET ER: 36 | 30 days supply | Qty: 30 | Fill #0

## 2020-06-27 ENCOUNTER — Other Ambulatory Visit (HOSPITAL_COMMUNITY): Payer: Self-pay | Admitting: Dermatology

## 2020-06-27 DIAGNOSIS — Z85828 Personal history of other malignant neoplasm of skin: Secondary | ICD-10-CM | POA: Diagnosis not present

## 2020-06-27 DIAGNOSIS — L239 Allergic contact dermatitis, unspecified cause: Secondary | ICD-10-CM | POA: Diagnosis not present

## 2020-06-27 MED FILL — HYDROXYZINE HCL 25 MG TABS: 25 | 25 days supply | Qty: 50 | Fill #0

## 2020-06-27 MED FILL — predniSONE 10 MG TABS: 10 | 12 days supply | Qty: 42 | Fill #0

## 2020-06-27 MED FILL — TRIAMCINOLONE 0.1% CREAM: 0.1 | 30 days supply | Qty: 454 | Fill #0

## 2020-07-24 ENCOUNTER — Other Ambulatory Visit (HOSPITAL_COMMUNITY): Payer: Self-pay | Admitting: Obstetrics and Gynecology

## 2020-07-24 ENCOUNTER — Other Ambulatory Visit: Payer: Self-pay | Admitting: Neurology

## 2020-07-24 MED FILL — CONCERTA 36 MG TABLET ER: 36 | 30 days supply | Qty: 30 | Fill #0

## 2020-07-24 MED FILL — ESTRADIOL 0.05 MG/24HR PTTW: 0.05 | 84 days supply | Qty: 24 | Fill #0

## 2020-07-30 ENCOUNTER — Other Ambulatory Visit (HOSPITAL_COMMUNITY): Payer: Self-pay | Admitting: Dermatology

## 2020-08-01 MED FILL — TRETINOIN 0.025% CREAM: 0.025 | 30 days supply | Qty: 20 | Fill #0

## 2020-08-06 DIAGNOSIS — Z01419 Encounter for gynecological examination (general) (routine) without abnormal findings: Secondary | ICD-10-CM | POA: Diagnosis not present

## 2020-08-06 DIAGNOSIS — Z113 Encounter for screening for infections with a predominantly sexual mode of transmission: Secondary | ICD-10-CM | POA: Diagnosis not present

## 2020-08-06 DIAGNOSIS — Z6825 Body mass index (BMI) 25.0-25.9, adult: Secondary | ICD-10-CM | POA: Diagnosis not present

## 2020-08-16 ENCOUNTER — Inpatient Hospital Stay: Payer: 59 | Attending: Internal Medicine

## 2020-08-16 ENCOUNTER — Other Ambulatory Visit: Payer: Self-pay

## 2020-08-16 DIAGNOSIS — Z23 Encounter for immunization: Secondary | ICD-10-CM | POA: Insufficient documentation

## 2020-09-04 MED FILL — SPIRONOLACTONE 50 MG TABLET: 50 | 60 days supply | Qty: 60 | Fill #2

## 2020-09-25 DIAGNOSIS — H0264 Xanthelasma of left upper eyelid: Secondary | ICD-10-CM | POA: Diagnosis not present

## 2020-09-25 DIAGNOSIS — Z85828 Personal history of other malignant neoplasm of skin: Secondary | ICD-10-CM | POA: Diagnosis not present

## 2020-09-25 DIAGNOSIS — H0261 Xanthelasma of right upper eyelid: Secondary | ICD-10-CM | POA: Diagnosis not present

## 2020-09-25 DIAGNOSIS — Z79899 Other long term (current) drug therapy: Secondary | ICD-10-CM | POA: Diagnosis not present

## 2020-09-30 DIAGNOSIS — N3000 Acute cystitis without hematuria: Secondary | ICD-10-CM | POA: Diagnosis not present

## 2020-10-01 ENCOUNTER — Telehealth: Payer: Self-pay | Admitting: Neurology

## 2020-10-01 NOTE — Telephone Encounter (Signed)
lvm and mychart messgae for pt to call back and r/s

## 2020-10-08 DIAGNOSIS — G379 Demyelinating disease of central nervous system, unspecified: Secondary | ICD-10-CM | POA: Diagnosis not present

## 2020-10-08 DIAGNOSIS — H0260 Xanthelasma of unspecified eye, unspecified eyelid: Secondary | ICD-10-CM | POA: Diagnosis not present

## 2020-10-08 DIAGNOSIS — E7800 Pure hypercholesterolemia, unspecified: Secondary | ICD-10-CM | POA: Diagnosis not present

## 2020-10-18 DIAGNOSIS — H0264 Xanthelasma of left upper eyelid: Secondary | ICD-10-CM | POA: Diagnosis not present

## 2020-10-18 DIAGNOSIS — H0261 Xanthelasma of right upper eyelid: Secondary | ICD-10-CM | POA: Diagnosis not present

## 2020-10-18 DIAGNOSIS — Z85828 Personal history of other malignant neoplasm of skin: Secondary | ICD-10-CM | POA: Diagnosis not present

## 2020-10-19 ENCOUNTER — Other Ambulatory Visit: Payer: Self-pay | Admitting: Neurology

## 2020-10-19 MED FILL — INTRAROSA 6.5 MG INST: 6.5 | 28 days supply | Qty: 28 | Fill #2

## 2020-10-22 ENCOUNTER — Other Ambulatory Visit: Payer: Self-pay | Admitting: Neurology

## 2020-10-23 MED FILL — CONCERTA 36 MG TABLET ER: 36 | 30 days supply | Qty: 30 | Fill #0

## 2020-10-31 ENCOUNTER — Ambulatory Visit: Payer: 59 | Admitting: Neurology

## 2020-11-08 ENCOUNTER — Other Ambulatory Visit (HOSPITAL_COMMUNITY): Payer: Self-pay | Admitting: Obstetrics and Gynecology

## 2020-11-08 MED FILL — ESTRADIOL 0.05 MG/24HR PTTW: 0.05 | 84 days supply | Qty: 24 | Fill #0

## 2020-12-14 ENCOUNTER — Other Ambulatory Visit (HOSPITAL_COMMUNITY): Payer: Self-pay

## 2020-12-14 ENCOUNTER — Other Ambulatory Visit: Payer: Self-pay | Admitting: Neurology

## 2020-12-14 MED ORDER — CYCLOBENZAPRINE HCL 10 MG PO TABS
10.0000 mg | ORAL_TABLET | Freq: Every evening | ORAL | 0 refills | Status: DC | PRN
  Filled 2020-12-14: qty 30, 30d supply, fill #0

## 2020-12-15 ENCOUNTER — Other Ambulatory Visit (HOSPITAL_COMMUNITY): Payer: Self-pay

## 2020-12-17 ENCOUNTER — Other Ambulatory Visit (HOSPITAL_COMMUNITY): Payer: Self-pay

## 2020-12-17 MED ORDER — METHYLPHENIDATE HCL ER (OSM) 36 MG PO TBCR
EXTENDED_RELEASE_TABLET | Freq: Every day | ORAL | 0 refills | Status: DC
  Filled 2020-12-17: qty 30, 30d supply, fill #0

## 2020-12-19 ENCOUNTER — Other Ambulatory Visit (HOSPITAL_COMMUNITY): Payer: Self-pay

## 2020-12-19 MED ORDER — SPIRONOLACTONE 50 MG PO TABS
ORAL_TABLET | ORAL | 2 refills | Status: DC
  Filled 2020-12-19: qty 90, 90d supply, fill #0
  Filled 2021-06-18: qty 90, 90d supply, fill #1
  Filled 2021-11-13: qty 90, 90d supply, fill #2

## 2020-12-20 ENCOUNTER — Other Ambulatory Visit (HOSPITAL_COMMUNITY): Payer: Self-pay

## 2020-12-24 ENCOUNTER — Ambulatory Visit: Payer: 59 | Admitting: Neurology

## 2021-01-04 ENCOUNTER — Ambulatory Visit: Payer: 59 | Admitting: Neurology

## 2021-01-04 ENCOUNTER — Other Ambulatory Visit (HOSPITAL_COMMUNITY): Payer: Self-pay

## 2021-01-04 ENCOUNTER — Other Ambulatory Visit: Payer: Self-pay

## 2021-01-04 ENCOUNTER — Encounter: Payer: Self-pay | Admitting: Neurology

## 2021-01-04 VITALS — BP 116/79 | HR 67 | Ht 64.0 in | Wt 147.0 lb

## 2021-01-04 DIAGNOSIS — R5383 Other fatigue: Secondary | ICD-10-CM

## 2021-01-04 DIAGNOSIS — H469 Unspecified optic neuritis: Secondary | ICD-10-CM | POA: Diagnosis not present

## 2021-01-04 DIAGNOSIS — F988 Other specified behavioral and emotional disorders with onset usually occurring in childhood and adolescence: Secondary | ICD-10-CM | POA: Insufficient documentation

## 2021-01-04 DIAGNOSIS — H5712 Ocular pain, left eye: Secondary | ICD-10-CM

## 2021-01-04 MED ORDER — METHYLPHENIDATE HCL ER (OSM) 36 MG PO TBCR
36.0000 mg | EXTENDED_RELEASE_TABLET | Freq: Every day | ORAL | 0 refills | Status: DC
  Filled 2021-01-04 – 2021-02-05 (×2): qty 30, 30d supply, fill #0

## 2021-01-04 NOTE — Progress Notes (Signed)
[facility]  PATIENT: [name] [name] DOB: 09/06/1971  REFERRING DOCTOR OR PCP:  Dr. Inda Merlin SOURCE: Notes from PCP and Dr. Nigel Bridgeman, imaging lab reports, MRI images on PACS.  _________________________________   HISTORICAL  CHIEF COMPLAINT:  Chief Complaint  Patient presents with  . Follow-up    Routine Visit Room 12, alone in room    HISTORY OF PRESENT ILLNESS:  [name] [name] is a 50 y.o. woman with a history of optic neuritis and who has undergone evaluation for possible multiple sclerosis.  Update 01/04/2021:  She is generally doing well.    No visual problems but she notes mild left eye pain at times (has been off/on since the ON).    She had left optic neuritis in 2009. She also has had urinary incontinence but that is doing well now.  She occasionally has numbness when she gets a massage but not at other times.  Gait is fine.      MRI of the brain and spine 06/23/2018.  There was no evidence of demyelination or myelopathy.  She has mild spinal stenosis at [facility]  ADD is doing well on Concerta 36 mg .  She feels the medciation holds her throughout the day.   She does not have insomnia      She is sleeping 8-9 hours and feels refreshed when she wakes up.   She was diagnosed with ADD as a child but did not receive medications.      Optic neuritis history:  In 2009, she lost vision in her left eye.   She had a black central dot and blurriness around that.    She felt symptoms resolved within an hour but was back the next day.    She had a similar recurrence the next day.    She saw ophthalmology (Dr. Zadie Rhine) and was diagnosed with optic neuritis.    MRI's of the brain and cervical spine were normal.     She had numbness in her face shortly later.    She was referred to Dr. Hart Robinsons and then another doctor at [facility]    MS was not confirmed.   She saw Dr. Jacqulynn Cadet who felt she probably had MS but did not start a DMT.    She then had an episode of left leg numbness  and left foot drop that comes and goes for a day or two and still does a couple times a year.    She also has a lot of fatigue (physical > cognitive).   When this occurs, she is more likely to have an episode involving the left leg.   She has had a couple more episodes of blurry vision but none the past year.   She has had steroids (starting with 200 mg and tapering down over a week or so) about twice a year.   The last time was summer 2018.  Some episodes are associated with her gait worsening and even her speech being off.     She always feels better for a few months afterwards.     She is on Ritalin 10 mg po bid and that usually helps her fatigue.  She sleeps well most every night.   She denies any major problems with mood.    Studies:  She was initially evaluated by Dr. Erling Cruz and had MRI studies, evoked potential studies CSF analysis (no oligoclonal bands and IgG index was normal) and blood work including NMO, anti-cardiolipin antibody, ANA, Ace, RPR, ESR, B12, Lyme.  The blood work was normal or negative.  MRI of the brain and spine 06/23/2018:  There was no evidence of demyelination or myelopathy.  She has mild spinal stenosis at C5C6  MRIs of the brain dated 05/10/2017 month 08/14/2014 and 05/10/2008 an MRI of the cervical spine from 12/16/2016 and MRI the thoracic spine from 08/16/2008. There were no demyelinating lesions in any of the MRIs  REVIEW OF SYSTEMS: Constitutional: No fevers, chills, sweats, or change in appetite.   Notes fatigue Eyes: As above Ear, nose and throat: No hearing loss, ear pain, nasal congestion, sore throat Cardiovascular: No chest pain, palpitations Respiratory: No shortness of breath at rest or with exertion.   No wheezes GastrointestinaI: No nausea, vomiting, diarrhea, abdominal pain.  She had one episode of fecal incontinence. Genitourinary: No dysuria, urinary retention or frequency.  No nocturia. Musculoskeletal: No neck pain, back pain Integumentary: No  rash, pruritus, skin lesions Neurological: as above Psychiatric: No depression at this time.  No anxiety Endocrine: No palpitations, diaphoresis, change in appetite, change in weigh or increased thirst Hematologic/Lymphatic: No anemia, purpura, petechiae. Allergic/Immunologic: No itchy/runny eyes, nasal congestion, recent allergic reactions, rashes  ALLERGIES: Allergies  Allergen Reactions  . Venlafaxine Hives    blisters  . Augmentin [Amoxicillin-Pot Clavulanate] Rash    tongue swelling Has patient had a PCN reaction causing immediate rash, facial/tongue/throat swelling, SOB or lightheadedness with hypotension: No Has patient had a PCN reaction causing severe rash involving mucus membranes or skin necrosis: No Has patient had a PCN reaction that required hospitalization: No Has patient had a PCN reaction occurring within the last 10 years: No If all of the above answers are "NO", then may proceed with Cephalosporin use.   . Demerol [Meperidine] Swelling and Rash    Tongue swelling  . Estrogens Rash    The patient can use patch,No oral  . Gabapentin Rash  . Stadol [Butorphanol] Swelling and Rash    Tongue swelling    HOME MEDICATIONS:  Current Outpatient Medications:  .  cetirizine (ZYRTEC) 10 MG tablet, Take 10 mg by mouth at bedtime. , Disp: , Rfl:  .  cyclobenzaprine (FLEXERIL) 10 MG tablet, Take 1 tablet (10 mg total) by mouth at bedtime as needed for muscle spasms, Disp: 30 tablet, Rfl: 0 .  estradiol (MINIVELLE) 0.1 MG/24HR patch, Place 1 patch (0.1 mg total) onto the skin 2 (two) times a week., Disp: 8 patch, Rfl: 12 .  estradiol (VIVELLE-DOT) 0.05 MG/24HR patch, APPLY 1 PATCH TO SKIN TWICE A WEEK., Disp: 24 patch, Rfl: 3 .  estradiol (VIVELLE-DOT) 0.05 MG/24HR patch, APPLY 1 PATCH TO SKIN TWICE A WEEK., Disp: 24 patch, Rfl: 0 .  hydrOXYzine (ATARAX/VISTARIL) 25 MG tablet, TAKE UP TO 2 TABLETS BY MOUTH DAILY AT BEDTIME AS NEEDED FOR ITCHING, Disp: 50 tablet, Rfl: 1 .   hydrOXYzine (ATARAX/VISTARIL) 25 MG tablet, TAKE 1 TABLET BY MOUTH EVERY 8 HOURS AS NEEDED FOR 10 DAYS, Disp: 30 tablet, Rfl: 0 .  methylphenidate (CONCERTA) 36 MG PO CR tablet, Take 1 tablet (36 mg total) by mouth daily., Disp: 30 tablet, Rfl: 0 .  Prasterone 6.5 MG INST, INSERT 1 VAGINAL INSERT EVERY DAY AS DIRECTED, Disp: 28 each, Rfl: 5 .  predniSONE (DELTASONE) 10 MG tablet, TAKE 1 TABLET BY MOUTH AS DIRECTED PER WRITTEN INSTRUCTIONS FROM PRESCRIBER, Disp: 42 tablet, Rfl: 0 .  predniSONE (STERAPRED UNI-PAK 21 TAB) 10 MG (21) TBPK tablet, TAKE ALL 6 TABLETS BY MOUTH ON DAY 1, THEN DECREASE BY ONE  TABLET EACH DAY (6-5-4-3-2-1), Disp: 21 each, Rfl: 0 .  spironolactone (ALDACTONE) 50 MG tablet, TAKE 1 TABLET BY MOUTH ONCE DAILY, Disp: 60 tablet, Rfl: 2 .  spironolactone (ALDACTONE) 50 MG tablet, TAKE 1 TABLET BY MOUTH ONCE DAILY, Disp: 90 tablet, Rfl: 2 .  tretinoin (RETIN-A) 0.025 % cream, APPLY PEA SIZED AMOUNT EACH NIGHT TO CLEAN DRY FACE., Disp: 20 g, Rfl: 2 .  triamcinolone (KENALOG) 0.1 %, APPLY TO THE AFFECTED AREA(S) OF SKIN AS DIRECTED, Disp: 454 g, Rfl: 0  PAST MEDICAL HISTORY: Past Medical History:  Diagnosis Date  . Allergy   . Anxiety   . Cancer ([place])    basal cell skin ca  . Depression   . Family history of adverse reaction to anesthesia    sister woke up during surgery age 91's for tonsillectomy  . Multiple sclerosis ([place])   . Neuromuscular disorder ([place])    multiple sclerosis  . Vision abnormalities     PAST SURGICAL HISTORY: Past Surgical History:  Procedure Laterality Date  . adnoidectomy    . BUNIONECTOMY Left    foot  . CESAREAN SECTION    . CHOLECYSTECTOMY    . ROBOTIC ASSISTED LAPAROSCOPIC HYSTERECTOMY AND SALPINGECTOMY N/A 01/19/2018   Procedure: XI ROBOTIC ASSISTED LAPAROSCOPIC HYSTERECTOMY AND BILATERAL SALPINGECTOMY, LEFT OVARIAN CYSTECTOMY;  Surgeon: Everitt Amber, MD;  Location: [facility];  Service: Gynecology;  Laterality: N/A;  . wisdom teeth exraction       FAMILY HISTORY: Family History  Problem Relation Age of Onset  . Cervical cancer Mother   . Dementia Mother   . Melanoma Father   . Ulcerative colitis Sister   . Colon cancer Neg Hx   . Esophageal cancer Neg Hx   . Rectal cancer Neg Hx   . Stomach cancer Neg Hx     PHYSICAL EXAM  Today's Vitals   01/04/21 0911  BP: 116/79  Pulse: 67  Weight: 147 lb (66.7 kg)  Height: '5\' 4"'  (1.626 m)   Body mass index is 25.23 kg/m.   General: The patient is well-developed and well-nourished and in no acute distress  HEENT: Head is normocephalic and atraumatic.  Funduscopic examination shows normal optic disks and retinal vessels.  Extremities: There are no rashes and no edema.  Neurologic Exam  Mental status: The patient is alert and oriented x 3 at the time of the examination. The patient has apparent normal recent and remote memory, with an apparently normal attention span and concentration ability.   Speech is normal.  Cranial nerves: Extraocular movements are full.   Facial strength is normal.  Trapezius strength is normal.. No obvious hearing deficits are noted.  Motor:  Muscle bulk is normal.   Tone is normal. Strength is  5 / 5 in all 4 extremities.   Sensory: Temperature and touch sensation was symmetric  Coordination: Cerebellar testing reveals good finger-nose-finger and slightly reduced left heel-to-shin .  Gait and station: Station is normal.   Gait is normal but tandem is wide.   Romberg is negative.  Reflexes: Deep tendon reflexes are symmetric and normal in arms and mildly increased at knees.  No ankle clonus.     ASSESSMENT AND PLAN  Optic neuritis  Other fatigue  Pain of left orbit  Attention deficit disorder, unspecified hyperactivity presence   1.   Continue Concerta 36 mg qAm for ADD 2.   Stay active and exercise as tolerated. 3.   The likelihood that she has MS is now very low  as she has had multiple imaging studies after her optic  neuritis in 2009 that were negative.  CSF did not show oligoclonal bands. 4.    rtc 6 months or call sooner if new or worsening symptoms.  We discussed that if her primary care practice is able to write her Concerta we could just make the follow-ups as needed     Cyndra Feinberg A. Felecia Shelling, MD, [facility] 4/71/8550, 15:86 AM Certified in Neurology, Clinical Neurophysiology, Sleep Medicine, Pain Medicine and Neuroimaging  [facility] [address], [place], [place] [address] [phone]

## 2021-01-15 DIAGNOSIS — Z1231 Encounter for screening mammogram for malignant neoplasm of breast: Secondary | ICD-10-CM | POA: Diagnosis not present

## 2021-01-16 ENCOUNTER — Other Ambulatory Visit (HOSPITAL_COMMUNITY): Payer: Self-pay

## 2021-01-21 ENCOUNTER — Other Ambulatory Visit (HOSPITAL_COMMUNITY): Payer: Self-pay

## 2021-01-21 DIAGNOSIS — T7840XA Allergy, unspecified, initial encounter: Secondary | ICD-10-CM | POA: Diagnosis not present

## 2021-01-21 MED FILL — Estradiol TD Patch Twice Weekly 0.05 MG/24HR: TRANSDERMAL | 84 days supply | Qty: 24 | Fill #0 | Status: AC

## 2021-01-29 ENCOUNTER — Other Ambulatory Visit (HOSPITAL_COMMUNITY): Payer: Self-pay

## 2021-01-29 MED ORDER — ACYCLOVIR 200 MG PO CAPS
ORAL_CAPSULE | ORAL | 1 refills | Status: DC
  Filled 2021-01-29: qty 60, 10d supply, fill #0

## 2021-02-05 ENCOUNTER — Other Ambulatory Visit (HOSPITAL_COMMUNITY): Payer: Self-pay

## 2021-03-01 ENCOUNTER — Other Ambulatory Visit (HOSPITAL_COMMUNITY): Payer: Self-pay

## 2021-03-01 MED ORDER — CARESTART COVID-19 HOME TEST VI KIT
PACK | 0 refills | Status: DC
  Filled 2021-03-01: qty 4, 4d supply, fill #0

## 2021-03-05 ENCOUNTER — Ambulatory Visit (INDEPENDENT_AMBULATORY_CARE_PROVIDER_SITE_OTHER): Payer: 59 | Admitting: Internal Medicine

## 2021-03-05 ENCOUNTER — Other Ambulatory Visit: Payer: Self-pay

## 2021-03-05 VITALS — BP 118/80 | HR 78 | Ht 65.0 in | Wt 147.8 lb

## 2021-03-05 DIAGNOSIS — E782 Mixed hyperlipidemia: Secondary | ICD-10-CM

## 2021-03-05 NOTE — Progress Notes (Signed)
LIPID CLINIC CONSULT NOTE  Chief Complaint:  Manage dyslipidemia  Primary Care Physician: Josetta Huddle, MD  Primary Cardiologist:  None  HPI:  [name] [name] is a 50 y.o. female who is being seen today for the evaluation of dyslipidemia at the request of Josetta Huddle, MD. This is a pleasant female kindly referred by Dr. Inda Merlin for evaluation management of dyslipidemia.  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  Past medical history significant for possible multiple sclerosis, depression, anxiety and a skin cancer.  More recently she is noted to have had elevated cholesterol.  This was noted in her father, retired Herbalist, however he could not tolerate statins.  For recently, her total cholesterol was 211, HDL 48, LDL 137 and triglycerides 133.  Not previously been on statin therapy and is preferred to try to manage it with diet alone.  She does not have any additional cardiovascular risk factors and again no family history of early onset heart disease.  PMHx:  Past Medical History:  Diagnosis Date   Allergy    Anxiety    Cancer ([place])    basal cell skin ca   Depression    Family history of adverse reaction to anesthesia    sister woke up during surgery age 47's for tonsillectomy   Multiple sclerosis ([place])    Neuromuscular disorder ([place])    multiple sclerosis   Vision abnormalities     Past Surgical History:  Procedure Laterality Date   adnoidectomy     BUNIONECTOMY Left    foot   CESAREAN SECTION     CHOLECYSTECTOMY     ROBOTIC ASSISTED LAPAROSCOPIC HYSTERECTOMY AND SALPINGECTOMY N/A 01/19/2018   Procedure: XI ROBOTIC ASSISTED LAPAROSCOPIC HYSTERECTOMY AND BILATERAL SALPINGECTOMY, LEFT OVARIAN CYSTECTOMY;  Surgeon: Everitt Amber, MD;  Location: [facility];  Service: Gynecology;  Laterality: N/A;   wisdom teeth exraction      FAMHx:  Family History  Problem Relation Age of Onset   Cervical cancer Mother    Dementia Mother    Melanoma  Father    Ulcerative colitis Sister    Colon cancer Neg Hx    Esophageal cancer Neg Hx    Rectal cancer Neg Hx    Stomach cancer Neg Hx     SOCHx:   reports that she has never smoked. She has never used smokeless tobacco. She reports current alcohol use. She reports that she does not use drugs.  ALLERGIES:  Allergies  Allergen Reactions   Venlafaxine Hives    blisters   Augmentin [Amoxicillin-Pot Clavulanate] Rash    tongue swelling Has patient had a PCN reaction causing immediate rash, facial/tongue/throat swelling, SOB or lightheadedness with hypotension: No Has patient had a PCN reaction causing severe rash involving mucus membranes or skin necrosis: No Has patient had a PCN reaction that required hospitalization: No Has patient had a PCN reaction occurring within the last 10 years: No If all of the above answers are "NO", then may proceed with Cephalosporin use.    Demerol [Meperidine] Swelling and Rash    Tongue swelling   Estrogens Rash    The patient can use patch,No oral   Gabapentin Rash   Stadol [Butorphanol] Swelling and Rash    Tongue swelling    ROS: Pertinent items noted in HPI and remainder of comprehensive ROS otherwise negative.  HOME MEDS: Current Outpatient Medications on File Prior to Visit  Medication Sig Dispense Refill   cetirizine (ZYRTEC) 10 MG  tablet Take 10 mg by mouth at bedtime.      COVID-19 At Home Antigen Test [facility] COVID-19 HOME TEST) KIT Use as directed within the package instructions 4 each 0   cyclobenzaprine (FLEXERIL) 10 MG tablet Take 1 tablet (10 mg total) by mouth at bedtime as needed for muscle spasms 30 tablet 0   estradiol (VIVELLE-DOT) 0.05 MG/24HR patch APPLY 1 PATCH TO SKIN TWICE A WEEK. 24 patch 3   methylphenidate (CONCERTA) 36 MG PO CR tablet Take 1 tablet (36 mg total) by mouth daily. 30 tablet 0   spironolactone (ALDACTONE) 50 MG tablet TAKE 1 TABLET BY MOUTH ONCE DAILY 90 tablet 2   tretinoin (RETIN-A) 0.025 %  cream APPLY PEA SIZED AMOUNT EACH NIGHT TO CLEAN DRY FACE. 20 g 2   No current facility-administered medications on file prior to visit.    LABS/IMAGING: No results found for this or any previous visit (from the past 48 hour(s)). No results found.  LIPID PANEL: No results found for: CHOL, TRIG, HDL, CHOLHDL, VLDL, LDLCALC, LDLDIRECT  WEIGHTS: Wt Readings from Last 3 Encounters:  03/05/21 147 lb 12.8 oz (67 kg)  01/04/21 147 lb (66.7 kg)  10/19/19 153 lb (69.4 kg)    VITALS: BP 118/80   Pulse 78   Ht '5\' 5"'  (1.651 m)   Wt 147 lb 12.8 oz (67 kg)   LMP 07/13/2015 (Exact Date)   SpO2 97%   BMI 24.60 kg/m   EXAM: General appearance: alert and no distress Neck: no carotid bruit, no JVD, and thyroid not enlarged, symmetric, no tenderness/mass/nodules Lungs: clear to auscultation bilaterally Heart: regular rate and rhythm, S1, S2 normal, no murmur, click, rub or gallop Abdomen: soft, non-tender; bowel sounds normal; no masses,  no organomegaly Extremities: extremities normal, atraumatic, no cyanosis or edema Pulses: 2+ and symmetric Skin: Skin color, texture, turgor normal. No rashes or lesions Neurologic: Grossly normal Psych: Pleasant  EKG: Deferred  ASSESSMENT: Mixed dyslipidemia  PLAN: 1.   Ms. Drew has a mixed dyslipidemia and has not been on statin therapy, preferring however to try to manage it with diet.  She is likely low risk based on traditional risk factors.  I suggested further restratification with a calcium score however because her father had high cholesterol and her concerns about unidentified coronary risk.  If her calcium score is 0 or low risk, then she may defer treatment for the time being and continue to focus on diet, exercise and lifestyle modifications.  If for some reason there is significant coronary calcification, then would likely recommend statin therapy.  She is agreeable to this plan.  Thanks again for the kind referral.  Pixie Casino,  MD, FACC, [place] Director of the [facility] Lipid [facility]  Cardiovascular Risk Reduction Clinic Diplomate of the American Board of Clinical Lipidology Attending Cardiologist  Direct Dial: [phone]  Fax: [phone]  Website:  [email or web address] 03/05/2021, 8:50 PM

## 2021-03-05 NOTE — Patient Instructions (Signed)
Medication Instructions:  No changes at this time -- will depend on calcium score results  *If you need a refill on your cardiac medications before your next appointment, please call your pharmacy*   Lab Work: FASTING lab work in about 4 months (before next visit with Dr. Debara Pickett)  If you have labs (blood work) drawn today and your tests are completely normal, you will receive your results only by: [place] (if you have MyChart) OR A paper copy in the mail If you have any lab test that is abnormal or we need to change your treatment, we will call you to review the results.   Testing/Procedures: Dr. Debara Pickett has ordered a CT coronary calcium score. This test can be done at [facility]. This is $99 out of pocket.   Coronary CalciumScan A coronary calcium scan is an imaging test used to look for deposits of calcium and other fatty materials (plaques) in the inner lining of the blood vessels of the heart (coronary arteries). These deposits of calcium and plaques can partly clog and narrow the coronary arteries without producing any symptoms or warning signs. This puts a person at risk for a heart attack. This test can detect these deposits before symptoms develop. Tell a health care provider about: Any allergies you have. All medicines you are taking, including vitamins, herbs, eye drops, creams, and over-the-counter medicines. Any problems you or family members have had with anesthetic medicines. Any blood disorders you have. Any surgeries you have had. Any medical conditions you have. Whether you are pregnant or may be pregnant. What are the risks? Generally, this is a safe procedure. However, problems may occur, including: Harm to a pregnant woman and her unborn baby. This test involves the use of radiation. Radiation exposure can be dangerous to a pregnant woman and her unborn baby. If you are pregnant, you generally should not have this procedure done. Slight increase in the risk  of cancer. This is because of the radiation involved in the test. What happens before the procedure? No preparation is needed for this procedure. What happens during the procedure? You will undress and remove any jewelry around your neck or chest. You will put on a hospital gown. Sticky electrodes will be placed on your chest. The electrodes will be connected to an electrocardiogram (ECG) machine to record a tracing of the electrical activity of your heart. A CT scanner will take pictures of your heart. During this time, you will be asked to lie still and hold your breath for 2-3 seconds while a picture of your heart is being taken. The procedure may vary among health care providers and hospitals. What happens after the procedure? You can get dressed. You can return to your normal activities. It is up to you to get the results of your test. Ask your health care provider, or the department that is doing the test, when your results will be ready. Summary A coronary calcium scan is an imaging test used to look for deposits of calcium and other fatty materials (plaques) in the inner lining of the blood vessels of the heart (coronary arteries). Generally, this is a safe procedure. Tell your health care provider if you are pregnant or may be pregnant. No preparation is needed for this procedure. A CT scanner will take pictures of your heart. You can return to your normal activities after the scan is done. This information is not intended to replace advice given to you by your health care provider. Make sure  you discuss any questions you have with your health care provider. Document Released: 02/21/2008 Document Revised: 07/14/2016 Document Reviewed: 07/14/2016 Elsevier Interactive Patient Education  2017 [place]: At [facility], you and your health needs are our priority.  As part of our continuing mission to provide you with exceptional heart care, we have created  designated Provider Care Teams.  These Care Teams include your primary Cardiologist (physician) and Advanced Practice Providers (APPs -  Physician Assistants and Nurse Practitioners) who all work together to provide you with the care you need, when you need it.  We recommend signing up for the patient portal called "MyChart".  Sign up information is provided on this After Visit Summary.  MyChart is used to connect with patients for Virtual Visits (Telemedicine).  Patients are able to view lab/test results, encounter notes, upcoming appointments, etc.  Non-urgent messages can be sent to your provider as well.   To learn more about what you can do with MyChart, go to [email or web address].    Your next appointment:   4 month(s) - lipid clinic  The format for your next appointment:   In Person  Provider:   K. Mali Hilty, MD   Other Instructions

## 2021-03-06 ENCOUNTER — Telehealth (HOSPITAL_BASED_OUTPATIENT_CLINIC_OR_DEPARTMENT_OTHER): Payer: Self-pay | Admitting: Internal Medicine

## 2021-03-06 NOTE — Telephone Encounter (Signed)
Spoke with patient regarding Wednesday 03/13/21 1:30 pm Calcium Scoring appointment here at [facility]---[address] [facility]--ground floor.  Information is available in My Chart and patient voiced her understanding.

## 2021-03-13 ENCOUNTER — Ambulatory Visit (HOSPITAL_BASED_OUTPATIENT_CLINIC_OR_DEPARTMENT_OTHER)
Admission: RE | Admit: 2021-03-13 | Discharge: 2021-03-13 | Disposition: A | Discharge status: Home / Self Care | Payer: 59 | Source: Ambulatory Visit | Attending: Internal Medicine | Admitting: Internal Medicine

## 2021-03-13 ENCOUNTER — Other Ambulatory Visit: Payer: Self-pay

## 2021-03-13 DIAGNOSIS — E782 Mixed hyperlipidemia: Secondary | ICD-10-CM | POA: Insufficient documentation

## 2021-03-20 ENCOUNTER — Ambulatory Visit (INDEPENDENT_AMBULATORY_CARE_PROVIDER_SITE_OTHER): Payer: Self-pay | Admitting: Plastic Surgery

## 2021-03-20 ENCOUNTER — Other Ambulatory Visit: Payer: Self-pay

## 2021-03-20 ENCOUNTER — Encounter: Payer: Self-pay | Admitting: Plastic Surgery

## 2021-03-20 VITALS — BP 143/97 | HR 86 | Ht 64.0 in | Wt 144.8 lb

## 2021-03-20 DIAGNOSIS — H7193 Unspecified cholesteatoma, bilateral: Secondary | ICD-10-CM

## 2021-03-20 DIAGNOSIS — H0263 Xanthelasma of right eye, unspecified eyelid: Secondary | ICD-10-CM

## 2021-03-20 DIAGNOSIS — H0266 Xanthelasma of left eye, unspecified eyelid: Secondary | ICD-10-CM

## 2021-03-20 DIAGNOSIS — Z411 Encounter for cosmetic surgery: Secondary | ICD-10-CM

## 2021-03-20 NOTE — Progress Notes (Addendum)
Referring Provider Josetta Huddle, MD [address] [place],  [place] [address]   CC:  Chief Complaint  Patient presents with   Advice Only      [name] [name] is an 50 y.o. female.  HPI: Patient presents to discuss upper eyelid lesions.  They have been present for a number of years and seem to be getting larger.  She has had another provider try to perform a freezing type procedure to get them to smooth out which helped a little bit but they seem to be recurring and coming back.  She is interested in having them removed.  She has been told they are cholesterol deposits.  She does have elevated cholesterol levels but they seem to be under control.  She is also bothered by a prominence in the submental area.  She feels like this is been getting worse with time.  She is bothered by her parents primarily in profile and wants to see if anything can be done.  Allergies  Allergen Reactions   Venlafaxine Hives    blisters   Augmentin [Amoxicillin-Pot Clavulanate] Rash    tongue swelling Has patient had a PCN reaction causing immediate rash, facial/tongue/throat swelling, SOB or lightheadedness with hypotension: No Has patient had a PCN reaction causing severe rash involving mucus membranes or skin necrosis: No Has patient had a PCN reaction that required hospitalization: No Has patient had a PCN reaction occurring within the last 10 years: No If all of the above answers are "NO", then may proceed with Cephalosporin use.    Demerol [Meperidine] Swelling and Rash    Tongue swelling   Estrogens Rash    The patient can use patch,No oral   Gabapentin Rash   Stadol [Butorphanol] Swelling and Rash    Tongue swelling    Outpatient Encounter Medications as of 03/20/2021  Medication Sig   cetirizine (ZYRTEC) 10 MG tablet Take 10 mg by mouth at bedtime.    COVID-19 At Home Antigen Test [facility] COVID-19 HOME TEST) KIT Use as directed within the package instructions    cyclobenzaprine (FLEXERIL) 10 MG tablet Take 1 tablet (10 mg total) by mouth at bedtime as needed for muscle spasms   estradiol (VIVELLE-DOT) 0.05 MG/24HR patch APPLY 1 PATCH TO SKIN TWICE A WEEK.   methylphenidate (CONCERTA) 36 MG PO CR tablet Take 1 tablet (36 mg total) by mouth daily.   spironolactone (ALDACTONE) 50 MG tablet TAKE 1 TABLET BY MOUTH ONCE DAILY   tretinoin (RETIN-A) 0.025 % cream APPLY PEA SIZED AMOUNT EACH NIGHT TO CLEAN DRY FACE.   No facility-administered encounter medications on file as of 03/20/2021.     Past Medical History:  Diagnosis Date   Allergy    Anxiety    Cancer ([place])    basal cell skin ca   Depression    Family history of adverse reaction to anesthesia    sister woke up during surgery age 36's for tonsillectomy   Multiple sclerosis ([place])    Neuromuscular disorder ([employer])    multiple sclerosis   Vision abnormalities     Past Surgical History:  Procedure Laterality Date   adnoidectomy     BUNIONECTOMY Left    foot   CESAREAN SECTION     CHOLECYSTECTOMY     ROBOTIC ASSISTED LAPAROSCOPIC HYSTERECTOMY AND SALPINGECTOMY N/A 01/19/2018   Procedure: XI ROBOTIC ASSISTED LAPAROSCOPIC HYSTERECTOMY AND BILATERAL SALPINGECTOMY, LEFT OVARIAN CYSTECTOMY;  Surgeon: Everitt Amber, MD;  Location: [facility];  Service: Gynecology;  Laterality: N/A;   wisdom teeth exraction      Family History  Problem Relation Age of Onset   Cervical cancer Mother    Dementia Mother    Melanoma Father    Ulcerative colitis Sister    Colon cancer Neg Hx    Esophageal cancer Neg Hx    Rectal cancer Neg Hx    Stomach cancer Neg Hx     Social History   Social History Narrative   Lives with son and daughter   Right Handed   Drinks 5 cups caffeine daily     Review of Systems General: Denies fevers, chills, weight loss CV: Denies chest pain, shortness of breath, palpitations  Physical Exam Vitals with BMI 03/20/2021 03/05/2021 03/05/2021  Height '5\' 4"'  - '5\' 5"'   Weight 144 lbs  13 oz - 147 lbs 13 oz  BMI 83.81 - 84.0  Systolic 375 436 067  Diastolic 97 80 78  Pulse 86 - 78    General:  No acute distress,  Alert and oriented, Non-Toxic, Normal speech and affect Upper eyelid shows several yellowish elevated plaques consistent with xanthelasma.  These are in the medial aspect of the upper lids bilaterally.  Submental area shows moderate fullness with reasonable skin elasticity.  She does not have much in the way of jowls or lateral descent of the soft tissues of the face.  No obvious scars.  Cranial nerves grossly intact.  Assessment/Plan Patient presents with xanthelasma of the bilateral upper eyelids.  She also has cosmetic concerns regarding submental fullness.  We discussed direct excision of the lesions of the upper eyelids.  I discussed the risks and benefits of this procedure including bleeding, infection, damage to surrounding structures and need for additional procedures.  Regarding the submental area the most definitive way would be to perform neck liposuction and additional direct excision of submental fat with potentially some plication of the platysma in the central neck and submental area.  I discussed the risks and benefits of this procedure which are similar to above with the addition of some outside chance of facial nerve palsy related to the liposuction cannula.  She is interested in moving forward we will plan to try to arrange this for her.  All of her questions were answered.  Cindra Presume 03/20/2021, 9:21 AM

## 2021-03-27 ENCOUNTER — Other Ambulatory Visit: Payer: Self-pay

## 2021-03-27 ENCOUNTER — Telehealth (INDEPENDENT_AMBULATORY_CARE_PROVIDER_SITE_OTHER): Payer: Self-pay

## 2021-03-27 DIAGNOSIS — Z719 Counseling, unspecified: Secondary | ICD-10-CM

## 2021-03-27 DIAGNOSIS — H0263 Xanthelasma of right eye, unspecified eyelid: Secondary | ICD-10-CM

## 2021-03-27 DIAGNOSIS — H0266 Xanthelasma of left eye, unspecified eyelid: Secondary | ICD-10-CM

## 2021-04-05 ENCOUNTER — Other Ambulatory Visit: Payer: Self-pay | Admitting: Plastic Surgery

## 2021-04-05 DIAGNOSIS — Z411 Encounter for cosmetic surgery: Secondary | ICD-10-CM | POA: Diagnosis not present

## 2021-04-05 DIAGNOSIS — H0266 Xanthelasma of left eye, unspecified eyelid: Secondary | ICD-10-CM | POA: Diagnosis not present

## 2021-04-05 DIAGNOSIS — H0263 Xanthelasma of right eye, unspecified eyelid: Secondary | ICD-10-CM | POA: Diagnosis not present

## 2021-04-05 DIAGNOSIS — H0261 Xanthelasma of right upper eyelid: Secondary | ICD-10-CM | POA: Diagnosis not present

## 2021-04-05 DIAGNOSIS — H0264 Xanthelasma of left upper eyelid: Secondary | ICD-10-CM | POA: Diagnosis not present

## 2021-04-05 MED ORDER — HYDROCODONE-ACETAMINOPHEN 5-325 MG PO TABS: 1.0000 | ORAL_TABLET | ORAL | 0 refills | Status: DC | PRN

## 2021-04-05 MED ORDER — ONDANSETRON HCL 4 MG PO TABS: 4.0000 mg | ORAL_TABLET | Freq: Three times a day (TID) | ORAL | 0 refills | Status: DC | PRN

## 2021-04-10 ENCOUNTER — Ambulatory Visit: Payer: 59 | Admitting: Allergy

## 2021-04-11 ENCOUNTER — Other Ambulatory Visit: Payer: Self-pay

## 2021-04-11 ENCOUNTER — Ambulatory Visit (INDEPENDENT_AMBULATORY_CARE_PROVIDER_SITE_OTHER): Payer: Self-pay | Admitting: Plastic Surgery

## 2021-04-11 DIAGNOSIS — Z411 Encounter for cosmetic surgery: Secondary | ICD-10-CM

## 2021-04-11 NOTE — Progress Notes (Signed)
Patient presents postop from excision of bilateral upper eyelid xanthelasma lesions and neck liposuction with central neck lift.  She overall feels like things are going well.  On exam the left upper eyelid incision is healing fine.  There is a small separation in the right upper lid incision about a millimeter in size but this looks to be coming along okay as well.  The incision beneath her chin is doing well and I do believe she has a nice correction of her submental fullness.  We will plan to have her continue to wear chinstrap as much as she can tolerate and I will see her again in a few weeks.  All of her questions were answered.

## 2021-04-14 NOTE — Progress Notes (Signed)
03/27/21     Patient ID: [name] [name], female    DOB: 09-20-1970, 50 y.o.   MRN: [id number]  No chief complaint on file.   No diagnosis found. Bilateral upper eyelid lesions & encounter for cosmetic procedure   History of Present Illness: [name] [name] is a 50 y.o.  female  with a history of bilateral upper eyelid lesions.  She presents via virtual/phone visit for preoperative evaluation for upcoming procedure, bilateral upper eyelid lesions excision & central neck liposuction/lift scheduled for 04/05/21 with Dr. Claudia Desanctis.  The patient has had problems with anesthesia. PONV & states family hx of adverse reaction to general anesthesia- sister  Summary of Previous Visit: consult for upper eyelid lesion removal & cosmetic procedure  Job: RN- [employer]  PMH Significant for:   Cardiac Ablation 2001 for SVT & stress test =negative  no symptoms or cardiac complications since/ last blood test = WNL Hx of varicose veins- injection in 2008 Hx of feet/ankle swelling Optic neuritis Hx of MS  Dx- no symptoms  Past Medical History: Allergies: Allergies  Allergen Reactions   Venlafaxine Hives    blisters   Augmentin [Amoxicillin-Pot Clavulanate] Rash    tongue swelling Has patient had a PCN reaction causing immediate rash, facial/tongue/throat swelling, SOB or lightheadedness with hypotension: No Has patient had a PCN reaction causing severe rash involving mucus membranes or skin necrosis: No Has patient had a PCN reaction that required hospitalization: No Has patient had a PCN reaction occurring within the last 10 years: No If all of the above answers are "NO", then may proceed with Cephalosporin use.    Demerol [Meperidine] Swelling and Rash    Tongue swelling   Estrogens Rash    The patient can use patch,No oral   Gabapentin Rash   Stadol [Butorphanol] Swelling and Rash    Tongue swelling    Current Medications:  Current Outpatient Medications:    cetirizine (ZYRTEC) 10 MG  tablet, Take 10 mg by mouth at bedtime. , Disp: , Rfl:    COVID-19 At Home Antigen Test (CARESTART COVID-19 HOME TEST) KIT, Use as directed within the package instructions, Disp: 4 each, Rfl: 0   cyclobenzaprine (FLEXERIL) 10 MG tablet, Take 1 tablet (10 mg total) by mouth at bedtime as needed for muscle spasms, Disp: 30 tablet, Rfl: 0   estradiol (VIVELLE-DOT) 0.05 MG/24HR patch, APPLY 1 PATCH TO SKIN TWICE A WEEK., Disp: 24 patch, Rfl: 3   HYDROcodone-acetaminophen (NORCO) 5-325 MG tablet, Take 1 tablet by mouth every 4 (four) hours as needed for moderate pain., Disp: 25 tablet, Rfl: 0   methylphenidate (CONCERTA) 36 MG PO CR tablet, Take 1 tablet (36 mg total) by mouth daily., Disp: 30 tablet, Rfl: 0   ondansetron (ZOFRAN) 4 MG tablet, Take 1 tablet (4 mg total) by mouth every 8 (eight) hours as needed for nausea or vomiting., Disp: 5 tablet, Rfl: 0   spironolactone (ALDACTONE) 50 MG tablet, TAKE 1 TABLET BY MOUTH ONCE DAILY, Disp: 90 tablet, Rfl: 2   tretinoin (RETIN-A) 0.025 % cream, APPLY PEA SIZED AMOUNT EACH NIGHT TO CLEAN DRY FACE., Disp: 20 g, Rfl: 2  Past Medical Problems: Past Medical History:  Diagnosis Date   Allergy    Anxiety    Cancer ([place])    basal cell skin ca   Depression    Family history of adverse reaction to anesthesia    sister woke up during surgery age 24's for tonsillectomy   Multiple sclerosis ([place])  Neuromuscular disorder ([place])    multiple sclerosis   Vision abnormalities     Past Surgical History: Past Surgical History:  Procedure Laterality Date   adnoidectomy     BUNIONECTOMY Left    foot   CESAREAN SECTION     CHOLECYSTECTOMY     ROBOTIC ASSISTED LAPAROSCOPIC HYSTERECTOMY AND SALPINGECTOMY N/A 01/19/2018   Procedure: XI ROBOTIC ASSISTED LAPAROSCOPIC HYSTERECTOMY AND BILATERAL SALPINGECTOMY, LEFT OVARIAN CYSTECTOMY;  Surgeon: Everitt Amber, MD;  Location: [facility];  Service: Gynecology;  Laterality: N/A;   wisdom teeth exraction      Social  History: Social History   Socioeconomic History   Marital status: Divorced    Spouse name: Not on file   Number of children: 2   Years of education: Not on file   Highest education level: Not on file  Occupational History   Occupation: full time  Tobacco Use   Smoking status: Never   Smokeless tobacco: Never  Vaping Use   Vaping Use: Never used  Substance and Sexual Activity   Alcohol use: Yes    Alcohol/week: 0.0 standard drinks    Comment: rarely   Drug use: No   Sexual activity: Yes  Other Topics Concern   Not on file  Social History Narrative   Lives with son and daughter   Right Handed   Drinks 5 cups caffeine daily   Social Determinants of Health   Financial Resource Strain: Not on file  Food Insecurity: Not on file  Transportation Needs: Not on file  Physical Activity: Not on file  Stress: Not on file  Social Connections: Not on file  Intimate Partner Violence: Not on file    Family History: Family History  Problem Relation Age of Onset   Cervical cancer Mother    Dementia Mother    Melanoma Father    Ulcerative colitis Sister    Colon cancer Neg Hx    Esophageal cancer Neg Hx    Rectal cancer Neg Hx    Stomach cancer Neg Hx     Review of Systems: ROS Wears glasses  Denies any of the following: No asthma/sleep apnea or other respiratory complications Cardiac hx- SVT- ablation No personal hx of DVT/PE- did have varicose veins injected in 2008 Dad had DVT- following knee surgery No DM Does not smoke   Physical Exam: Vital Signs LMP 07/13/2015 (Exact Date)   Physical Exam - virtual preop Constitutional:      General: Not in acute distress.    Appearance: Normal appearance. Not ill-appearing.  HENT:     Head: Normocephalic and atraumatic.  Eyes:     Pupils: Pupils are equal, round Neck:     Musculoskeletal: Normal range of motion.  Cardiovascular:     Rate and Rhythm: Normal rate    Pulses: Normal pulses.  Pulmonary:     Effort:  Pulmonary effort is normal. No respiratory distress.  Abdominal:     General: Abdomen is flat. There is no distension.  Musculoskeletal: Normal range of motion.  Skin:    General: Skin is warm and dry.     Findings: No erythema or rash.  Neurological:     General: No focal deficit present.     Mental Status: Alert and oriented to person, place, and time. Mental status is at baseline.     Motor: No weakness.  Psychiatric:        Mood and Affect: Mood normal.        Behavior: Behavior normal.  Assessment/Plan: The patient is scheduled for excision of bilateral upper eyelid lesion & central neck liposuction & lift with Dr. Claudia Desanctis.  Risks, benefits, and alternatives of procedure discussed, questions answered and consent obtained.    Smoking Status: no; Counseling Given? N/A  Last Mammogram: 2021; Results: neg  Caprini Score: 7; Risk Factors include: age/female, family hx of DVT, and length of planned surgery. Recommendation for mechanical SCD prophylaxis. Encourage early ambulation.   Pictures obtained: yes  Post-op Rx sent to pharmacy: yes  Wound care supplies ordered no  Patient was provided with the  General Surgical Risk consent document and Pain Medication Agreement prior to their appointment.  They had adequate time to read through the risk consent documents and Pain Medication Agreement. We also discussed them in person together during this preop appointment. All of their questions were answered to their satisfaction.  Recommended calling if they have any further questions.  Risk consent form and Pain Medication Agreement to be scanned into patient's chart.     Electronically signed by: [place], RN 04/14/2021 8:16 PM

## 2021-04-14 NOTE — Patient Instructions (Signed)
Patient will call for any changes

## 2021-04-19 ENCOUNTER — Encounter: Payer: 59 | Admitting: Surgical

## 2021-04-25 ENCOUNTER — Other Ambulatory Visit (HOSPITAL_COMMUNITY): Payer: Self-pay

## 2021-04-25 MED ORDER — ACYCLOVIR 200 MG PO CAPS
ORAL_CAPSULE | ORAL | 1 refills | Status: DC
  Filled 2021-04-25: qty 60, 10d supply, fill #0

## 2021-05-01 ENCOUNTER — Ambulatory Visit (INDEPENDENT_AMBULATORY_CARE_PROVIDER_SITE_OTHER): Payer: 59 | Admitting: Surgical

## 2021-05-01 ENCOUNTER — Other Ambulatory Visit (HOSPITAL_COMMUNITY): Payer: Self-pay

## 2021-05-01 ENCOUNTER — Encounter: Payer: Self-pay | Admitting: Surgical

## 2021-05-01 ENCOUNTER — Other Ambulatory Visit: Payer: Self-pay

## 2021-05-01 DIAGNOSIS — H0266 Xanthelasma of left eye, unspecified eyelid: Secondary | ICD-10-CM

## 2021-05-01 DIAGNOSIS — H0263 Xanthelasma of right eye, unspecified eyelid: Secondary | ICD-10-CM

## 2021-05-01 DIAGNOSIS — Z411 Encounter for cosmetic surgery: Secondary | ICD-10-CM

## 2021-05-01 MED FILL — Estradiol TD Patch Twice Weekly 0.05 MG/24HR: TRANSDERMAL | 84 days supply | Qty: 24 | Fill #1 | Status: AC

## 2021-05-01 NOTE — Progress Notes (Signed)
Patient is a 50 year old female here for follow-up after bilateral upper eyelid lesion excision and central neck liposuction/lift with Dr. Claudia Desanctis on 04/05/2021.  She is just shy of 4 weeks postop.  She reports that she is doing really well, reports some ongoing numbness to her neck/chin.  She reports that she is very happy with how things are healing and has no complaints today.  On exam bilateral upper eyelids incisions are intact and well-healed.  No swelling is noted.  No wounds are noted. Neck incision is intact and well-healing.  No erythema noted.  She does have some tenderness of the upper neck, but no subcutaneous fluid collection noted with palpation.  No overlying skin changes noted.  Patient is doing really well postop from eye surgery and neck liposuction/lift.  No signs of infection on exam.  Recommend following up as needed.  Recommend calling with questions or concerns.

## 2021-05-09 ENCOUNTER — Other Ambulatory Visit: Payer: Self-pay | Admitting: Internal Medicine

## 2021-05-09 DIAGNOSIS — E782 Mixed hyperlipidemia: Secondary | ICD-10-CM

## 2021-05-14 ENCOUNTER — Other Ambulatory Visit: Payer: Self-pay | Admitting: Neurology

## 2021-05-14 ENCOUNTER — Other Ambulatory Visit (HOSPITAL_COMMUNITY): Payer: Self-pay

## 2021-05-14 MED ORDER — METHYLPHENIDATE HCL ER (OSM) 36 MG PO TBCR
36.0000 mg | EXTENDED_RELEASE_TABLET | Freq: Every day | ORAL | 0 refills | Status: DC
  Filled 2021-05-14: qty 30, 30d supply, fill #0

## 2021-05-15 ENCOUNTER — Other Ambulatory Visit (HOSPITAL_COMMUNITY): Payer: Self-pay

## 2021-05-15 MED ORDER — CYCLOBENZAPRINE HCL 10 MG PO TABS
ORAL_TABLET | ORAL | 0 refills | Status: DC
  Filled 2021-05-15: qty 30, 30d supply, fill #0

## 2021-05-31 ENCOUNTER — Other Ambulatory Visit (HOSPITAL_COMMUNITY): Payer: Self-pay

## 2021-05-31 DIAGNOSIS — R1084 Generalized abdominal pain: Secondary | ICD-10-CM | POA: Diagnosis not present

## 2021-05-31 DIAGNOSIS — R310 Gross hematuria: Secondary | ICD-10-CM | POA: Diagnosis not present

## 2021-05-31 DIAGNOSIS — R109 Unspecified abdominal pain: Secondary | ICD-10-CM | POA: Diagnosis not present

## 2021-05-31 DIAGNOSIS — Z9049 Acquired absence of other specified parts of digestive tract: Secondary | ICD-10-CM | POA: Diagnosis not present

## 2021-05-31 MED ORDER — NITROFURANTOIN MONOHYD MACRO 100 MG PO CAPS
100.0000 mg | ORAL_CAPSULE | Freq: Two times a day (BID) | ORAL | 0 refills | Status: DC
  Filled 2021-05-31: qty 14, 7d supply, fill #0

## 2021-06-17 ENCOUNTER — Encounter: Payer: Self-pay | Admitting: Allergy

## 2021-06-17 ENCOUNTER — Other Ambulatory Visit: Payer: Self-pay

## 2021-06-17 ENCOUNTER — Ambulatory Visit: Payer: 59 | Admitting: Allergy

## 2021-06-17 ENCOUNTER — Other Ambulatory Visit (HOSPITAL_COMMUNITY): Payer: Self-pay

## 2021-06-17 VITALS — BP 126/82 | HR 97 | Temp 97.7°F | Resp 18 | Ht 65.0 in | Wt 146.6 lb

## 2021-06-17 DIAGNOSIS — J310 Chronic rhinitis: Secondary | ICD-10-CM | POA: Diagnosis not present

## 2021-06-17 DIAGNOSIS — Z889 Allergy status to unspecified drugs, medicaments and biological substances status: Secondary | ICD-10-CM | POA: Diagnosis not present

## 2021-06-17 DIAGNOSIS — T781XXD Other adverse food reactions, not elsewhere classified, subsequent encounter: Secondary | ICD-10-CM

## 2021-06-17 DIAGNOSIS — T781XXA Other adverse food reactions, not elsewhere classified, initial encounter: Secondary | ICD-10-CM | POA: Insufficient documentation

## 2021-06-17 DIAGNOSIS — T7840XA Allergy, unspecified, initial encounter: Secondary | ICD-10-CM | POA: Insufficient documentation

## 2021-06-17 DIAGNOSIS — T7840XD Allergy, unspecified, subsequent encounter: Secondary | ICD-10-CM

## 2021-06-17 DIAGNOSIS — T7819XA Other adverse food reactions, not elsewhere classified, initial encounter: Secondary | ICD-10-CM | POA: Insufficient documentation

## 2021-06-17 MED ORDER — EPINEPHRINE 0.3 MG/0.3ML IJ SOAJ
0.3000 mg | INTRAMUSCULAR | 2 refills | Status: AC | PRN
  Filled 2021-06-17: qty 2, 30d supply, fill #0

## 2021-06-17 NOTE — Assessment & Plan Note (Addendum)
Allergic reaction to possibly shrimp in the form of nasal congestion, coughing, shortness of breath, itchy eyes within 30 minutes of ingestion.  Symptoms resolved after Benadryl within 2 hours.  Previously tolerated shrimp with no issues.  No issues with fish.  2022 blood work was negative to shellfish panel.  Today's skin prick testing showed: Negative to seafood including shrimp and dust mites but positive control was negative questioning the validity of the results.  Patient held zyrtec for 5 days.   Dust mite was checked due to cross reactivity between dust mites and the shellfish tropomyosin protein.  Discussed with patient that her symptoms and timeline supports an allergic reaction to possibly the shrimp.   Start strict avoidance of shellfish.  I have prescribed epinephrine injectable device and demonstrated proper use. For mild symptoms you can take over the counter antihistamines such as Benadryl and monitor symptoms closely. If symptoms worsen or if you have severe symptoms including breathing issues, throat closure, significant swelling, whole body hives, severe diarrhea and vomiting, lightheadedness then inject epinephrine and seek immediate medical care afterwards.  Emergency action plan given.  If interested we can schedule food challenge to shrimp. You must be off antihistamines for 3-5 days before. Must be in good health and not ill. No vaccines/injections within the past 7 days. Not on any antibiotics. Plan on being in the office for 2-3 hours and must bring in the food you want to do the oral challenge for - lightly seasoned 6-10 cooked shrimp. You must call to schedule an appointment and specify it's for a food challenge.   Will perform skin prick testing with the fresh shrimp first.

## 2021-06-17 NOTE — Assessment & Plan Note (Signed)
Avoiding multiple drugs due to mainly rash issues.   Continue to avoid medications on allergy list.  Consider penicillin allergy skin testing and in office drug challenge in the future.  Over 90% of people with history of penicillin allergy which occurred over 10 years ago are found to be non-allergic.

## 2021-06-17 NOTE — Progress Notes (Signed)
New Patient Note  RE: [name] [name] MRN: [id number] DOB: [birth year] Date of Office Visit: 06/17/2021  Consult requested by: Josetta Huddle, MD Primary care provider: Josetta Huddle, MD  Chief Complaint: Angioedema and Allergic Reaction (Had an episode - shortness of breath, and has a lot of prescription allergies. Thought it was due to shellfish, but it came back negative (bloodwork) )  History of Present Illness: I had the pleasure of seeing [name] [name] for initial evaluation at the [facility] on 06/17/2021. She is a 50 y.o. female, who is referred here by Josetta Huddle, MD for the evaluation of allergic reactions.  She reports food allergy to possibly shellfish. The reaction occurred a few months ago, after she ate abut 10 cocktail shrimp with vegetables.   Symptoms started within 30 minutes and was in the form of nasal congestion, coughing, shortness of breath, itchy eyes. Later on she had some diarrhea and cramping. Denies any swelling, wheezing, vomiting. Denies any associated cofactors such as exertion, infection, NSAID use. She had 1 glass of wine with this. The symptoms lasted for 2 hours. She was not evaluated in ER. Since this episode, she does not report other accidental exposures to shellfish. She does not have access to epinephrine autoinjector.  Previously tolerated shrimp with no issues. Her boyfriend had the same shrimp as well with no issues that day.   Past work up includes: 01/21/2021 shellfish panel was all negative via bloodwork. Dietary History: patient has been eating other foods including milk, eggs, peanut, treenuts, sesame, limited fish, limited soy, wheat, meats, fruits and vegetables.  She reports reading labels and avoiding shellfish in diet completely.   Drug reaction: Currently avoiding Effexor, augment, demerol, oral estrogens (tolerates patch), gabapentin and stadol. Most of these reactions occur within hours to weeks in the form of  rash. Rash resolves within a few days.   Symptoms include: itching and hives Treatment included: antihistamines. Mucosal involvement: no.  Follows with neurology for questionable MS.  Patient has personal stress as she just placed one of her parents into hospice.   Assessment and Plan: [name] [name] is a 50 y.o. female with: Allergic reaction Allergic reaction to possibly shrimp in the form of nasal congestion, coughing, shortness of breath, itchy eyes within 30 minutes of ingestion.  Symptoms resolved after Benadryl within 2 hours.  Previously tolerated shrimp with no issues.  No issues with fish.  2022 blood work was negative to shellfish panel. Today's skin prick testing showed: Negative to seafood including shrimp and dust mites but positive control was negative questioning the validity of the results. Patient held zyrtec for 5 days.  Dust mite was checked due to cross reactivity between dust mites and the shellfish tropomyosin protein. Discussed with patient that her symptoms and timeline supports an allergic reaction to possibly the shrimp.  Start strict avoidance of shellfish. I have prescribed epinephrine injectable device and demonstrated proper use. For mild symptoms you can take over the counter antihistamines such as Benadryl and monitor symptoms closely. If symptoms worsen or if you have severe symptoms including breathing issues, throat closure, significant swelling, whole body hives, severe diarrhea and vomiting, lightheadedness then inject epinephrine and seek immediate medical care afterwards. Emergency action plan given. If interested we can schedule food challenge to shrimp. You must be off antihistamines for 3-5 days before. Must be in good health and not ill. No vaccines/injections within the past 7 days. Not on any antibiotics. Plan on being in the office  for 2-3 hours and must bring in the food you want to do the oral challenge for - lightly seasoned 6-10 cooked shrimp. You must  call to schedule an appointment and specify it's for a food challenge.  Will perform skin prick testing with the fresh shrimp first.   Chronic rhinitis Mild rhinoconjunctivitis symptoms the spring and fall.  Takes Zyrtec as needed with good benefit.  No prior allergy testing. Declines environmental allergy testing today as symptoms are minimal. Use over the counter antihistamines such as Zyrtec (cetirizine), Claritin (loratadine), Allegra (fexofenadine), or Xyzal (levocetirizine) daily as needed. May switch antihistamines every few months.  Multiple drug allergies Avoiding multiple drugs due to mainly rash issues.  Continue to avoid medications on allergy list.  Consider penicillin allergy skin testing and in office drug challenge in the future.  Over 90% of people with history of penicillin allergy which occurred over 10 years ago are found to be non-allergic.   Return if symptoms worsen or fail to improve, for Food challenge.  Meds ordered this encounter  Medications   EPINEPHrine 0.3 mg/0.3 mL IJ SOAJ injection    Sig: Inject 1 pen (0.3 mg) into the muscle as needed for anaphylaxis.    Dispense:  1 each    Refill:  2    May dispense generic/Mylan/Teva brand.    Lab Orders  No laboratory test(s) ordered today    Other allergy screening: Asthma: no Rhino conjunctivitis: yes Some mild rhino conjunctivitis symptoms in the spring and fall. Takes zyrtec prn with good benefit. No prior allergy testing.   Hymenoptera allergy: no Urticaria: no Eczema:no History of recurrent infections suggestive of immunodeficency: no  Diagnostics: Skin Testing: Select foods and dust mites Negative to seafood including shrimp and dust mites but positive control was negative questioning the validity of the results. Results discussed with patient/family.  Airborne Adult Perc - 06/17/21 0930     Time Antigen Placed 0930    Allergen Manufacturer Lavella Hammock    Location Back    Number of Test 2    51.  Mite, D Farinae  5,000 AU/ml Negative    52. Mite, D Pteronyssinus  5,000 AU/ml Negative             Food Adult Perc - 06/17/21 0900     Time Antigen Placed 1914    Allergen Manufacturer Lavella Hammock    Location Arm    Number of allergen test 16     Control-buffer 50% Glycerol Negative    Control-Histamine 1 mg/ml Negative    8. Shellfish Mix Negative    9. Fish Mix Negative    18. Catfish Negative    19. Bass Negative    20. Trout Negative    21. Tuna Negative    22. Salmon Negative    23. Flounder Negative    24. Codfish Negative    25. Shrimp Negative    26. Crab Negative    27. Lobster Negative    28. Oyster Negative    29. Scallops Negative             Past Medical History: Patient Active Problem List   Diagnosis Date Noted   Adverse food reaction 06/17/2021   Allergic reaction 06/17/2021   Chronic rhinitis 06/17/2021   Multiple drug allergies 06/17/2021   Attention deficit disorder 01/04/2021   Neck pain 05/10/2019   Pain of left orbit 05/10/2019   Fecal incontinence 05/31/2018   Adenomyosis 01/19/2018   Abnormal uterine bleeding (AUB) 01/19/2018  Abnormal uterine bleeding 01/19/2018   Spider veins of both lower extremities 11/02/2017   Optic neuritis 10/12/2017   Other fatigue 10/12/2017   Numbness 10/12/2017   Gait disturbance 10/12/2017   Past Medical History:  Diagnosis Date   Allergy    Angio-edema    Anxiety    Cancer ([place])    basal cell skin ca   Depression    Family history of adverse reaction to anesthesia    sister woke up during surgery age 17's for tonsillectomy   Multiple sclerosis ([place])    Neuromuscular disorder ([place])    multiple sclerosis   Vision abnormalities    Past Surgical History: Past Surgical History:  Procedure Laterality Date   ADENOIDECTOMY     adnoidectomy     BUNIONECTOMY Left    foot   CESAREAN SECTION     CHOLECYSTECTOMY     ROBOTIC ASSISTED LAPAROSCOPIC HYSTERECTOMY AND SALPINGECTOMY N/A 01/19/2018    Procedure: XI ROBOTIC ASSISTED LAPAROSCOPIC HYSTERECTOMY AND BILATERAL SALPINGECTOMY, LEFT OVARIAN CYSTECTOMY;  Surgeon: Everitt Amber, MD;  Location: [facility];  Service: Gynecology;  Laterality: N/A;   wisdom teeth exraction     Medication List:  Current Outpatient Medications  Medication Sig Dispense Refill   acyclovir (ZOVIRAX) 200 MG capsule Take 2 capsules by mouth three times a day for 5 days as needed for fever blisters 60 capsule 1   cetirizine (ZYRTEC) 10 MG tablet Take 10 mg by mouth at bedtime.      COVID-19 At Home Antigen Test [facility] COVID-19 HOME TEST) KIT Use as directed within the package instructions 4 each 0   cyclobenzaprine (FLEXERIL) 10 MG tablet Take 1 tablet (10 mg total) by mouth at bedtime as needed for muscle spasms 30 tablet 0   EPINEPHrine 0.3 mg/0.3 mL IJ SOAJ injection Inject 1 pen (0.3 mg) into the muscle as needed for anaphylaxis. 1 each 2   estradiol (VIVELLE-DOT) 0.05 MG/24HR patch APPLY 1 PATCH TO SKIN TWICE A WEEK. 24 patch 3   methylphenidate (CONCERTA) 36 MG PO CR tablet Take 1 tablet (36 mg total) by mouth daily. 30 tablet 0   nitrofurantoin, macrocrystal-monohydrate, (MACROBID) 100 MG capsule Take 1 capsule (100 mg total) by mouth 2 (two) times daily. 14 capsule 0   ondansetron (ZOFRAN) 4 MG tablet Take 1 tablet (4 mg total) by mouth every 8 (eight) hours as needed for nausea or vomiting. 5 tablet 0   spironolactone (ALDACTONE) 50 MG tablet TAKE 1 TABLET BY MOUTH ONCE DAILY 90 tablet 2   tretinoin (RETIN-A) 0.025 % cream APPLY PEA SIZED AMOUNT EACH NIGHT TO CLEAN DRY FACE. 20 g 2   HYDROcodone-acetaminophen (NORCO) 5-325 MG tablet Take 1 tablet by mouth every 4 (four) hours as needed for moderate pain. (Patient not taking: Reported on 06/17/2021) 25 tablet 0   No current facility-administered medications for this visit.   Allergies: Allergies  Allergen Reactions   Venlafaxine Hives    blisters   Augmentin [Amoxicillin-Pot Clavulanate] Rash    tongue  swelling Has patient had a PCN reaction causing immediate rash, facial/tongue/throat swelling, SOB or lightheadedness with hypotension: No Has patient had a PCN reaction causing severe rash involving mucus membranes or skin necrosis: No Has patient had a PCN reaction that required hospitalization: No Has patient had a PCN reaction occurring within the last 10 years: No If all of the above answers are "NO", then may proceed with Cephalosporin use.    Demerol [Meperidine] Swelling and Rash    Tongue  swelling   Estrogens Rash    The patient can use patch,No oral   Gabapentin Rash   Stadol [Butorphanol] Swelling and Rash    Tongue swelling   Social History: Social History   Socioeconomic History   Marital status: Divorced    Spouse name: Not on file   Number of children: 2   Years of education: Not on file   Highest education level: Not on file  Occupational History   Occupation: full time  Tobacco Use   Smoking status: Never   Smokeless tobacco: Never  Vaping Use   Vaping Use: Never used  Substance and Sexual Activity   Alcohol use: Yes    Alcohol/week: 0.0 standard drinks    Comment: rarely   Drug use: No   Sexual activity: Yes  Other Topics Concern   Not on file  Social History Narrative   Lives with son and daughter   Right Handed   Drinks 5 cups caffeine daily   Social Determinants of Health   Financial Resource Strain: Not on file  Food Insecurity: Not on file  Transportation Needs: Not on file  Physical Activity: Not on file  Stress: Not on file  Social Connections: Not on file   Lives in a house built in the [place]. Smoking: denies Occupation: Health and safety inspector HistoryFreight forwarder in the house: no Charity fundraiser in the family room: no Carpet in the bedroom: no Heating: gas Cooling: central Pet: yes 1 cat x 7 yrs  Family History: Family History  Problem Relation Age of Onset   Cervical cancer Mother    Dementia Mother    Melanoma Father     Ulcerative colitis Sister    Colon cancer Neg Hx    Esophageal cancer Neg Hx    Rectal cancer Neg Hx    Stomach cancer Neg Hx    Problem                               Relation Asthma                                   No  Eczema                                No  Food allergy                          Sister  Allergic rhino conjunctivitis     No  Review of Systems  Constitutional:  Negative for appetite change, chills, fever and unexpected weight change.  HENT:  Negative for congestion and rhinorrhea.   Eyes:  Negative for itching.  Respiratory:  Negative for cough, chest tightness, shortness of breath and wheezing.   Cardiovascular:  Negative for chest pain.  Gastrointestinal:  Negative for abdominal pain.  Genitourinary:  Negative for difficulty urinating.  Skin:  Negative for rash.  Neurological:  Negative for headaches.   Objective: BP 126/82   Pulse 97   Temp 97.7 F (36.5 C)   Resp 18   Ht '5\' 5"'  (1.651 m)   Wt 146 lb 9.6 oz (66.5 kg)   LMP 07/13/2015 (Exact Date)   SpO2 98%   BMI 24.40 kg/m  Body mass index is 24.4 kg/m. Physical Exam Vitals  and nursing note reviewed.  Constitutional:      Appearance: Normal appearance. She is well-developed.  HENT:     Head: Normocephalic and atraumatic.     Right Ear: Tympanic membrane and external ear normal.     Left Ear: Tympanic membrane and external ear normal.     Nose: Nose normal.     Mouth/Throat:     Mouth: Mucous membranes are moist.     Pharynx: Oropharynx is clear.  Eyes:     Conjunctiva/sclera: Conjunctivae normal.  Cardiovascular:     Rate and Rhythm: Normal rate and regular rhythm.     Heart sounds: Normal heart sounds. No murmur heard.   No friction rub. No gallop.  Pulmonary:     Effort: Pulmonary effort is normal.     Breath sounds: Normal breath sounds. No wheezing, rhonchi or rales.  Musculoskeletal:     Cervical back: Neck supple.  Skin:    General: Skin is warm.     Findings: No rash.   Neurological:     Mental Status: She is alert and oriented to person, place, and time.  Psychiatric:        Behavior: Behavior normal.  The plan was reviewed with the patient/family, and all questions/concerned were addressed.  It was my pleasure to see [name] [name] today and participate in her care. Please feel free to contact me with any questions or concerns.  Sincerely,  Rexene Alberts, DO Allergy & Immunology  [facility] office: [place] office: [phone]

## 2021-06-17 NOTE — Patient Instructions (Addendum)
Today's skin testing showed: Negative to seafood including shrimp but positive control was negative questioning the validity of the results.  Food allergies Start strict avoidance of shellfish. I have prescribed epinephrine injectable device and demonstrated proper use. For mild symptoms you can take over the counter antihistamines such as Benadryl and monitor symptoms closely. If symptoms worsen or if you have severe symptoms including breathing issues, throat closure, significant swelling, whole body hives, severe diarrhea and vomiting, lightheadedness then inject epinephrine and seek immediate medical care afterwards. Emergency action plan given.  If interested we can schedule food challenge to shrimp. You must be off antihistamines for 3-5 days before. Must be in good health and not ill. No vaccines/injections within the past 7 days. Not on any antibiotics. Plan on being in the office for 2-3 hours and must bring in the food you want to do the oral challenge for - lightly seasoned 6-10 cooked shrimp. You must call to schedule an appointment and specify it's for a food challenge.   Penicillin allergy: Continue to avoid. Consider penicillin skin testing and in office drug challenge in the future.  You must be off antihistamines for 3-5 days before. Plan on being in the office for 2-3 hours and must bring in the medication we are doing the oral challenge for. You must call to schedule an appointment and specify it's for a drug challenge.   Rhinitis: Use over the counter antihistamines such as Zyrtec (cetirizine), Claritin (loratadine), Allegra (fexofenadine), or Xyzal (levocetirizine) daily as needed. May switch antihistamines every few months.  Follow up for food challenge and penicillin skin testing/challenge.

## 2021-06-17 NOTE — Assessment & Plan Note (Signed)
Mild rhinoconjunctivitis symptoms the spring and fall.  Takes Zyrtec as needed with good benefit.  No prior allergy testing.  Declines environmental allergy testing today as symptoms are minimal. . Use over the counter antihistamines such as Zyrtec (cetirizine), Claritin (loratadine), Allegra (fexofenadine), or Xyzal (levocetirizine) daily as needed. May switch antihistamines every few months.

## 2021-06-18 ENCOUNTER — Other Ambulatory Visit (HOSPITAL_COMMUNITY): Payer: Self-pay

## 2021-06-25 ENCOUNTER — Other Ambulatory Visit (HOSPITAL_COMMUNITY): Payer: Self-pay

## 2021-06-25 ENCOUNTER — Other Ambulatory Visit: Payer: Self-pay | Admitting: Neurology

## 2021-06-26 ENCOUNTER — Other Ambulatory Visit (HOSPITAL_COMMUNITY): Payer: Self-pay

## 2021-06-26 MED ORDER — METHYLPHENIDATE HCL ER (OSM) 36 MG PO TBCR
36.0000 mg | EXTENDED_RELEASE_TABLET | Freq: Every day | ORAL | 0 refills | Status: DC
  Filled 2021-06-26: qty 30, 30d supply, fill #0

## 2021-06-28 ENCOUNTER — Ambulatory Visit (HOSPITAL_BASED_OUTPATIENT_CLINIC_OR_DEPARTMENT_OTHER): Payer: 59 | Admitting: Internal Medicine

## 2021-06-28 ENCOUNTER — Other Ambulatory Visit: Payer: Self-pay

## 2021-06-28 VITALS — BP 129/86 | HR 69 | Ht 65.0 in | Wt 145.6 lb

## 2021-06-28 DIAGNOSIS — E782 Mixed hyperlipidemia: Secondary | ICD-10-CM

## 2021-06-28 NOTE — Progress Notes (Signed)
LIPID CLINIC CONSULT NOTE  Chief Complaint:  Manage dyslipidemia  Primary Care Physician: Josetta Huddle, MD  Primary Cardiologist:  None  HPI:  [name] [name] is a 50 y.o. female who is being seen today for the evaluation of dyslipidemia at the request of Josetta Huddle, MD. This is a pleasant female kindly referred by Dr. Inda Merlin for evaluation management of dyslipidemia.  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  Past medical history significant for possible multiple sclerosis, depression, anxiety and a skin cancer.  More recently she is noted to have had elevated cholesterol.  This was noted in her father, retired Herbalist, however he could not tolerate statins.  For recently, her total cholesterol was 211, HDL 48, LDL 137 and triglycerides 133.  Not previously been on statin therapy and is preferred to try to manage it with diet alone.  She does not have any additional cardiovascular risk factors and again no family history of early onset heart disease.  06/28/2021  Mrs. Oelkers returns today for follow-up.  I am pleased to report she had 0 coronary calcium and no significant extracardiac findings in the chest on her CT scan.  Overall this is associated with a low risk of cardiovascular events at least for the next 5 to possibly longer years.  Based on these findings, I think we could continue to work to try to lower cholesterol with dietary and lifestyle modifications.  I also mentioned that she consider some over-the-counter options such as red yeast rice or Colesoff, which can be uses supplements to help with her cholesterol lowering.  PMHx:  Past Medical History:  Diagnosis Date   Allergy    Angio-edema    Anxiety    Cancer ([place])    basal cell skin ca   Depression    Family history of adverse reaction to anesthesia    sister woke up during surgery age 20's for tonsillectomy   Multiple sclerosis ([place])    Neuromuscular disorder ([place])     multiple sclerosis   Vision abnormalities     Past Surgical History:  Procedure Laterality Date   ADENOIDECTOMY     adnoidectomy     BUNIONECTOMY Left    foot   CESAREAN SECTION     CHOLECYSTECTOMY     ROBOTIC ASSISTED LAPAROSCOPIC HYSTERECTOMY AND SALPINGECTOMY N/A 01/19/2018   Procedure: XI ROBOTIC ASSISTED LAPAROSCOPIC HYSTERECTOMY AND BILATERAL SALPINGECTOMY, LEFT OVARIAN CYSTECTOMY;  Surgeon: Everitt Amber, MD;  Location: [facility];  Service: Gynecology;  Laterality: N/A;   wisdom teeth exraction      FAMHx:  Family History  Problem Relation Age of Onset   Cervical cancer Mother    Dementia Mother    Melanoma Father    Ulcerative colitis Sister    Colon cancer Neg Hx    Esophageal cancer Neg Hx    Rectal cancer Neg Hx    Stomach cancer Neg Hx     SOCHx:   reports that she has never smoked. She has never used smokeless tobacco. She reports current alcohol use. She reports that she does not use drugs.  ALLERGIES:  Allergies  Allergen Reactions   Venlafaxine Hives    blisters   Augmentin [Amoxicillin-Pot Clavulanate] Rash    tongue swelling Has patient had a PCN reaction causing immediate rash, facial/tongue/throat swelling, SOB or lightheadedness with hypotension: No Has patient had a PCN reaction causing severe rash involving mucus membranes or skin necrosis: No Has patient had a  PCN reaction that required hospitalization: No Has patient had a PCN reaction occurring within the last 10 years: No If all of the above answers are "NO", then may proceed with Cephalosporin use.    Demerol [Meperidine] Swelling and Rash    Tongue swelling   Estrogens Rash    The patient can use patch,No oral   Gabapentin Rash   Stadol [Butorphanol] Swelling and Rash    Tongue swelling    ROS: Pertinent items noted in HPI and remainder of comprehensive ROS otherwise negative.  HOME MEDS: Current Outpatient Medications on File Prior to Visit  Medication Sig Dispense Refill    cetirizine (ZYRTEC) 10 MG tablet Take 10 mg by mouth at bedtime.      COVID-19 At Home Antigen Test [facility] COVID-19 HOME TEST) KIT Use as directed within the package instructions 4 each 0   cyclobenzaprine (FLEXERIL) 10 MG tablet Take 1 tablet (10 mg total) by mouth at bedtime as needed for muscle spasms 30 tablet 0   EPINEPHrine 0.3 mg/0.3 mL IJ SOAJ injection Inject 1 pen (0.3 mg) into the muscle as needed for anaphylaxis. 1 each 2   estradiol (VIVELLE-DOT) 0.05 MG/24HR patch APPLY 1 PATCH TO SKIN TWICE A WEEK. 24 patch 3   methylphenidate (CONCERTA) 36 MG PO CR tablet Take 1 tablet by mouth daily. 30 tablet 0   spironolactone (ALDACTONE) 50 MG tablet TAKE 1 TABLET BY MOUTH ONCE DAILY 90 tablet 2   tretinoin (RETIN-A) 0.025 % cream APPLY PEA SIZED AMOUNT EACH NIGHT TO CLEAN DRY FACE. 20 g 2   No current facility-administered medications on file prior to visit.    LABS/IMAGING: No results found for this or any previous visit (from the past 48 hour(s)). No results found.  LIPID PANEL: No results found for: CHOL, TRIG, HDL, CHOLHDL, VLDL, LDLCALC, LDLDIRECT  WEIGHTS: Wt Readings from Last 3 Encounters:  06/28/21 145 lb 9.6 oz (66 kg)  06/17/21 146 lb 9.6 oz (66.5 kg)  03/20/21 144 lb 12.8 oz (65.7 kg)    VITALS: BP 129/86   Pulse 69   Ht _0  (1.651 m)   Wt 145 lb 9.6 oz (66 kg)   LMP 07/13/2015 (Exact Date)   SpO2 98%   BMI 24.23 kg/m   EXAM: Deferred  EKG: Deferred  ASSESSMENT: Mixed dyslipidemia 0 coronary artery calcium (03/2021)  PLAN: 1.   Ms. Glasner had no evidence of coronary calcification.  This is a low risk study and a good negative predictive test.  Overall I would recommend continued work on diet and lifestyle to lower cholesterol and she could consider over-the-counter options as I had presented to her.  I am happy to see her back on an as-needed basis.  Pixie Casino, MD, [facility], [place] Director of the  Advanced Lipid Disorders &  Cardiovascular Risk Reduction Clinic Diplomate of the American Board of Clinical Lipidology Attending Cardiologist  Direct Dial: [phone]  Fax: [phone]  Website:  [email or web address] 06/28/2021, 11:01 AM

## 2021-06-28 NOTE — Patient Instructions (Signed)
Medication Instructions:  NO CHANGES  *If you need a refill on your cardiac medications before your next appointment, please call your pharmacy*   Follow Up: At [facility], you and your health needs are our priority.  As part of our continuing mission to provide you with exceptional heart care, we have created designated Provider Care Teams.  These Care Teams include your primary Cardiologist (physician) and Advanced Practice Providers (APPs -  Physician Assistants and Nurse Practitioners) who all work together to provide you with the care you need, when you need it.  We recommend signing up for the patient portal called "MyChart".  Sign up information is provided on this After Visit Summary.  MyChart is used to connect with patients for Virtual Visits (Telemedicine).  Patients are able to view lab/test results, encounter notes, upcoming appointments, etc.  Non-urgent messages can be sent to your provider as well.   To learn more about what you can do with MyChart, go to [email or web address].    Your next appointment:    AS NEEDED with Dr. Hilty  

## 2021-07-02 ENCOUNTER — Other Ambulatory Visit (HOSPITAL_COMMUNITY): Payer: Self-pay

## 2021-07-02 MED ORDER — INTRAROSA 6.5 MG VA INST
VAGINAL_INSERT | VAGINAL | 1 refills | Status: DC
  Filled 2021-07-02: qty 28, 30d supply, fill #0
  Filled 2022-04-17: qty 28, 30d supply, fill #1

## 2021-07-04 ENCOUNTER — Other Ambulatory Visit (HOSPITAL_COMMUNITY): Payer: Self-pay

## 2021-07-10 ENCOUNTER — Ambulatory Visit: Payer: 59 | Admitting: Family Medicine

## 2021-07-17 ENCOUNTER — Other Ambulatory Visit (HOSPITAL_COMMUNITY): Payer: Self-pay

## 2021-07-17 MED FILL — Estradiol TD Patch Twice Weekly 0.05 MG/24HR: TRANSDERMAL | 84 days supply | Qty: 24 | Fill #2 | Status: AC

## 2021-07-18 NOTE — Patient Instructions (Signed)
Below is our plan:  We will continue Concerta daily. Please let me know if you have any new or worsening symptoms  Please make sure you are staying well hydrated. I recommend 50-60 ounces daily. Well balanced diet and regular exercise encouraged. Consistent sleep schedule with 6-8 hours recommended.   Please continue follow up with care team as directed.   Follow up with me in 6 months   You may receive a survey regarding today's visit. I encourage you to leave honest feed back as I do use this information to improve patient care. Thank you for seeing me today!

## 2021-07-18 NOTE — Progress Notes (Signed)
Chief Complaint  Patient presents with   Follow-up    Rm 2, alone. Here for 6 month f/u. Pt reports doing well until last 2 weeks, pt has been feeling tired and having trouble swallowing at times. Continued L eye pn.      HISTORY OF PRESENT ILLNESS:  07/22/21 ALL:  [name] [name] is a 50 y.o. female here today for follow up for history of optic neuritis. She has been followed for possible MS. Not on DMT. Last MRI in 2019 negative for demyelinating lesions.   She reports that she has been doing really well until two weeks ago. No new or worsening symptoms but she does state that she has been tired and sluggish for the past two weeks. She has had left eye pain. She feels that gait has been a little off. No falls. She feels it is harder to swallow foods. She reports all of these symptoms usually come and go but do not normally linger for 2 weeks at a time. She does endorse more stress. Her mother is in a SNF for dementia and was recently admitted to Hospice. She was caught off guard and not expecting that conversation. She does have a good support system. She denies any significant depression.   Concerta helps with attention and fatigue. She does not take every day. Last refill 06/26/21. PDMP appropriate. She is sleeping well. No changes in bladder or bowel functioning. Intermittent incontinence persists. No vision changes. No new numbness or weakness.   HISTORY (copied from Dr Garth Bigness previous note)  [name] [name] is a 50 y.o. woman with a history of optic neuritis and who has undergone evaluation for possible multiple sclerosis.   Update 01/04/2021:  She is generally doing well.    No visual problems but she notes mild left eye pain at times (has been off/on since the ON).    She had left optic neuritis in 2009. She also has had urinary incontinence but that is doing well now.  She occasionally has numbness when she gets a massage but not at other times.  Gait is fine.       MRI of the  brain and spine 06/23/2018.  There was no evidence of demyelination or myelopathy.  She has mild spinal stenosis at [facility]   ADD is doing well on Concerta 36 mg .  She feels the medciation holds her throughout the day.   She does not have insomnia      She is sleeping 8-9 hours and feels refreshed when she wakes up.   She was diagnosed with ADD as a child but did not receive medications.       Optic neuritis history:  In 2009, she lost vision in her left eye.   She had a black central dot and blurriness around that.    She felt symptoms resolved within an hour but was back the next day.    She had a similar recurrence the next day.    She saw ophthalmology (Dr. Zadie Rhine) and was diagnosed with optic neuritis.    MRI's of the brain and cervical spine were normal.     She had numbness in her face shortly later.    She was referred to Dr. Hart Robinsons and then another doctor at [facility].    MS was not confirmed.   She saw Dr. Jacqulynn Cadet who felt she probably had MS but did not start a DMT.    She then had an episode of left leg  numbness and left foot drop that comes and goes for a day or two and still does a couple times a year.    She also has a lot of fatigue (physical > cognitive).   When this occurs, she is more likely to have an episode involving the left leg.   She has had a couple more episodes of blurry vision but none the past year.   She has had steroids (starting with 200 mg and tapering down over a week or so) about twice a year.   The last time was summer 2018.  Some episodes are associated with her gait worsening and even her speech being off.     She always feels better for a few months afterwards.     She is on Ritalin 10 mg po bid and that usually helps her fatigue.  She sleeps well most every night.   She denies any major problems with mood.     Studies:  She was initially evaluated by Dr. Erling Cruz and had MRI studies, evoked potential studies CSF analysis (no oligoclonal bands and IgG index was normal)  and blood work including NMO, anti-cardiolipin antibody, ANA, Ace, RPR, ESR, B12, Lyme. The blood work was normal or negative.   MRI of the brain and spine 06/23/2018:  There was no evidence of demyelination or myelopathy.  She has mild spinal stenosis at C5C6   MRIs of the brain dated 05/10/2017 month 08/14/2014 and 05/10/2008 an MRI of the cervical spine from 12/16/2016 and MRI the thoracic spine from 08/16/2008. There were no demyelinating lesions in any of the MRIs   REVIEW OF SYSTEMS: Out of a complete 14 system review of symptoms, the patient complains only of the following symptoms, left lower weakness, fatigue, attention deficit, and all other reviewed systems are negative.   ALLERGIES: Allergies  Allergen Reactions   Venlafaxine Hives    blisters   Augmentin [Amoxicillin-Pot Clavulanate] Rash    tongue swelling Has patient had a PCN reaction causing immediate rash, facial/tongue/throat swelling, SOB or lightheadedness with hypotension: No Has patient had a PCN reaction causing severe rash involving mucus membranes or skin necrosis: No Has patient had a PCN reaction that required hospitalization: No Has patient had a PCN reaction occurring within the last 10 years: No If all of the above answers are "NO", then may proceed with Cephalosporin use.    Demerol [Meperidine] Swelling and Rash    Tongue swelling   Estrogens Rash    The patient can use patch,No oral   Gabapentin Rash   Stadol [Butorphanol] Swelling and Rash    Tongue swelling     HOME MEDICATIONS: Outpatient Medications Prior to Visit  Medication Sig Dispense Refill   cetirizine (ZYRTEC) 10 MG tablet Take 10 mg by mouth at bedtime.      COVID-19 At Home Antigen Test [facility] COVID-19 HOME TEST) KIT Use as directed within the package instructions 4 each 0   cyclobenzaprine (FLEXERIL) 10 MG tablet Take 1 tablet (10 mg total) by mouth at bedtime as needed for muscle spasms 30 tablet 0   EPINEPHrine 0.3 mg/0.3 mL  IJ SOAJ injection Inject 1 pen (0.3 mg) into the muscle as needed for anaphylaxis. 1 each 2   estradiol (VIVELLE-DOT) 0.05 MG/24HR patch APPLY 1 PATCH TO SKIN TWICE A WEEK. 24 patch 3   methylphenidate (CONCERTA) 36 MG PO CR tablet Take 1 tablet by mouth daily. 30 tablet 0   Prasterone (INTRAROSA) 6.5 MG INST INSERT 1 VAGINAL INSERT  EVERY DAY AS DIRECTED 28 each 1   spironolactone (ALDACTONE) 50 MG tablet TAKE 1 TABLET BY MOUTH ONCE DAILY 90 tablet 2   tretinoin (RETIN-A) 0.025 % cream APPLY PEA SIZED AMOUNT EACH NIGHT TO CLEAN DRY FACE. 20 g 2   No facility-administered medications prior to visit.     PAST MEDICAL HISTORY: Past Medical History:  Diagnosis Date   Allergy    Angio-edema    Anxiety    Cancer ([place])    basal cell skin ca   Depression    Family history of adverse reaction to anesthesia    sister woke up during surgery age 63's for tonsillectomy   Multiple sclerosis ([place])    Neuromuscular disorder ([place])    multiple sclerosis   Vision abnormalities      PAST SURGICAL HISTORY: Past Surgical History:  Procedure Laterality Date   ADENOIDECTOMY     adnoidectomy     BUNIONECTOMY Left    foot   CESAREAN SECTION     CHOLECYSTECTOMY     ROBOTIC ASSISTED LAPAROSCOPIC HYSTERECTOMY AND SALPINGECTOMY N/A 01/19/2018   Procedure: XI ROBOTIC ASSISTED LAPAROSCOPIC HYSTERECTOMY AND BILATERAL SALPINGECTOMY, LEFT OVARIAN CYSTECTOMY;  Surgeon: Everitt Amber, MD;  Location: [facility];  Service: Gynecology;  Laterality: N/A;   wisdom teeth exraction       FAMILY HISTORY: Family History  Problem Relation Age of Onset   Cervical cancer Mother    Dementia Mother    Melanoma Father    Ulcerative colitis Sister    Colon cancer Neg Hx    Esophageal cancer Neg Hx    Rectal cancer Neg Hx    Stomach cancer Neg Hx      SOCIAL HISTORY: Social History   Socioeconomic History   Marital status: Divorced    Spouse name: Not on file   Number of children: 2   Years of education: Not on  file   Highest education level: Not on file  Occupational History   Occupation: full time  Tobacco Use   Smoking status: Never   Smokeless tobacco: Never  Vaping Use   Vaping Use: Never used  Substance and Sexual Activity   Alcohol use: Yes    Alcohol/week: 0.0 standard drinks    Comment: rarely   Drug use: No   Sexual activity: Yes  Other Topics Concern   Not on file  Social History Narrative   Lives with son and daughter   Right Handed   Drinks 5 cups caffeine daily   Social Determinants of Health   Financial Resource Strain: Not on file  Food Insecurity: Not on file  Transportation Needs: Not on file  Physical Activity: Not on file  Stress: Not on file  Social Connections: Not on file  Intimate Partner Violence: Not on file     PHYSICAL EXAM  Vitals:   07/22/21 1414  BP: 138/88  Pulse: 74  Weight: 148 lb (67.1 kg)  Height: '5\' 4"'  (1.626 m)   Body mass index is 25.4 kg/m.  Generalized: Well developed, in no acute distress  Cardiology: normal rate and rhythm, no murmur auscultated  Respiratory: clear to auscultation bilaterally    Neurological examination  Mentation: Alert oriented to time, place, history taking. Follows all commands speech and language fluent Cranial nerve II-XII: Pupils were equal round reactive to light. Extraocular movements were full, visual field were full on confrontational test. Facial sensation and strength were normal. Head turning and shoulder shrug  were normal and symmetric. Motor: The motor  testing reveals 5 over 5 strength of all 4 extremities with exception of 4++/5 left hip flexion. Good symmetric motor tone is noted throughout.  Sensory: Sensory testing is intact to soft touch on all 4 extremities. No evidence of extinction is noted.  Coordination: Cerebellar testing reveals good finger-nose-finger and heel-to-shin bilaterally.  Gait and station: Gait is normal. Tandem gait is normal.  Reflexes: Deep tendon reflexes are  symmetric and normal bilaterally.    DIAGNOSTIC DATA (LABS, IMAGING, TESTING) - I reviewed patient records, labs, notes, testing and imaging myself where available.  Lab Results  Component Value Date   WBC 6.9 03/25/2018   HGB 14.7 03/25/2018   HCT 43.4 03/25/2018   MCV 88.9 03/25/2018   PLT 317 03/25/2018      Component Value Date/Time   NA 142 03/25/2018 1504   NA 143 10/12/2017 0947   K 3.8 03/25/2018 1504   CL 104 03/25/2018 1504   CO2 29 03/25/2018 1504   GLUCOSE 95 03/25/2018 1504   BUN 17 03/25/2018 1504   BUN 14 10/12/2017 0947   CREATININE 0.77 03/25/2018 1504   CALCIUM 11.1 (H) 03/25/2018 1504   PROT 7.6 01/13/2018 1331   PROT 7.1 10/12/2017 0947   ALBUMIN 4.3 01/13/2018 1331   ALBUMIN 4.6 10/12/2017 0947   AST 18 01/13/2018 1331   ALT 24 01/13/2018 1331   ALKPHOS 71 01/13/2018 1331   BILITOT 0.4 01/13/2018 1331   BILITOT 0.3 10/12/2017 0947   GFRNONAA >60 03/25/2018 1504   GFRAA >60 03/25/2018 1504   No results found for: CHOL, HDL, LDLCALC, LDLDIRECT, TRIG, CHOLHDL No results found for: HGBA1C No results found for: VITAMINB12 Lab Results  Component Value Date   TSH 1.810 10/12/2017    No flowsheet data found.   No flowsheet data found.   ASSESSMENT AND PLAN  50 y.o. year old female  has a past medical history of Allergy, Angio-edema, Anxiety, Cancer ([place]), Depression, Family history of adverse reaction to anesthesia, Multiple sclerosis ([place]), Neuromuscular disorder ([place]), and Vision abnormalities. here with    Optic neuritis  Attention deficit disorder, unspecified hyperactivity presence  Other fatigue  Gait disturbance  Pain of left orbit  Anessia reports that she has noted more waxing and waning of previous symptoms for the past two weeks. She does endorse more stress related to mother's recent admission to hospice. Last MRI was 2019. We have discussed updating imaging. I am reassured by her neuro exam, today. No new or worsening symptoms.  She will continue to monitor closely. She will follow up with me immediately for any new symptoms. We can update imaging should current symptoms not improve in the next 1-2 weeks. Healthy lifestyle habits encouraged. She declines offer to refill Concerta and states she will call pharmacy when she needs refill. She will follow up with me in 6 months, sooner if needed.   No orders of the defined types were placed in this encounter.    No orders of the defined types were placed in this encounter.     Debbora Presto, MSN, FNP-C 07/22/2021, 2:50 PM  [facility] [address] [place], [place] [address] [phone]

## 2021-07-22 ENCOUNTER — Ambulatory Visit: Payer: 59 | Admitting: Family Medicine

## 2021-07-22 ENCOUNTER — Encounter: Payer: Self-pay | Admitting: Family Medicine

## 2021-07-22 VITALS — BP 138/88 | HR 74 | Ht 64.0 in | Wt 148.0 lb

## 2021-07-22 DIAGNOSIS — R5383 Other fatigue: Secondary | ICD-10-CM

## 2021-07-22 DIAGNOSIS — H5712 Ocular pain, left eye: Secondary | ICD-10-CM | POA: Diagnosis not present

## 2021-07-22 DIAGNOSIS — H469 Unspecified optic neuritis: Secondary | ICD-10-CM

## 2021-07-22 DIAGNOSIS — F988 Other specified behavioral and emotional disorders with onset usually occurring in childhood and adolescence: Secondary | ICD-10-CM | POA: Diagnosis not present

## 2021-07-22 DIAGNOSIS — R269 Unspecified abnormalities of gait and mobility: Secondary | ICD-10-CM | POA: Diagnosis not present

## 2021-08-07 DIAGNOSIS — Z113 Encounter for screening for infections with a predominantly sexual mode of transmission: Secondary | ICD-10-CM | POA: Diagnosis not present

## 2021-08-07 DIAGNOSIS — Z6824 Body mass index (BMI) 24.0-24.9, adult: Secondary | ICD-10-CM | POA: Diagnosis not present

## 2021-08-07 DIAGNOSIS — Z01419 Encounter for gynecological examination (general) (routine) without abnormal findings: Secondary | ICD-10-CM | POA: Diagnosis not present

## 2021-08-19 DIAGNOSIS — R310 Gross hematuria: Secondary | ICD-10-CM | POA: Diagnosis not present

## 2021-08-19 DIAGNOSIS — R1084 Generalized abdominal pain: Secondary | ICD-10-CM | POA: Diagnosis not present

## 2021-08-21 ENCOUNTER — Other Ambulatory Visit: Payer: Self-pay | Admitting: Neurology

## 2021-08-21 ENCOUNTER — Other Ambulatory Visit (HOSPITAL_COMMUNITY): Payer: Self-pay

## 2021-08-21 MED ORDER — METHYLPHENIDATE HCL ER (OSM) 36 MG PO TBCR
36.0000 mg | EXTENDED_RELEASE_TABLET | Freq: Every day | ORAL | 0 refills | Status: DC
  Filled 2021-08-21: qty 30, 30d supply, fill #0

## 2021-08-22 ENCOUNTER — Other Ambulatory Visit (HOSPITAL_COMMUNITY): Payer: Self-pay

## 2021-08-30 ENCOUNTER — Ambulatory Visit (HOSPITAL_COMMUNITY)
Admission: RE | Admit: 2021-08-30 | Discharge: 2021-08-30 | Disposition: A | Discharge status: Home / Self Care | Payer: 59 | Source: Ambulatory Visit | Attending: Emergency Medicine | Admitting: Emergency Medicine

## 2021-08-30 ENCOUNTER — Other Ambulatory Visit: Payer: Self-pay

## 2021-08-30 ENCOUNTER — Encounter (HOSPITAL_COMMUNITY): Payer: Self-pay

## 2021-08-30 VITALS — BP 139/85 | HR 86 | Temp 98.4°F | Resp 17

## 2021-08-30 DIAGNOSIS — J069 Acute upper respiratory infection, unspecified: Secondary | ICD-10-CM | POA: Diagnosis not present

## 2021-08-30 MED ORDER — PROMETHAZINE-DM 6.25-15 MG/5ML PO SYRP: 5.0000 mL | ORAL_SOLUTION | Freq: Four times a day (QID) | ORAL | 0 refills | Status: AC | PRN

## 2021-08-30 NOTE — Discharge Instructions (Addendum)
Rest, push fluids, take cough medicine as directed.  Follow-up with your PCP if no improvement next week.  Go to ER if you develop shortness of breath worsening cough chest pain etc.

## 2021-08-30 NOTE — ED Provider Notes (Signed)
[place]    CSN: [id number] Arrival date & time: 08/30/21  1432      History   Chief Complaint Chief Complaint  Patient presents with   Fever    APPT   Cough   Headache   Generalized Body Aches    HPI [name] [name] is a 50 y.o. female.   50 year old female, [name] [name], presents to urgent care chief complaint of cough, headache, fever,body aches for 3 days.  Patient states she does get some relief with over-the-counter medicine but just does "not feel well".  Patient works as a Marine scientist, has been [place] and flu vaccinated.  Unknown illness exposure .Patient states she is having spasmatic coughs requesting cough med.  PMH: Suspicion for possible MS, denies drinking, smoking, drug use.  The history is provided by the patient. No language interpreter was used.   Past Medical History:  Diagnosis Date   Allergy    Angio-edema    Anxiety    Cancer ([place])    basal cell skin ca   Depression    Family history of adverse reaction to anesthesia    sister woke up during surgery age 35's for tonsillectomy   Multiple sclerosis ([place])    Neuromuscular disorder ([place])    multiple sclerosis   Vision abnormalities     Patient Active Problem List   Diagnosis Date Noted   Adverse food reaction 06/17/2021   Allergic reaction 06/17/2021   Chronic rhinitis 06/17/2021   Multiple drug allergies 06/17/2021   Attention deficit disorder 01/04/2021   Neck pain 05/10/2019   Pain of left orbit 05/10/2019   Fecal incontinence 05/31/2018   Adenomyosis 01/19/2018   Abnormal uterine bleeding (AUB) 01/19/2018   Abnormal uterine bleeding 01/19/2018   Spider veins of both lower extremities 11/02/2017   Optic neuritis 10/12/2017   Other fatigue 10/12/2017   Numbness 10/12/2017   Gait disturbance 10/12/2017    Past Surgical History:  Procedure Laterality Date   ADENOIDECTOMY     adnoidectomy     BUNIONECTOMY Left    foot   CESAREAN SECTION     CHOLECYSTECTOMY     ROBOTIC  ASSISTED LAPAROSCOPIC HYSTERECTOMY AND SALPINGECTOMY N/A 01/19/2018   Procedure: XI ROBOTIC ASSISTED LAPAROSCOPIC HYSTERECTOMY AND BILATERAL SALPINGECTOMY, LEFT OVARIAN CYSTECTOMY;  Surgeon: Everitt Amber, MD;  Location: [facility];  Service: Gynecology;  Laterality: N/A;   wisdom teeth exraction      OB History   No obstetric history on file.      Home Medications    Prior to Admission medications   Medication Sig Start Date End Date Taking? Authorizing Provider  promethazine-dextromethorphan (PROMETHAZINE-DM) 6.25-15 MG/5ML syrup Take 5 mLs by mouth 4 (four) times daily as needed for up to 5 days for cough. 08/30/21 85/46/27 Yes Mahamadou Weltz, Jeanett Schlein, NP  cetirizine (ZYRTEC) 10 MG tablet Take 10 mg by mouth at bedtime.     [provider]  COVID-19 At Home Antigen Test [facility] COVID-19 HOME TEST) KIT Use as directed within the package instructions 03/01/21   Edmon Crape, RPH  cyclobenzaprine (FLEXERIL) 10 MG tablet Take 1 tablet (10 mg total) by mouth at bedtime as needed for muscle spasms 05/15/21     EPINEPHrine 0.3 mg/0.3 mL IJ SOAJ injection Inject 1 pen (0.3 mg) into the muscle as needed for anaphylaxis. 06/17/21   Garnet Sierras, DO  estradiol (VIVELLE-DOT) 0.05 MG/24HR patch APPLY 1 PATCH TO SKIN TWICE A WEEK. 11/08/20 11/08/21  Molli Posey, MD  methylphenidate (CONCERTA)  36 MG PO CR tablet Take 1 tablet by mouth daily. 08/21/21   Sater, Nanine Means, MD  Prasterone (INTRAROSA) 6.5 MG INST INSERT 1 VAGINAL INSERT EVERY DAY AS DIRECTED 07/02/21     spironolactone (ALDACTONE) 50 MG tablet TAKE 1 TABLET BY MOUTH ONCE DAILY 12/19/20       Family History Family History  Problem Relation Age of Onset   Cervical cancer Mother    Dementia Mother    Melanoma Father    Ulcerative colitis Sister    Colon cancer Neg Hx    Esophageal cancer Neg Hx    Rectal cancer Neg Hx    Stomach cancer Neg Hx     Social History Social History   Tobacco Use   Smoking status: Never   Smokeless  tobacco: Never  Vaping Use   Vaping Use: Never used  Substance Use Topics   Alcohol use: Yes    Alcohol/week: 0.0 standard drinks    Comment: rarely   Drug use: No     Allergies   Venlafaxine, Augmentin [amoxicillin-pot clavulanate], Demerol [meperidine], Estrogens, Gabapentin, and Stadol [butorphanol]   Review of Systems Review of Systems  Constitutional:  Positive for fever.  HENT:  Positive for congestion.   Respiratory:  Positive for cough. Negative for shortness of breath.   Cardiovascular:  Negative for chest pain and palpitations.  Neurological:  Positive for headaches.  All other systems reviewed and are negative.   Physical Exam Triage Vital Signs ED Triage Vitals  Enc Vitals Group     BP 08/30/21 1454 139/85     Pulse Rate 08/30/21 1454 86     Resp 08/30/21 1454 17     Temp 08/30/21 1454 98.4 F (36.9 C)     Temp Source 08/30/21 1454 Oral     SpO2 08/30/21 1454 98 %     Weight --      Height --      Head Circumference --      Peak Flow --      Pain Score 08/30/21 1453 4     Pain Loc --      Pain Edu? --      Excl. in [place]? --    No data found.  Updated Vital Signs BP 139/85 (BP Location: Right Arm)    Pulse 86    Temp 98.4 F (36.9 C) (Oral)    Resp 17    LMP 07/13/2015 (Exact Date)    SpO2 98%   Visual Acuity Right Eye Distance:   Left Eye Distance:   Bilateral Distance:    Right Eye Near:   Left Eye Near:    Bilateral Near:     Physical Exam Vitals and nursing note reviewed.  Constitutional:      General: She is not in acute distress.    Appearance: She is well-developed.  HENT:     Head: Normocephalic.     Right Ear: Tympanic membrane is retracted.     Left Ear: Tympanic membrane is retracted.     Nose: Congestion present.     Mouth/Throat:     Lips: Pink.     Mouth: Mucous membranes are moist.     Pharynx: Oropharynx is clear.  Eyes:     General: Lids are normal.     Conjunctiva/sclera: Conjunctivae normal.     Pupils: Pupils  are equal, round, and reactive to light.  Neck:     Trachea: No tracheal deviation.  Cardiovascular:     Rate  and Rhythm: Normal rate and regular rhythm.     Pulses: Normal pulses.     Heart sounds: Normal heart sounds. No murmur heard. Pulmonary:     Effort: Pulmonary effort is normal.     Breath sounds: Normal breath sounds and air entry.  Abdominal:     General: Bowel sounds are normal.     Palpations: Abdomen is soft.     Tenderness: There is no abdominal tenderness.  Musculoskeletal:        General: Normal range of motion.     Cervical back: Normal range of motion.  Lymphadenopathy:     Cervical: No cervical adenopathy.  Skin:    General: Skin is warm and dry.     Findings: No rash.  Neurological:     General: No focal deficit present.     Mental Status: She is alert and oriented to person, place, and time.     GCS: GCS eye subscore is 4. GCS verbal subscore is 5. GCS motor subscore is 6.  Psychiatric:        Attention and Perception: Attention normal.        Mood and Affect: Mood normal.        Speech: Speech normal.        Behavior: Behavior normal. Behavior is cooperative.     UC Treatments / Results  Labs (all labs ordered are listed, but only abnormal results are displayed) Labs Reviewed - No data to display  EKG   Radiology No results found.  Procedures Procedures (including critical care time)  Medications Ordered in UC Medications - No data to display  Initial Impression / Assessment and Plan / UC Course  I have reviewed the triage vital signs and the nursing notes.  Pertinent labs & imaging results that were available during my care of the patient were reviewed by me and considered in my medical decision making (see chart for details).     Ddx: Viral URI w cough, allergies Final Clinical Impressions(s) / UC Diagnoses   Final diagnoses:  Viral URI with cough     Discharge Instructions      Rest, push fluids, take cough medicine as  directed.  Follow-up with your PCP if no improvement next week.  Go to ER if you develop shortness of breath worsening cough chest pain etc.     ED Prescriptions     Medication Sig Dispense Auth. Provider   promethazine-dextromethorphan (PROMETHAZINE-DM) 6.25-15 MG/5ML syrup Take 5 mLs by mouth 4 (four) times daily as needed for up to 5 days for cough. 505 mL Eddi Hymes, Jeanett Schlein, NP      PDMP not reviewed this encounter.   Tori Milks, NP 39/76/73 [id number]

## 2021-08-30 NOTE — ED Triage Notes (Signed)
Pt presents with cough, headache, fever and body aches xs 3 days. States gets some relief with otc medications.

## 2021-09-05 ENCOUNTER — Other Ambulatory Visit (HOSPITAL_COMMUNITY): Payer: Self-pay

## 2021-09-05 DIAGNOSIS — R051 Acute cough: Secondary | ICD-10-CM | POA: Diagnosis not present

## 2021-09-05 MED ORDER — CEFDINIR 300 MG PO CAPS
300.0000 mg | ORAL_CAPSULE | Freq: Two times a day (BID) | ORAL | 0 refills | Status: DC
  Filled 2021-09-05: qty 20, 10d supply, fill #0

## 2021-09-05 MED ORDER — CYCLOBENZAPRINE HCL 10 MG PO TABS
ORAL_TABLET | ORAL | 0 refills | Status: DC
  Filled 2021-09-05: qty 30, 30d supply, fill #0

## 2021-10-02 ENCOUNTER — Other Ambulatory Visit (HOSPITAL_COMMUNITY): Payer: Self-pay

## 2021-10-02 DIAGNOSIS — R310 Gross hematuria: Secondary | ICD-10-CM | POA: Diagnosis not present

## 2021-10-02 MED ORDER — CIPROFLOXACIN HCL 500 MG PO TABS
ORAL_TABLET | ORAL | 0 refills | Status: DC
  Filled 2021-10-02: qty 1, 1d supply, fill #0

## 2021-10-02 MED ORDER — TRAMADOL HCL 50 MG PO TABS
ORAL_TABLET | ORAL | 0 refills | Status: DC
  Filled 2021-10-02: qty 15, 3d supply, fill #0

## 2021-10-02 MED ORDER — PHENAZOPYRIDINE HCL 200 MG PO TABS
ORAL_TABLET | ORAL | 0 refills | Status: DC
  Filled 2021-10-02: qty 10, 4d supply, fill #0

## 2021-10-17 ENCOUNTER — Encounter: Payer: Self-pay | Admitting: Family Medicine

## 2021-10-22 ENCOUNTER — Other Ambulatory Visit (HOSPITAL_COMMUNITY): Payer: Self-pay

## 2021-10-22 MED ORDER — ESTRADIOL 0.05 MG/24HR TD PTTW
MEDICATED_PATCH | TRANSDERMAL | 3 refills | Status: DC
  Filled 2021-10-22: qty 24, 84d supply, fill #0
  Filled 2022-01-09: qty 24, 84d supply, fill #1
  Filled 2022-04-28: qty 24, 84d supply, fill #2
  Filled 2022-07-22: qty 24, 84d supply, fill #3

## 2021-11-04 ENCOUNTER — Ambulatory Visit: Payer: 59 | Admitting: Family Medicine

## 2021-11-13 ENCOUNTER — Other Ambulatory Visit (HOSPITAL_COMMUNITY): Payer: Self-pay

## 2021-11-13 ENCOUNTER — Other Ambulatory Visit: Payer: Self-pay | Admitting: Neurology

## 2021-11-13 MED ORDER — METHYLPHENIDATE HCL ER (OSM) 36 MG PO TBCR
36.0000 mg | EXTENDED_RELEASE_TABLET | Freq: Every day | ORAL | 0 refills | Status: DC
  Filled 2021-11-13: qty 30, 30d supply, fill #0

## 2021-11-13 NOTE — Telephone Encounter (Signed)
Last OV was on 07/22/21.  ?Next OV is scheduled for 01/20/22 .  ?Last RX was written on 08/22/21 for 30 tabs.  ? ? Drug Database has been reviewed.  ?

## 2021-11-14 ENCOUNTER — Other Ambulatory Visit (HOSPITAL_COMMUNITY): Payer: Self-pay

## 2021-12-26 ENCOUNTER — Other Ambulatory Visit (HOSPITAL_COMMUNITY): Payer: Self-pay

## 2021-12-26 MED ORDER — TOPIRAMATE 25 MG PO CPSP
25.0000 mg | ORAL_CAPSULE | Freq: Every evening | ORAL | 0 refills | Status: AC
  Filled 2021-12-26: qty 270, 90d supply, fill #0

## 2021-12-27 ENCOUNTER — Other Ambulatory Visit (HOSPITAL_COMMUNITY): Payer: Self-pay

## 2022-01-09 ENCOUNTER — Other Ambulatory Visit (HOSPITAL_COMMUNITY): Payer: Self-pay

## 2022-01-09 ENCOUNTER — Other Ambulatory Visit: Payer: Self-pay | Admitting: Neurology

## 2022-01-09 MED ORDER — METHYLPHENIDATE HCL ER (OSM) 36 MG PO TBCR
36.0000 mg | EXTENDED_RELEASE_TABLET | Freq: Every day | ORAL | 0 refills | Status: DC
  Filled 2022-01-09: qty 30, 30d supply, fill #0

## 2022-01-10 ENCOUNTER — Other Ambulatory Visit (HOSPITAL_COMMUNITY): Payer: Self-pay

## 2022-01-20 ENCOUNTER — Ambulatory Visit: Payer: 59 | Admitting: Family Medicine

## 2022-01-20 ENCOUNTER — Encounter: Payer: Self-pay | Admitting: Family Medicine

## 2022-01-20 VITALS — BP 132/89 | HR 78 | Ht 64.0 in | Wt 146.0 lb

## 2022-01-20 DIAGNOSIS — G43109 Migraine with aura, not intractable, without status migrainosus: Secondary | ICD-10-CM

## 2022-01-20 DIAGNOSIS — F988 Other specified behavioral and emotional disorders with onset usually occurring in childhood and adolescence: Secondary | ICD-10-CM | POA: Diagnosis not present

## 2022-01-20 DIAGNOSIS — H469 Unspecified optic neuritis: Secondary | ICD-10-CM

## 2022-01-20 DIAGNOSIS — Z1231 Encounter for screening mammogram for malignant neoplasm of breast: Secondary | ICD-10-CM | POA: Diagnosis not present

## 2022-01-20 NOTE — Progress Notes (Signed)
? ? ?Chief Complaint  ?Patient presents with  ? Follow-up  ?  Rm 2, alone. Here to f/u for optic neuritis. Pt has been having HA. One that last for 1 week. Had visual changes over L eye, half of eye (lateral side) looked like a waterfall.   ? ? ? ?HISTORY OF PRESENT ILLNESS: ? ?01/20/22 ALL:  ?[name] [name] returns for follow up for history of optic neuritis. She reports doing well. She has had more headaches recently. She did have a really bad headache in 10/2021 that was associated with visual disturbances. She talked to her opthalmologic who was not concerned and told her to monitor. She was started on topiramate 25-48m daily at bedtime. She has taken 248mnightly with no headaches since. No other sensory or motor changes. She continues Concerta. She does not take every day. Last fill 01/10/2022 for #30. She is sleeping well. She continues to help care for her mother in Hospice care and her sister with cancer. Appetite is good. She tries to exercise for about 30 minutes 4 times a week.  ? ?07/22/2021 ALL:  ?[name] [name] a 51.o. female here today for follow up for history of optic neuritis. She has been followed for possible MS. Not on DMT. Last MRI in 2019 negative for demyelinating lesions.  ? ?She reports that she has been doing really well until two weeks ago. No new or worsening symptoms but she does state that she has been tired and sluggish for the past two weeks. She has had left eye pain. She feels that gait has been a little off. No falls. She feels it is harder to swallow foods. She reports all of these symptoms usually come and go but do not normally linger for 2 weeks at a time. She does endorse more stress. Her mother is in a SNF for dementia and was recently admitted to Hospice. She was caught off guard and not expecting that conversation. She does have a good support system. She denies any significant depression.  ? ?Concerta helps with attention and fatigue. She does not take every day. Last refill  06/26/21. PDMP appropriate. She is sleeping well. No changes in bladder or bowel functioning. Intermittent incontinence persists. No vision changes. No new numbness or weakness.  ? ?HISTORY (copied from Dr SaGarth Bignessrevious note) ? ?[name] [name] a 51.o. woman with a history of optic neuritis and who has undergone evaluation for possible multiple sclerosis. ?  ?Update 01/04/2021:  ?She is generally doing well.    No visual problems but she notes mild left eye pain at times (has been off/on since the ON).    She had left optic neuritis in 2009. She also has had urinary incontinence but that is doing well now.  She occasionally has numbness when she gets a massage but not at other times.  Gait is fine.     ?  ?MRI of the brain and spine 06/23/2018.  There was no evidence of demyelination or myelopathy.  She has mild spinal stenosis at C5C6 ?  ?ADD is doing well on Concerta 36 mg .  She feels the medciation holds her throughout the day.   She does not have insomnia      She is sleeping 8-9 hours and feels refreshed when she wakes up.   She was diagnosed with ADD as a child but did not receive medications.   ?  ?  ?Optic neuritis history:  ?In 2009, she lost vision in  her left eye.   She had a black central dot and blurriness around that.    She felt symptoms resolved within an hour but was back the next day.    She had a similar recurrence the next day.    She saw ophthalmology (Dr. Zadie Rhine) and was diagnosed with optic neuritis.    MRI's of the brain and cervical spine were normal.     She had numbness in her face shortly later.    She was referred to Dr. Hart Robinsons and then another doctor at [facility]    MS was not confirmed.   She saw Dr. Jacqulynn Cadet who felt she probably had MS but did not start a DMT.    She then had an episode of left leg numbness and left foot drop that comes and goes for a day or two and still does a couple times a year.    She also has a lot of fatigue (physical > cognitive).   When this occurs,  she is more likely to have an episode involving the left leg.   She has had a couple more episodes of blurry vision but none the past year.   She has had steroids (starting with 200 mg and tapering down over a week or so) about twice a year.   The last time was summer 2018.  Some episodes are associated with her gait worsening and even her speech being off.     She always feels better for a few months afterwards.     She is on Ritalin 10 mg po bid and that usually helps her fatigue.  She sleeps well most every night.   She denies any major problems with mood.   ?  ?Studies:  ?She was initially evaluated by Dr. Erling Cruz and had MRI studies, evoked potential studies CSF analysis (no oligoclonal bands and IgG index was normal) and blood work including NMO, anti-cardiolipin antibody, ANA, Ace, RPR, ESR, B12, Lyme. The blood work was normal or negative. ?  ?MRI of the brain and spine 06/23/2018:  There was no evidence of demyelination or myelopathy.  She has mild spinal stenosis at C5C6 ?  ?MRIs of the brain dated 05/10/2017 month 08/14/2014 and 05/10/2008 an MRI of the cervical spine from 12/16/2016 and MRI the thoracic spine from 08/16/2008. There were no demyelinating lesions in any of the MRIs ? ? ?REVIEW OF SYSTEMS: Out of a complete 14 system review of symptoms, the patient complains only of the following symptoms, headaches and all other reviewed systems are negative. ? ? ?ALLERGIES: ?Allergies  ?Allergen Reactions  ? Venlafaxine Hives  ?  blisters  ? Augmentin [Amoxicillin-Pot Clavulanate] Rash  ?  tongue swelling ?Has patient had a PCN reaction causing immediate rash, facial/tongue/throat swelling, SOB or lightheadedness with hypotension: No ?Has patient had a PCN reaction causing severe rash involving mucus membranes or skin necrosis: No ?Has patient had a PCN reaction that required hospitalization: No ?Has patient had a PCN reaction occurring within the last 10 years: No ?If all of the above answers are "NO", then  may proceed with Cephalosporin use. ?  ? Demerol [Meperidine] Swelling and Rash  ?  Tongue swelling  ? Estrogens Rash  ?  The patient can use patch,No oral  ? Gabapentin Rash  ? Stadol [Butorphanol] Swelling and Rash  ?  Tongue swelling  ? ? ? ?HOME MEDICATIONS: ?Outpatient Medications Prior to Visit  ?Medication Sig Dispense Refill  ? cetirizine (ZYRTEC) 10 MG tablet Take 10  mg by mouth at bedtime.     ? COVID-19 At Home Antigen Test [facility] COVID-19 HOME TEST) KIT Use as directed within the package instructions 4 each 0  ? EPINEPHrine 0.3 mg/0.3 mL IJ SOAJ injection Inject 1 pen (0.3 mg) into the muscle as needed for anaphylaxis. 1 each 2  ? estradiol (VIVELLE-DOT) 0.05 MG/24HR patch APPLY 1 PATCH TO SKIN TWICE A WEEK. 24 patch 3  ? methylphenidate (CONCERTA) 36 MG PO CR tablet Take 1 tablet by mouth daily. 30 tablet 0  ? Prasterone (INTRAROSA) 6.5 MG INST INSERT 1 VAGINAL INSERT EVERY DAY AS DIRECTED 28 each 1  ? spironolactone (ALDACTONE) 50 MG tablet TAKE 1 TABLET BY MOUTH ONCE DAILY 90 tablet 2  ? topiramate (TOPAMAX) 25 MG capsule Take 1-3 capsules (25-75 mg total) by mouth at bedtime to prevent migraine headaches 270 capsule 0  ? cefdinir (OMNICEF) 300 MG capsule Take 1 capsule (300 mg total) by mouth 2 (two) times daily for 10 days 20 capsule 0  ? ciprofloxacin (CIPRO) 500 MG tablet Take 1 tablet by mouth one hour prior to stent removal 1 tablet 0  ? cyclobenzaprine (FLEXERIL) 10 MG tablet Take 1 tablet (10 mg total) by mouth at bedtime as needed for muscle spasms 30 tablet 0  ? phenazopyridine (PYRIDIUM) 200 MG tablet Take 1 tablet by mouth every 8 hours as needed 10 tablet 0  ? traMADol (ULTRAM) 50 MG tablet Take 1-2 tablets by mouth every 6 hours 15 tablet 0  ? ?No facility-administered medications prior to visit.  ? ? ? ?PAST MEDICAL HISTORY: ?Past Medical History:  ?Diagnosis Date  ? Allergy   ? Angio-edema   ? Anxiety   ? Cancer [facility])   ? basal cell skin ca  ? Depression   ? Family history of  adverse reaction to anesthesia   ? sister woke up during surgery age 39's for tonsillectomy  ? Multiple sclerosis ([place])   ? Neuromuscular disorder ([place])   ? multiple sclerosis  ? Vision abnormalities   ? ?

## 2022-01-20 NOTE — Patient Instructions (Signed)
Below is our plan: ? ?We will continue to monitor. Continue topiramate as directed by PCP. Consider using Migraine Buddy app to tract headaches if they worsen. Reach out to me any time if you have questions or any concerns.  ? ?Please make sure you are staying well hydrated. I recommend 50-60 ounces daily. Well balanced diet and regular exercise encouraged. Consistent sleep schedule with 6-8 hours recommended.  ? ?Please continue follow up with care team as directed.  ? ?Follow up with me in 1 year  ? ?You may receive a survey regarding today's visit. I encourage you to leave honest feed back as I do use this information to improve patient care. Thank you for seeing me today!  ? ? ?

## 2022-03-17 ENCOUNTER — Other Ambulatory Visit: Payer: Self-pay | Admitting: Neurology

## 2022-03-17 ENCOUNTER — Other Ambulatory Visit (HOSPITAL_COMMUNITY): Payer: Self-pay

## 2022-03-17 MED ORDER — METHYLPHENIDATE HCL ER (OSM) 36 MG PO TBCR
36.0000 mg | EXTENDED_RELEASE_TABLET | Freq: Every day | ORAL | 0 refills | Status: DC
  Filled 2022-03-17: qty 30, 30d supply, fill #0

## 2022-03-17 NOTE — Telephone Encounter (Signed)
Last OV was on 01/20/22.  Next OV is scheduled for 01/21/23.  Last RX was written on 01/10/22 for 30 tabs.   [place] [facility] Database has been reviewed.

## 2022-03-21 ENCOUNTER — Other Ambulatory Visit (HOSPITAL_COMMUNITY): Payer: Self-pay

## 2022-03-21 DIAGNOSIS — L570 Actinic keratosis: Secondary | ICD-10-CM | POA: Diagnosis not present

## 2022-03-21 DIAGNOSIS — L821 Other seborrheic keratosis: Secondary | ICD-10-CM | POA: Diagnosis not present

## 2022-03-21 DIAGNOSIS — D2262 Melanocytic nevi of left upper limb, including shoulder: Secondary | ICD-10-CM | POA: Diagnosis not present

## 2022-03-21 DIAGNOSIS — Z85828 Personal history of other malignant neoplasm of skin: Secondary | ICD-10-CM | POA: Diagnosis not present

## 2022-03-21 DIAGNOSIS — D2261 Melanocytic nevi of right upper limb, including shoulder: Secondary | ICD-10-CM | POA: Diagnosis not present

## 2022-03-31 DIAGNOSIS — T63461A Toxic effect of venom of wasps, accidental (unintentional), initial encounter: Secondary | ICD-10-CM | POA: Diagnosis not present

## 2022-04-02 ENCOUNTER — Other Ambulatory Visit (HOSPITAL_COMMUNITY): Payer: Self-pay

## 2022-04-14 ENCOUNTER — Other Ambulatory Visit: Payer: Self-pay | Admitting: Internal Medicine

## 2022-04-14 ENCOUNTER — Ambulatory Visit
Admission: RE | Admit: 2022-04-14 | Discharge: 2022-04-14 | Disposition: A | Discharge status: Home / Self Care | Payer: 59 | Source: Ambulatory Visit | Attending: Internal Medicine | Admitting: Internal Medicine

## 2022-04-14 DIAGNOSIS — M5442 Lumbago with sciatica, left side: Secondary | ICD-10-CM | POA: Diagnosis not present

## 2022-04-14 DIAGNOSIS — M2550 Pain in unspecified joint: Secondary | ICD-10-CM | POA: Diagnosis not present

## 2022-04-14 DIAGNOSIS — M545 Low back pain, unspecified: Secondary | ICD-10-CM | POA: Diagnosis not present

## 2022-04-14 DIAGNOSIS — G8929 Other chronic pain: Secondary | ICD-10-CM | POA: Diagnosis not present

## 2022-04-17 ENCOUNTER — Other Ambulatory Visit (HOSPITAL_COMMUNITY): Payer: Self-pay

## 2022-04-18 ENCOUNTER — Other Ambulatory Visit (HOSPITAL_COMMUNITY): Payer: Self-pay

## 2022-04-23 ENCOUNTER — Other Ambulatory Visit (HOSPITAL_COMMUNITY): Payer: Self-pay

## 2022-04-23 MED ORDER — SPIRONOLACTONE 50 MG PO TABS
50.0000 mg | ORAL_TABLET | Freq: Every day | ORAL | 0 refills | Status: DC
  Filled 2022-04-23: qty 90, 90d supply, fill #0

## 2022-04-25 ENCOUNTER — Other Ambulatory Visit (HOSPITAL_COMMUNITY): Payer: Self-pay

## 2022-04-25 DIAGNOSIS — M25552 Pain in left hip: Secondary | ICD-10-CM | POA: Diagnosis not present

## 2022-04-25 DIAGNOSIS — M5459 Other low back pain: Secondary | ICD-10-CM | POA: Diagnosis not present

## 2022-04-28 ENCOUNTER — Other Ambulatory Visit (HOSPITAL_COMMUNITY): Payer: Self-pay

## 2022-05-01 DIAGNOSIS — M5459 Other low back pain: Secondary | ICD-10-CM | POA: Diagnosis not present

## 2022-05-01 DIAGNOSIS — M25552 Pain in left hip: Secondary | ICD-10-CM | POA: Diagnosis not present

## 2022-05-13 DIAGNOSIS — M25552 Pain in left hip: Secondary | ICD-10-CM | POA: Diagnosis not present

## 2022-05-13 DIAGNOSIS — M5459 Other low back pain: Secondary | ICD-10-CM | POA: Diagnosis not present

## 2022-05-14 ENCOUNTER — Other Ambulatory Visit: Payer: Self-pay | Admitting: Neurology

## 2022-05-14 ENCOUNTER — Other Ambulatory Visit (HOSPITAL_COMMUNITY): Payer: Self-pay

## 2022-05-15 ENCOUNTER — Other Ambulatory Visit (HOSPITAL_COMMUNITY): Payer: Self-pay

## 2022-05-15 MED ORDER — METHYLPHENIDATE HCL ER (OSM) 36 MG PO TBCR
36.0000 mg | EXTENDED_RELEASE_TABLET | Freq: Every day | ORAL | 0 refills | Status: DC
  Filled 2022-05-15: qty 30, 30d supply, fill #0

## 2022-05-16 DIAGNOSIS — M25552 Pain in left hip: Secondary | ICD-10-CM | POA: Diagnosis not present

## 2022-05-16 DIAGNOSIS — M5459 Other low back pain: Secondary | ICD-10-CM | POA: Diagnosis not present

## 2022-05-19 DIAGNOSIS — M5459 Other low back pain: Secondary | ICD-10-CM | POA: Diagnosis not present

## 2022-05-19 DIAGNOSIS — M25552 Pain in left hip: Secondary | ICD-10-CM | POA: Diagnosis not present

## 2022-05-23 DIAGNOSIS — M5459 Other low back pain: Secondary | ICD-10-CM | POA: Diagnosis not present

## 2022-05-23 DIAGNOSIS — M25552 Pain in left hip: Secondary | ICD-10-CM | POA: Diagnosis not present

## 2022-06-05 DIAGNOSIS — M25552 Pain in left hip: Secondary | ICD-10-CM | POA: Diagnosis not present

## 2022-06-05 DIAGNOSIS — M5459 Other low back pain: Secondary | ICD-10-CM | POA: Diagnosis not present

## 2022-06-17 DIAGNOSIS — M25552 Pain in left hip: Secondary | ICD-10-CM | POA: Diagnosis not present

## 2022-06-17 DIAGNOSIS — M5459 Other low back pain: Secondary | ICD-10-CM | POA: Diagnosis not present

## 2022-06-30 ENCOUNTER — Other Ambulatory Visit (HOSPITAL_COMMUNITY): Payer: Self-pay

## 2022-06-30 DIAGNOSIS — Z5181 Encounter for therapeutic drug level monitoring: Secondary | ICD-10-CM | POA: Diagnosis not present

## 2022-06-30 DIAGNOSIS — G379 Demyelinating disease of central nervous system, unspecified: Secondary | ICD-10-CM | POA: Diagnosis not present

## 2022-06-30 DIAGNOSIS — E559 Vitamin D deficiency, unspecified: Secondary | ICD-10-CM | POA: Diagnosis not present

## 2022-06-30 DIAGNOSIS — Z0000 Encounter for general adult medical examination without abnormal findings: Secondary | ICD-10-CM | POA: Diagnosis not present

## 2022-06-30 DIAGNOSIS — E7800 Pure hypercholesterolemia, unspecified: Secondary | ICD-10-CM | POA: Diagnosis not present

## 2022-06-30 DIAGNOSIS — B001 Herpesviral vesicular dermatitis: Secondary | ICD-10-CM | POA: Diagnosis not present

## 2022-06-30 MED ORDER — IVERMECTIN 1 % EX CREA
TOPICAL_CREAM | CUTANEOUS | 5 refills | Status: DC
  Filled 2022-06-30 – 2022-08-08 (×2): qty 45, 30d supply, fill #0

## 2022-06-30 MED ORDER — SPIRONOLACTONE 50 MG PO TABS
50.0000 mg | ORAL_TABLET | Freq: Every day | ORAL | 5 refills | Status: DC
  Filled 2022-06-30 – 2022-12-03 (×2): qty 90, 90d supply, fill #0
  Filled 2023-05-07: qty 90, 90d supply, fill #1

## 2022-07-04 DIAGNOSIS — M25552 Pain in left hip: Secondary | ICD-10-CM | POA: Diagnosis not present

## 2022-07-04 DIAGNOSIS — M5459 Other low back pain: Secondary | ICD-10-CM | POA: Diagnosis not present

## 2022-07-15 ENCOUNTER — Other Ambulatory Visit: Payer: Self-pay

## 2022-07-15 ENCOUNTER — Other Ambulatory Visit (HOSPITAL_BASED_OUTPATIENT_CLINIC_OR_DEPARTMENT_OTHER): Payer: Self-pay

## 2022-07-15 ENCOUNTER — Encounter (HOSPITAL_BASED_OUTPATIENT_CLINIC_OR_DEPARTMENT_OTHER): Payer: Self-pay

## 2022-07-15 ENCOUNTER — Emergency Department (HOSPITAL_BASED_OUTPATIENT_CLINIC_OR_DEPARTMENT_OTHER): Payer: 59

## 2022-07-15 ENCOUNTER — Emergency Department (HOSPITAL_BASED_OUTPATIENT_CLINIC_OR_DEPARTMENT_OTHER)
Admission: EM | Admit: 2022-07-15 | Discharge: 2022-07-15 | Disposition: A | Discharge status: Home / Self Care | Payer: 59 | Attending: Emergency Medicine | Admitting: Emergency Medicine

## 2022-07-15 DIAGNOSIS — R6883 Chills (without fever): Secondary | ICD-10-CM | POA: Diagnosis not present

## 2022-07-15 DIAGNOSIS — R110 Nausea: Secondary | ICD-10-CM | POA: Insufficient documentation

## 2022-07-15 DIAGNOSIS — Z79899 Other long term (current) drug therapy: Secondary | ICD-10-CM | POA: Insufficient documentation

## 2022-07-15 DIAGNOSIS — K5900 Constipation, unspecified: Secondary | ICD-10-CM | POA: Diagnosis not present

## 2022-07-15 DIAGNOSIS — R1030 Lower abdominal pain, unspecified: Secondary | ICD-10-CM | POA: Diagnosis not present

## 2022-07-15 DIAGNOSIS — R1032 Left lower quadrant pain: Secondary | ICD-10-CM | POA: Diagnosis present

## 2022-07-15 LAB — CBC
HCT: 45.8 % (ref 36.0–46.0)
Hemoglobin: 15.9 g/dL — ABNORMAL HIGH (ref 12.0–15.0)
MCH: 30.2 pg (ref 26.0–34.0)
MCHC: 34.7 g/dL (ref 30.0–36.0)
MCV: 87.1 fL (ref 80.0–100.0)
Platelets: 380 10*3/uL (ref 150–400)
RBC: 5.26 MIL/uL — ABNORMAL HIGH (ref 3.87–5.11)
RDW: 12.7 % (ref 11.5–15.5)
WBC: 6 10*3/uL (ref 4.0–10.5)
nRBC: 0 % (ref 0.0–0.2)

## 2022-07-15 LAB — COMPREHENSIVE METABOLIC PANEL
ALT: 19 U/L (ref 0–44)
AST: 13 U/L — ABNORMAL LOW (ref 15–41)
Albumin: 4.6 g/dL (ref 3.5–5.0)
Alkaline Phosphatase: 79 U/L (ref 38–126)
Anion gap: 9 (ref 5–15)
BUN: 15 mg/dL (ref 6–20)
CO2: 25 mmol/L (ref 22–32)
Calcium: 9.6 mg/dL (ref 8.9–10.3)
Chloride: 104 mmol/L (ref 98–111)
Creatinine, Ser: 0.84 mg/dL (ref 0.44–1.00)
GFR, Estimated: >60 mL/min (ref >60)
Glucose, Bld: 121 mg/dL — ABNORMAL HIGH (ref 70–99)
Potassium: 3.5 mmol/L (ref 3.5–5.1)
Sodium: 138 mmol/L (ref 135–145)
Total Bilirubin: 0.8 mg/dL (ref 0.3–1.2)
Total Protein: 7.4 g/dL (ref 6.5–8.1)

## 2022-07-15 LAB — URINALYSIS, ROUTINE W REFLEX MICROSCOPIC
Bilirubin Urine: NEGATIVE
Glucose, UA: NEGATIVE mg/dL
Hgb urine dipstick: NEGATIVE
Ketones, ur: NEGATIVE mg/dL
Leukocytes,Ua: NEGATIVE
Nitrite: NEGATIVE
Protein, ur: NEGATIVE mg/dL
Specific Gravity, Urine: <1.005 — ABNORMAL LOW (ref 1.005–1.030)
pH: 5.5 (ref 5.0–8.0)

## 2022-07-15 LAB — LIPASE, BLOOD: Lipase: 29 U/L (ref 11–51)

## 2022-07-15 MED ORDER — FLEET ENEMA 7-19 GM/118ML RE ENEM
1.0000 | ENEMA | Freq: Once | RECTAL | Status: AC
  Administered 2022-07-15: 1 via RECTAL
  Filled 2022-07-15: qty 1

## 2022-07-15 MED ORDER — FENTANYL CITRATE PF 50 MCG/ML IJ SOSY
50.0000 ug | PREFILLED_SYRINGE | Freq: Once | INTRAMUSCULAR | Status: AC
  Administered 2022-07-15: 50 ug via INTRAVENOUS
  Filled 2022-07-15: qty 1

## 2022-07-15 MED ORDER — ONDANSETRON HCL 4 MG/2ML IJ SOLN
4.0000 mg | Freq: Once | INTRAMUSCULAR | Status: AC
  Administered 2022-07-15: 4 mg via INTRAVENOUS
  Filled 2022-07-15: qty 2

## 2022-07-15 MED ORDER — GLYCERIN (ADULT) 2 G RE SUPP
1.0000 | RECTAL | 0 refills | Status: AC | PRN
  Filled 2022-07-15: qty 25, 25d supply, fill #0

## 2022-07-15 MED ORDER — POLYETHYLENE GLYCOL 3350 17 GM/SCOOP PO POWD
1.0000 | Freq: Once | ORAL | 0 refills | Status: AC
  Filled 2022-07-15: qty 238, 1d supply, fill #0

## 2022-07-15 MED ORDER — LACTATED RINGERS IV BOLUS
1000.0000 mL | Freq: Once | INTRAVENOUS | Status: AC
  Administered 2022-07-15: 1000 mL via INTRAVENOUS

## 2022-07-15 MED ORDER — IOHEXOL 300 MG/ML  SOLN
100.0000 mL | Freq: Once | INTRAMUSCULAR | Status: AC | PRN
  Administered 2022-07-15: 100 mL via INTRAVENOUS

## 2022-07-15 NOTE — ED Notes (Signed)
Patient transported to CT 

## 2022-07-15 NOTE — ED Provider Notes (Signed)
[facility] EMERGENCY DEPT Provider Note   CSN: [id number] Arrival date & time: 07/15/22  3664     History  Chief Complaint  Patient presents with   Abdominal Pain   Nausea   Constipation    [name] [name] is a 51 y.o. female.   Abdominal Pain Associated symptoms: nausea   Constipation Associated symptoms: abdominal pain and nausea      51 year old female with medical history significant for notable sclerosis, depression, angioedema who presents to the emergency department with roughly 5 days of abdominal pain.  The patient states that over the last 5 days, she has developed left lower quadrant abdominal pain with associated nausea and chills.  She tried drinking water and having a bit of toast this morning and developed severe left lower quadrant pain.  She is passing gas and had 2 small bowel movements this morning.  She endorses chills, denies any fevers.  She denies any vomiting.  She denies any vaginal bleeding or vaginal discharge.  She denies any dysuria or increased urinary frequency.  She denies any history of diverticulitis.  Home Medications Prior to Admission medications   Medication Sig Start Date End Date Taking? Authorizing Provider  glycerin adult 2 g suppository Place 1 suppository rectally as needed for constipation. 07/15/22  Yes Regan Lemming, MD  polyethylene glycol powder (GLYCOLAX/MIRALAX) 17 GM/SCOOP powder Take 255 g by mouth once for 1 dose. 07/15/22 07/15/22 Yes Regan Lemming, MD  cetirizine (ZYRTEC) 10 MG tablet Take 10 mg by mouth at bedtime.     [provider]  COVID-19 At Home Antigen Test [facility] COVID-19 HOME TEST) KIT Use as directed within the package instructions 03/01/21   Edmon Crape, [facility]  EPINEPHrine 0.3 mg/0.3 mL IJ SOAJ injection Inject 1 pen (0.3 mg) into the muscle as needed for anaphylaxis. 06/17/21   Garnet Sierras, DO  estradiol (VIVELLE-DOT) 0.05 MG/24HR patch APPLY 1 PATCH TO SKIN TWICE A WEEK. 10/22/21      Ivermectin (SOOLANTRA) 1 % CREA Apply thin layer on the skin of face twice a day 06/30/22     methylphenidate (CONCERTA) 36 MG PO CR tablet Take 1 tablet (36 mg total) by mouth daily. 05/15/22   Lomax, Amy, NP  Prasterone (INTRAROSA) 6.5 MG INST INSERT 1 VAGINAL INSERT EVERY DAY AS DIRECTED 07/02/21     spironolactone (ALDACTONE) 50 MG tablet TAKE 1 TABLET BY MOUTH ONCE DAILY 12/19/20     spironolactone (ALDACTONE) 50 MG tablet Take 1 tablet (50 mg total) by mouth daily. 06/30/22     topiramate (TOPAMAX) 25 MG capsule Take 1-3 capsules (25-75 mg total) by mouth at bedtime to prevent migraine headaches 12/26/21         Allergies    Venlafaxine, Augmentin [amoxicillin-pot clavulanate], Demerol [meperidine], Estrogens, Gabapentin, and Stadol [butorphanol]    Review of Systems   Review of Systems  Gastrointestinal:  Positive for abdominal pain and nausea.  All other systems reviewed and are negative.   Physical Exam Updated Vital Signs BP 110/86 (BP Location: Left Arm)   Pulse 70   Temp 98 F (36.7 C)   Resp 11   Ht _0  (1.626 m)   Wt 65.8 kg   LMP 07/13/2015 (Exact Date)   SpO2 99%   BMI 24.89 kg/m  Physical Exam Vitals and nursing note reviewed.  Constitutional:      General: She is not in acute distress.    Appearance: She is well-developed.  HENT:  Head: Normocephalic and atraumatic.  Eyes:     Conjunctiva/sclera: Conjunctivae normal.  Cardiovascular:     Rate and Rhythm: Normal rate and regular rhythm.     Heart sounds: No murmur heard. Pulmonary:     Effort: Pulmonary effort is normal. No respiratory distress.     Breath sounds: Normal breath sounds.  Abdominal:     Palpations: Abdomen is soft.     Tenderness: There is abdominal tenderness in the left lower quadrant. There is no guarding or rebound.  Musculoskeletal:        General: No swelling.     Cervical back: Neck supple.  Skin:    General: Skin is warm and dry.     Capillary Refill: Capillary refill  takes less than 2 seconds.  Neurological:     Mental Status: She is alert.  Psychiatric:        Mood and Affect: Mood normal.     ED Results / Procedures / Treatments   Labs (all labs ordered are listed, but only abnormal results are displayed) Labs Reviewed  COMPREHENSIVE METABOLIC PANEL - Abnormal; Notable for the following components:      Result Value   Glucose, Bld 121 (*)    AST 13 (*)    All other components within normal limits  CBC - Abnormal; Notable for the following components:   RBC 5.26 (*)    Hemoglobin 15.9 (*)    All other components within normal limits  URINALYSIS, ROUTINE W REFLEX MICROSCOPIC - Abnormal; Notable for the following components:   Specific Gravity, Urine <1.005 (*)    All other components within normal limits  LIPASE, BLOOD    EKG None  Radiology CT ABDOMEN PELVIS W CONTRAST  Result Date: 07/15/2022 CLINICAL DATA:  Lower abdominal pain, nausea, constipation for 5 days. EXAM: CT ABDOMEN AND PELVIS WITH CONTRAST TECHNIQUE: Multidetector CT imaging of the abdomen and pelvis was performed using the standard protocol following bolus administration of intravenous contrast. RADIATION DOSE REDUCTION: This exam was performed according to the departmental dose-optimization program which includes automated exposure control, adjustment of the mA and/or kV according to patient size and/or use of iterative reconstruction technique. CONTRAST:  140m OMNIPAQUE IOHEXOL 300 MG/ML  SOLN COMPARISON:  CT abdomen/pelvis 05/28/2021 FINDINGS: Lower chest: The lung bases are clear. The imaged heart is unremarkable. Hepatobiliary: The liver is unremarkable. The gallbladder is surgically absent. There is no biliary ductal dilatation. Pancreas: Unremarkable. Spleen: Unremarkable. Adrenals/Urinary Tract: The adrenals are unremarkable. The kidneys are unremarkable, with no focal lesion, stone, hydronephrosis, or hydroureter. There is symmetric excretion of contrast into the  collecting systems on the delayed images. Stomach/Bowel: The stomach is unremarkable. There is no evidence of bowel obstruction. There is no abnormal bowel wall thickening or inflammatory change. There is a moderate stool burden throughout the colon and rectum. The appendix is not definitively identified; however, there is no pericecal inflammatory change. Vascular/Lymphatic: Abdominal aorta is normal in course and caliber. The major branch vessels are patent. The main portal and splenic veins are patent. There is a 0.8 cm short axis lymph node in the right lower quadrant anterior to the right psoas muscle which is unchanged going back to 2021. There is no new or progressive lymphadenopathy in the abdomen or pelvis. Reproductive: The uterus is surgically absent. There is no adnexal mass. Other: There is no ascites or free air. Musculoskeletal: There is no acute osseous abnormality or suspicious osseous lesion. IMPRESSION: 1. No acute findings in the abdomen or  pelvis. 2. Moderate stool burden throughout the colon and rectum. Electronically Signed   By: Valetta Mole M.D.   On: 07/15/2022 09:49    Procedures Procedures    Medications Ordered in ED Medications  ondansetron (ZOFRAN) injection 4 mg (4 mg Intravenous Given 07/15/22 0818)  fentaNYL (SUBLIMAZE) injection 50 mcg (50 mcg Intravenous Given 07/15/22 0841)  lactated ringers bolus 1,000 mL (0 mLs Intravenous Stopped 07/15/22 1108)  iohexol (OMNIPAQUE) 300 MG/ML solution 100 mL (100 mLs Intravenous Contrast Given 07/15/22 0918)  sodium phosphate (FLEET) 7-19 GM/118ML enema 1 enema (1 enema Rectal Given 07/15/22 1113)  fentaNYL (SUBLIMAZE) injection 50 mcg (50 mcg Intravenous Given 07/15/22 1103)    ED Course/ Medical Decision Making/ A&P                           Medical Decision Making Amount and/or Complexity of Data Reviewed Labs: ordered. Radiology: ordered.  Risk OTC drugs. Prescription drug management.    51 year old female with  medical history significant for notable sclerosis, depression, angioedema who presents to the emergency department with roughly 5 days of abdominal pain.  The patient states that over the last 5 days, she has developed left lower quadrant abdominal pain with associated nausea and chills.  She tried drinking water and having a bit of toast this morning and developed severe left lower quadrant pain.  She is passing gas and had 2 small bowel movements this morning.  She endorses chills, denies any fevers.  She denies any vomiting.  She denies any vaginal bleeding or vaginal discharge.  She denies any dysuria or increased urinary frequency.  She denies any history of diverticulitis.  On arrival, the patient was vitally stable, afebrile, not tachycardic or tachypneic, BP 153/81, saturating 99% on room air.  Sinus rhythm noted on cardiac telemetry.  Physical exam significant for significant left lower quadrant tenderness to palpation, no rebound or guarding.  Differential diagnosis includes diverticulitis, constipation, small bowel obstruction, nephrolithiasis, pyelonephritis, UTI.  Patient is status post robotic assisted laparoscopic hysterectomy and salpingectomy in 2019, considered ovarian pathology.  IV access was obtained and the patient was administered an IV fluid bolus, 50 mcg of fentanyl, 4 mg of IV Zofran.   Laboratory evaluation significant for some hemoconcentration with a hemoglobin of 15.9, otherwise unremarkable.  The patient was administered an LR bolus with subsequent improvement in volume status.  CT abdomen pelvis was performed and results are as follows: IMPRESSION:  1. No acute findings in the abdomen or pelvis.  2. Moderate stool burden throughout the colon and rectum.   Concern for constipation resulting in the patient's symptoms.  A fleets enema was performed in the emergency department with subsequent adequate bowel movement and relief of symptoms.  Advised that the patient trial  glycerin suppositories in addition to a MiraLAX regimen for the next 2 days.  Advised PCP follow-up.  Stable for discharge.   Final Clinical Impression(s) / ED Diagnoses Final diagnoses:  Constipation, unspecified constipation type    Rx / DC Orders ED Discharge Orders          Ordered    glycerin adult 2 g suppository  As needed        07/15/22 1209    polyethylene glycol powder (GLYCOLAX/MIRALAX) 17 GM/SCOOP powder   Once        07/15/22 1210              Regan Lemming, MD 07/15/22 1211

## 2022-07-15 NOTE — ED Triage Notes (Signed)
Pt c/o lower abdominal pain, nausea, and constipation for the past 5 days. Denies any other associated symptoms.

## 2022-07-15 NOTE — ED Notes (Signed)
Discharge instructions, follow up care, and prescription reviewed and explained, pt verbalized understanding. Pt verbalized understanding and had no further questions on d/c. Pt caox4 qand ambulatory on d/c.

## 2022-07-15 NOTE — Discharge Instructions (Addendum)
Take 4 capfuls of MiraLAX in a 32 ounce Gatorade and drink it over 4 hours.  Can repeat 24 hours later and increase overall hydration as well.  Follow-up with your PCP.

## 2022-07-15 NOTE — ED Notes (Signed)
Enema admin, pt able to tolerate for approx 10 min. Pt on bedside commode at present.

## 2022-07-16 ENCOUNTER — Encounter: Payer: Self-pay | Admitting: Gastroenterology

## 2022-07-18 ENCOUNTER — Other Ambulatory Visit (HOSPITAL_COMMUNITY): Payer: Self-pay | Admitting: Surgery

## 2022-07-18 DIAGNOSIS — R109 Unspecified abdominal pain: Secondary | ICD-10-CM

## 2022-07-18 DIAGNOSIS — K5900 Constipation, unspecified: Secondary | ICD-10-CM

## 2022-07-21 ENCOUNTER — Other Ambulatory Visit: Payer: Self-pay | Admitting: Surgery

## 2022-07-21 ENCOUNTER — Ambulatory Visit (HOSPITAL_COMMUNITY)
Admission: RE | Admit: 2022-07-21 | Discharge: 2022-07-21 | Disposition: A | Discharge status: Home / Self Care | Payer: 59 | Source: Ambulatory Visit | Attending: Surgery | Admitting: Surgery

## 2022-07-21 ENCOUNTER — Other Ambulatory Visit (HOSPITAL_COMMUNITY): Payer: Self-pay | Admitting: Surgery

## 2022-07-21 DIAGNOSIS — R109 Unspecified abdominal pain: Secondary | ICD-10-CM | POA: Insufficient documentation

## 2022-07-21 DIAGNOSIS — K56600 Partial intestinal obstruction, unspecified as to cause: Secondary | ICD-10-CM

## 2022-07-21 DIAGNOSIS — K5900 Constipation, unspecified: Secondary | ICD-10-CM | POA: Diagnosis not present

## 2022-07-22 ENCOUNTER — Other Ambulatory Visit (HOSPITAL_COMMUNITY): Payer: Self-pay

## 2022-07-22 ENCOUNTER — Ambulatory Visit (HOSPITAL_COMMUNITY)
Admission: RE | Admit: 2022-07-22 | Discharge: 2022-07-22 | Disposition: A | Discharge status: Home / Self Care | Payer: 59 | Source: Ambulatory Visit | Attending: Surgery | Admitting: Surgery

## 2022-07-22 DIAGNOSIS — R1030 Lower abdominal pain, unspecified: Secondary | ICD-10-CM | POA: Diagnosis not present

## 2022-07-22 DIAGNOSIS — K56600 Partial intestinal obstruction, unspecified as to cause: Secondary | ICD-10-CM | POA: Diagnosis not present

## 2022-07-22 MED ORDER — IOHEXOL 300 MG/ML  SOLN
450.0000 mL | Freq: Once | INTRAMUSCULAR | Status: AC | PRN
  Administered 2022-07-22: 450 mL

## 2022-07-25 ENCOUNTER — Other Ambulatory Visit (HOSPITAL_COMMUNITY): Payer: Self-pay

## 2022-08-05 ENCOUNTER — Other Ambulatory Visit (HOSPITAL_COMMUNITY): Payer: Self-pay

## 2022-08-05 ENCOUNTER — Ambulatory Visit: Payer: 59 | Admitting: Gastroenterology

## 2022-08-05 ENCOUNTER — Encounter: Payer: Self-pay | Admitting: Gastroenterology

## 2022-08-05 VITALS — BP 118/80 | HR 70 | Ht 64.0 in | Wt 147.0 lb

## 2022-08-05 DIAGNOSIS — R1032 Left lower quadrant pain: Secondary | ICD-10-CM

## 2022-08-05 DIAGNOSIS — K5900 Constipation, unspecified: Secondary | ICD-10-CM | POA: Diagnosis not present

## 2022-08-05 DIAGNOSIS — R194 Change in bowel habit: Secondary | ICD-10-CM | POA: Diagnosis not present

## 2022-08-05 MED ORDER — NA SULFATE-K SULFATE-MG SULF 17.5-3.13-1.6 GM/177ML PO SOLN
1.0000 | ORAL | 0 refills | Status: DC
  Filled 2022-08-05: qty 354, 1d supply, fill #0

## 2022-08-05 NOTE — Progress Notes (Signed)
Noted  

## 2022-08-05 NOTE — Progress Notes (Signed)
08/05/2022 [name] [name] [id number] [birth year]   HISTORY OF PRESENT ILLNESS:  This is a 51 year old female who is a patient of Dr. Blanch Media.  She presents here today with complaints of change in bowel habits with constipation over the past couple of months and left-sided abdominal pain (up to 8/10 on pain scale).  She says that the pain takes her breath away at times.  No fevers but felt like she had chills once.  No nausea or vomiting.  She went to the emergency department 07/19/2022 and CT scan of the abdomen and pelvis with IV contrast showed moderate stool burden throughout the colon and rectum.  Pain improved with enemas and treatment of her constipation, but does not completely resolve.  She also then had a barium enema that was unremarkable for source.  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  No diverticulosis/diverticulitis noted or seen even on previous colonoscopy as that was normal in 07/2015.  Of note, that colonoscopy was performed for complaints of change in bowel habits with constipation and right lower quadrant abdominal pain.  She says that she even stopped all of her medications for 2 weeks to see if the constipation will clear up, but that obviously did not help.  She has been eating prunes.  She has tried suppositories and has done MiraLAX, but only about every other day.   Past Medical History:  Diagnosis Date   Allergy    Angio-edema    Anxiety    Cancer ([place])    basal cell skin ca   Depression    Family history of adverse reaction to anesthesia    sister woke up during surgery age 26's for tonsillectomy   Multiple sclerosis ([place])    Neuromuscular disorder ([place])    multiple sclerosis   Vision abnormalities    Past Surgical History:  Procedure Laterality Date   ADENOIDECTOMY     adnoidectomy     BUNIONECTOMY Left    foot   CESAREAN SECTION     CHOLECYSTECTOMY     ROBOTIC ASSISTED LAPAROSCOPIC HYSTERECTOMY AND SALPINGECTOMY N/A 01/19/2018   Procedure: XI ROBOTIC  ASSISTED LAPAROSCOPIC HYSTERECTOMY AND BILATERAL SALPINGECTOMY, LEFT OVARIAN CYSTECTOMY;  Surgeon: Everitt Amber, MD;  Location: [facility];  Service: Gynecology;  Laterality: N/A;   wisdom teeth exraction      reports that she has never smoked. She has never used smokeless tobacco. She reports current alcohol use. She reports that she does not use drugs. family history includes Breast cancer in her sister; Cervical cancer in her mother; Dementia in her mother; Diabetes in her mother; High Cholesterol in her father; Irritable bowel syndrome in her father; Melanoma in her father; Ulcerative colitis in her sister; Uterine cancer in her mother. Allergies  Allergen Reactions   Venlafaxine Hives    blisters   Augmentin [Amoxicillin-Pot Clavulanate] Rash    tongue swelling Has patient had a PCN reaction causing immediate rash, facial/tongue/throat swelling, SOB or lightheadedness with hypotension: No Has patient had a PCN reaction causing severe rash involving mucus membranes or skin necrosis: No Has patient had a PCN reaction that required hospitalization: No Has patient had a PCN reaction occurring within the last 10 years: No If all of the above answers are "NO", then may proceed with Cephalosporin use.    Demerol [Meperidine] Swelling and Rash    Tongue swelling   Estrogens Rash    The patient can use patch,No oral   Gabapentin Rash   Stadol [Butorphanol] Swelling and  Rash    Tongue swelling      Outpatient Encounter Medications as of 08/05/2022  Medication Sig   cetirizine (ZYRTEC) 10 MG tablet Take 10 mg by mouth at bedtime.    COVID-19 At Home Antigen Test [facility] COVID-19 HOME TEST) KIT Use as directed within the package instructions   EPINEPHrine 0.3 mg/0.3 mL IJ SOAJ injection Inject 1 pen (0.3 mg) into the muscle as needed for anaphylaxis.   estradiol (VIVELLE-DOT) 0.05 MG/24HR patch APPLY 1 PATCH TO SKIN TWICE A WEEK.   glycerin adult 2 g suppository Place 1 suppository rectally  as needed for constipation.   Ivermectin (SOOLANTRA) 1 % CREA Apply thin layer on the skin of face twice a day   methylphenidate (CONCERTA) 36 MG PO CR tablet Take 1 tablet (36 mg total) by mouth daily.   Prasterone (INTRAROSA) 6.5 MG INST INSERT 1 VAGINAL INSERT EVERY DAY AS DIRECTED   spironolactone (ALDACTONE) 50 MG tablet TAKE 1 TABLET BY MOUTH ONCE DAILY   spironolactone (ALDACTONE) 50 MG tablet Take 1 tablet (50 mg total) by mouth daily.   topiramate (TOPAMAX) 25 MG capsule Take 1-3 capsules (25-75 mg total) by mouth at bedtime to prevent migraine headaches   No facility-administered encounter medications on file as of 08/05/2022.     REVIEW OF SYSTEMS  : All other systems reviewed and negative except where noted in the History of Present Illness.   PHYSICAL EXAM: BP 118/80   Pulse 70   Ht _0  (1.626 m)   Wt 147 lb (66.7 kg)   LMP 07/13/2015 (Exact Date)   SpO2 97%   BMI 25.23 kg/m  General: Well developed white female in no acute distress Head: Normocephalic and atraumatic Eyes:  Sclerae anicteric, conjunctiva pink. Ears: Normal auditory acuity Lungs: Clear throughout to auscultation; no W/R/R. Heart: Regular rate and rhythm; no M/R/G. Abdomen: Soft, non-distended.  BS present.  Some left mid-abdominal TTP. Rectal:  Will be done at the time of colonoscopy. Musculoskeletal: Symmetrical with no gross deformities  Skin: No lesions on visible extremities Extremities: No edema  Neurological: Alert oriented x 4, grossly non-focal Psychological:  Alert and cooperative. Normal mood and affect  ASSESSMENT AND PLAN: *51 year old female with complaints of change in bowel habits with constipation over the past couple of months and left-sided abdominal pain (up to 8/10 on pain scale).  Pain improved with enemas and treatment of her constipation, but does not completely resolve.  CT scan with IV contrast and barium enema were unremarkable for source.  She does have a redundant  colon.  No diverticulosis/diverticulitis noted or seen even on previous colonoscopy.  Will proceed with colonoscopy to be sure there is nothing present in her colon that is causing her symptoms.  She will also get the bowel purge so we will see if there is much improvement when she does that.  Will plan for 2-day bowel prep and colonoscopy with Dr. Henrene Pastor.  I wonder if she has scar tissue that is causing some tethering of her bowel and then worsens when the left side of her colon becomes full and distended.  I would like her to begin MiraLAX daily in the interim.  **The risks, benefits, and alternatives to colonoscopy were discussed with the patient and she consents to proceed.    CC:  Kathalene Frames, *

## 2022-08-05 NOTE — Patient Instructions (Addendum)
Start Miralax - 1 capful daily in at least 8 ounces of water daily.   You have been scheduled for a colonoscopy. Please follow written instructions given to you at your visit today.  Please pick up your prep supplies at the pharmacy within the next 1-3 days. If you use inhalers (even only as needed), please bring them with you on the day of your procedure.  We have sent the following medications to your pharmacy for you to pick up at your convenience: Suprep   Due to recent changes in healthcare laws, you may see the results of your imaging and laboratory studies on MyChart before your provider has had a chance to review them.  We understand that in some cases there may be results that are confusing or concerning to you. Not all laboratory results come back in the same time frame and the provider may be waiting for multiple results in order to interpret others.  Please give [place] 48 hours in order for your provider to thoroughly review all the results before contacting the office for clarification of your results.   Thank you for choosing me and [facility].  Alonza Bogus -PA

## 2022-08-06 ENCOUNTER — Other Ambulatory Visit (HOSPITAL_COMMUNITY): Payer: Self-pay

## 2022-08-08 ENCOUNTER — Other Ambulatory Visit (HOSPITAL_COMMUNITY): Payer: Self-pay

## 2022-08-11 DIAGNOSIS — Z6825 Body mass index (BMI) 25.0-25.9, adult: Secondary | ICD-10-CM | POA: Diagnosis not present

## 2022-08-11 DIAGNOSIS — Z01419 Encounter for gynecological examination (general) (routine) without abnormal findings: Secondary | ICD-10-CM | POA: Diagnosis not present

## 2022-08-12 ENCOUNTER — Other Ambulatory Visit: Payer: Self-pay | Admitting: Family Medicine

## 2022-08-12 ENCOUNTER — Other Ambulatory Visit (HOSPITAL_COMMUNITY): Payer: Self-pay

## 2022-08-12 MED ORDER — METHYLPHENIDATE HCL ER (OSM) 36 MG PO TBCR
36.0000 mg | EXTENDED_RELEASE_TABLET | Freq: Every day | ORAL | 0 refills | Status: DC
  Filled 2022-08-12: qty 30, 30d supply, fill #0

## 2022-09-02 ENCOUNTER — Encounter: Payer: Self-pay | Admitting: Internal Medicine

## 2022-09-11 ENCOUNTER — Encounter: Payer: Self-pay | Admitting: Internal Medicine

## 2022-09-11 ENCOUNTER — Ambulatory Visit (AMBULATORY_SURGERY_CENTER): Payer: Commercial Managed Care - PPO | Admitting: Internal Medicine

## 2022-09-11 VITALS — BP 112/70 | HR 70 | Temp 97.3°F | Resp 12 | Ht 64.0 in | Wt 147.0 lb

## 2022-09-11 DIAGNOSIS — K5900 Constipation, unspecified: Secondary | ICD-10-CM | POA: Diagnosis not present

## 2022-09-11 DIAGNOSIS — F419 Anxiety disorder, unspecified: Secondary | ICD-10-CM | POA: Diagnosis not present

## 2022-09-11 DIAGNOSIS — R1032 Left lower quadrant pain: Secondary | ICD-10-CM | POA: Diagnosis not present

## 2022-09-11 DIAGNOSIS — F32A Depression, unspecified: Secondary | ICD-10-CM | POA: Diagnosis not present

## 2022-09-11 DIAGNOSIS — R194 Change in bowel habit: Secondary | ICD-10-CM

## 2022-09-11 MED ORDER — SODIUM CHLORIDE 0.9 % IV SOLN: 500.0000 mL | Freq: Once | INTRAVENOUS | Status: DC

## 2022-09-11 NOTE — Progress Notes (Signed)
08/05/2022 [name] [name] [id number] 03/28/1971     HISTORY OF PRESENT ILLNESS:  This is a 52 year old female who is a patient of Dr. Blanch Media.  She presents here today with complaints of change in bowel habits with constipation over the past couple of months and left-sided abdominal pain (up to 8/10 on pain scale).  She says that the pain takes her breath away at times.  No fevers but felt like she had chills once.  No nausea or vomiting.  She went to the emergency department 07/19/2022 and CT scan of the abdomen and pelvis with IV contrast showed moderate stool burden throughout the colon and rectum.  Pain improved with enemas and treatment of her constipation, but does not completely resolve.  She also then had a barium enema that was unremarkable for source.  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  No diverticulosis/diverticulitis noted or seen even on previous colonoscopy as that was normal in 07/2015.  Of note, that colonoscopy was performed for complaints of change in bowel habits with constipation and right lower quadrant abdominal pain.  She says that she even stopped all of her medications for 2 weeks to see if the constipation will clear up, but that obviously did not help.  She has been eating prunes.  She has tried suppositories and has done MiraLAX, but only about every other day.         Past Medical History:  Diagnosis Date   Allergy     Angio-edema     Anxiety     Cancer ([place])      basal cell skin ca   Depression     Family history of adverse reaction to anesthesia      sister woke up during surgery age 75's for tonsillectomy   Multiple sclerosis ([place])     Neuromuscular disorder ([place])      multiple sclerosis   Vision abnormalities           Past Surgical History:  Procedure Laterality Date   ADENOIDECTOMY       adnoidectomy       BUNIONECTOMY Left      foot   CESAREAN SECTION       CHOLECYSTECTOMY       ROBOTIC ASSISTED LAPAROSCOPIC HYSTERECTOMY AND SALPINGECTOMY N/A  01/19/2018    Procedure: XI ROBOTIC ASSISTED LAPAROSCOPIC HYSTERECTOMY AND BILATERAL SALPINGECTOMY, LEFT OVARIAN CYSTECTOMY;  Surgeon: Everitt Amber, MD;  Location: [facility];  Service: Gynecology;  Laterality: N/A;   wisdom teeth exraction         reports that she has never smoked. She has never used smokeless tobacco. She reports current alcohol use. She reports that she does not use drugs. family history includes Breast cancer in her sister; Cervical cancer in her mother; Dementia in her mother; Diabetes in her mother; High Cholesterol in her father; Irritable bowel syndrome in her father; Melanoma in her father; Ulcerative colitis in her sister; Uterine cancer in her mother.      Allergies  Allergen Reactions   Venlafaxine Hives      blisters   Augmentin [Amoxicillin-Pot Clavulanate] Rash      tongue swelling Has patient had a PCN reaction causing immediate rash, facial/tongue/throat swelling, SOB or lightheadedness with hypotension: No Has patient had a PCN reaction causing severe rash involving mucus membranes or skin necrosis: No Has patient had a PCN reaction that required hospitalization: No Has patient had a PCN reaction occurring within the last 10 years: No If all of the  above answers are "NO", then may proceed with Cephalosporin use.     Demerol [Meperidine] Swelling and Rash      Tongue swelling   Estrogens Rash      The patient can use patch,No oral   Gabapentin Rash   Stadol [Butorphanol] Swelling and Rash      Tongue swelling            Outpatient Encounter Medications as of 08/05/2022  Medication Sig   cetirizine (ZYRTEC) 10 MG tablet Take 10 mg by mouth at bedtime.    COVID-19 At Home Antigen Test [facility] COVID-19 HOME TEST) KIT Use as directed within the package instructions   EPINEPHrine 0.3 mg/0.3 mL IJ SOAJ injection Inject 1 pen (0.3 mg) into the muscle as needed for anaphylaxis.   estradiol (VIVELLE-DOT) 0.05 MG/24HR patch APPLY 1 PATCH TO SKIN TWICE A WEEK.    glycerin adult 2 g suppository Place 1 suppository rectally as needed for constipation.   Ivermectin (SOOLANTRA) 1 % CREA Apply thin layer on the skin of face twice a day   methylphenidate (CONCERTA) 36 MG PO CR tablet Take 1 tablet (36 mg total) by mouth daily.   Prasterone (INTRAROSA) 6.5 MG INST INSERT 1 VAGINAL INSERT EVERY DAY AS DIRECTED   spironolactone (ALDACTONE) 50 MG tablet TAKE 1 TABLET BY MOUTH ONCE DAILY   spironolactone (ALDACTONE) 50 MG tablet Take 1 tablet (50 mg total) by mouth daily.   topiramate (TOPAMAX) 25 MG capsule Take 1-3 capsules (25-75 mg total) by mouth at bedtime to prevent migraine headaches    No facility-administered encounter medications on file as of 08/05/2022.        REVIEW OF SYSTEMS  : All other systems reviewed and negative except where noted in the History of Present Illness.     PHYSICAL EXAM: BP 118/80   Pulse 70   Ht _0  (1.626 m)   Wt 147 lb (66.7 kg)   LMP 07/13/2015 (Exact Date)   SpO2 97%   BMI 25.23 kg/m  General: Well developed white female in no acute distress Head: Normocephalic and atraumatic Eyes:  Sclerae anicteric, conjunctiva pink. Ears: Normal auditory acuity Lungs: Clear throughout to auscultation; no W/R/R. Heart: Regular rate and rhythm; no M/R/G. Abdomen: Soft, non-distended.  BS present.  Some left mid-abdominal TTP. Rectal:  Will be done at the time of colonoscopy. Musculoskeletal: Symmetrical with no gross deformities  Skin: No lesions on visible extremities Extremities: No edema  Neurological: Alert oriented x 4, grossly non-focal Psychological:  Alert and cooperative. Normal mood and affect   ASSESSMENT AND PLAN: *52 year old female with complaints of change in bowel habits with constipation over the past couple of months and left-sided abdominal pain (up to 8/10 on pain scale).  Pain improved with enemas and treatment of her constipation, but does not completely resolve.  CT scan with IV contrast and  barium enema were unremarkable for source.  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  No diverticulosis/diverticulitis noted or seen even on previous colonoscopy.  Will proceed with colonoscopy to be sure there is nothing present in her colon that is causing her symptoms.  She will also get the bowel purge so we will see if there is much improvement when she does that.  Will plan for 2-day bowel prep and colonoscopy with Dr. Henrene Pastor.  I wonder if she has scar tissue that is causing some tethering of her bowel and then worsens when the left side of her colon becomes full and distended.  I would like her to begin MiraLAX daily in the interim.   **The risks, benefits, and alternatives to colonoscopy were discussed with the patient and she consents to proceed.      CC:  Okey Dupre A, *  H&P as outlined above.  No interval change.  Now for colonoscopy.

## 2022-09-11 NOTE — Op Note (Signed)
[facility] Patient Name: [name] [name] Procedure Date: 09/11/2022 2:09 PM MRN: [id number] Endoscopist: Docia Chuck. Henrene Pastor , MD, [id number] Age: 52 Referring MD:  Date of Birth: Dec 18, 1970 Gender: Female Account #: [id number] Procedure:                Colonoscopy Indications:              Abdominal pain in the left lower quadrant, Change                            in bowel habits, Constipation Medicines:                Monitored Anesthesia Care Procedure:                Pre-Anesthesia Assessment:                           - Prior to the procedure, a History and Physical                            was performed, and patient medications and                            allergies were reviewed. The patient's tolerance of                            previous anesthesia was also reviewed. The risks                            and benefits of the procedure and the sedation                            options and risks were discussed with the patient.                            All questions were answered, and informed consent                            was obtained. Prior Anticoagulants: The patient has                            taken no anticoagulant or antiplatelet agents. ASA                            Grade Assessment: II - A patient with mild systemic                            disease. After reviewing the risks and benefits,                            the patient was deemed in satisfactory condition to                            undergo the procedure.  After obtaining informed consent, the colonoscope                            was passed under direct vision. Throughout the                            procedure, the patient's blood pressure, pulse, and                            oxygen saturations were monitored continuously. The                            Colonoscope was introduced through the anus and                            advanced to the the cecum,  identified by                            appendiceal orifice and ileocecal valve. The                            ileocecal valve, appendiceal orifice, and rectum                            were photographed. The quality of the bowel                            preparation was excellent. The colonoscopy was                            performed without difficulty. The patient tolerated                            the procedure well. The bowel preparation used was                            SUPREP via split dose instruction. Scope In: 2:22:50 PM Scope Out: 2:36:26 PM Scope Withdrawal Time: 0 hours 9 minutes 14 seconds  Total Procedure Duration: 0 hours 13 minutes 36 seconds  Findings:                 The entire examined colon appeared normal on direct                            and retroflexion views. Complications:            No immediate complications. Estimated blood loss:                            None. Estimated Blood Loss:     Estimated blood loss: none. Recommendation:           - Repeat colonoscopy in 10 years for screening                            purposes.                           -  Patient has a contact number available for                            emergencies. The signs and symptoms of potential                            delayed complications were discussed with the                            patient. Return to normal activities tomorrow.                            Written discharge instructions were provided to the                            patient.                           - Resume previous diet.                           - Continue present medications. Docia Chuck. Henrene Pastor, MD 09/11/2022 2:45:41 PM This report has been signed electronically.

## 2022-09-11 NOTE — Progress Notes (Signed)
Pt resting comfortably. VSS. Airway intact. SBAR complete to RN. All questions answered.   

## 2022-09-11 NOTE — Patient Instructions (Signed)
Please read handouts provided. Continue present medications. Repeat colonoscopy in 10 years for screening.   YOU HAD AN ENDOSCOPIC PROCEDURE TODAY AT THE [facility]:   Refer to the procedure report that was given to you for any specific questions about what was found during the examination.  If the procedure report does not answer your questions, please call your gastroenterologist to clarify.  If you requested that your care partner not be given the details of your procedure findings, then the procedure report has been included in a sealed envelope for you to review at your convenience later.  YOU SHOULD EXPECT: Some feelings of bloating in the abdomen. Passage of more gas than usual.  Walking can help get rid of the air that was put into your GI tract during the procedure and reduce the bloating. If you had a lower endoscopy (such as a colonoscopy or flexible sigmoidoscopy) you may notice spotting of blood in your stool or on the toilet paper. If you underwent a bowel prep for your procedure, you may not have a normal bowel movement for a few days.  Please Note:  You might notice some irritation and congestion in your nose or some drainage.  This is from the oxygen used during your procedure.  There is no need for concern and it should clear up in a day or so.  SYMPTOMS TO REPORT IMMEDIATELY:  Following lower endoscopy (colonoscopy or flexible sigmoidoscopy):  Excessive amounts of blood in the stool  Significant tenderness or worsening of abdominal pains  Swelling of the abdomen that is new, acute  Fever of 100F or higher.  For urgent or emergent issues, a gastroenterologist can be reached at any hour by calling [phone]. Do not use MyChart messaging for urgent concerns.    DIET:  We do recommend a small meal at first, but then you may proceed to your regular diet.  Drink plenty of fluids but you should avoid alcoholic beverages for 24 hours.  ACTIVITY:  You should  plan to take it easy for the rest of today and you should NOT DRIVE or use heavy machinery until tomorrow (because of the sedation medicines used during the test).    FOLLOW UP: Our staff will call the number listed on your records the next business day following your procedure.  We will call around 7:15- 8:00 am to check on you and address any questions or concerns that you may have regarding the information given to you following your procedure. If we do not reach you, we will leave a message.     If any biopsies were taken you will be contacted by phone or by letter within the next 1-3 weeks.  Please call us at [phone] if you have not heard about the biopsies in 3 weeks.    SIGNATURES/CONFIDENTIALITY: You and/or your care partner have signed paperwork which will be entered into your electronic medical record.  These signatures attest to the fact that that the information above on your After Visit Summary has been reviewed and is understood.  Full responsibility of the confidentiality of this discharge information lies with you and/or your care-partner. 

## 2022-09-12 ENCOUNTER — Telehealth: Payer: Self-pay | Admitting: *Deleted

## 2022-09-12 NOTE — Telephone Encounter (Signed)
  Follow up Call-    Row Labels 09/11/2022    1:34 PM  Call back number   Section Header. No data exists in this row.   Post procedure Call Back phone  #   [phone]  Permission to leave phone message   Yes     Patient questions:  Do you have a fever, pain , or abdominal swelling? No. Pain Score  0 *  Have you tolerated food without any problems? Yes.    Have you been able to return to your normal activities? Yes.    Do you have any questions about your discharge instructions: Diet   No. Medications  No. Follow up visit  No.  Do you have questions or concerns about your Care? No.  Actions: * If pain score is 4 or above: No action needed, pain <4.

## 2022-10-07 ENCOUNTER — Other Ambulatory Visit: Payer: Self-pay | Admitting: Surgery

## 2022-10-07 DIAGNOSIS — R109 Unspecified abdominal pain: Secondary | ICD-10-CM

## 2022-10-13 ENCOUNTER — Other Ambulatory Visit (HOSPITAL_COMMUNITY): Payer: Self-pay

## 2022-10-14 ENCOUNTER — Other Ambulatory Visit (HOSPITAL_COMMUNITY): Payer: Self-pay

## 2022-10-14 MED ORDER — ESTRADIOL 0.05 MG/24HR TD PTTW
MEDICATED_PATCH | TRANSDERMAL | 3 refills | Status: AC
  Filled 2022-10-14: qty 24, 84d supply, fill #0
  Filled 2023-01-09: qty 24, 84d supply, fill #1

## 2022-10-20 ENCOUNTER — Other Ambulatory Visit (HOSPITAL_COMMUNITY): Payer: Self-pay

## 2022-10-20 ENCOUNTER — Other Ambulatory Visit: Payer: Self-pay | Admitting: Family Medicine

## 2022-10-20 MED ORDER — METHYLPHENIDATE HCL ER (OSM) 36 MG PO TBCR
36.0000 mg | EXTENDED_RELEASE_TABLET | Freq: Every day | ORAL | 0 refills | Status: DC
  Filled 2022-10-20: qty 30, 30d supply, fill #0

## 2022-10-20 MED ORDER — INTRAROSA 6.5 MG VA INST
VAGINAL_INSERT | VAGINAL | 2 refills | Status: AC
  Filled 2022-10-20: qty 84, 84d supply, fill #0

## 2022-10-21 ENCOUNTER — Other Ambulatory Visit (HOSPITAL_COMMUNITY): Payer: Self-pay

## 2022-10-22 ENCOUNTER — Other Ambulatory Visit (HOSPITAL_COMMUNITY): Payer: Self-pay

## 2022-10-23 DIAGNOSIS — R109 Unspecified abdominal pain: Secondary | ICD-10-CM | POA: Diagnosis not present

## 2022-10-23 DIAGNOSIS — R102 Pelvic and perineal pain: Secondary | ICD-10-CM | POA: Diagnosis not present

## 2022-11-03 ENCOUNTER — Ambulatory Visit
Admission: RE | Admit: 2022-11-03 | Discharge: 2022-11-03 | Disposition: A | Discharge status: Home / Self Care | Payer: Commercial Managed Care - PPO | Source: Ambulatory Visit | Attending: Surgery | Admitting: Surgery

## 2022-11-03 DIAGNOSIS — K5900 Constipation, unspecified: Secondary | ICD-10-CM | POA: Diagnosis not present

## 2022-11-03 DIAGNOSIS — K3189 Other diseases of stomach and duodenum: Secondary | ICD-10-CM | POA: Diagnosis not present

## 2022-11-03 DIAGNOSIS — R109 Unspecified abdominal pain: Secondary | ICD-10-CM

## 2022-11-03 DIAGNOSIS — K449 Diaphragmatic hernia without obstruction or gangrene: Secondary | ICD-10-CM | POA: Diagnosis not present

## 2022-11-03 MED ORDER — IOPAMIDOL (ISOVUE-370) INJECTION 76%
80.0000 mL | Freq: Once | INTRAVENOUS | Status: AC | PRN
  Administered 2022-11-03: 80 mL via INTRAVENOUS

## 2022-12-03 ENCOUNTER — Other Ambulatory Visit (HOSPITAL_COMMUNITY): Payer: Self-pay

## 2022-12-04 DIAGNOSIS — R531 Weakness: Secondary | ICD-10-CM | POA: Diagnosis not present

## 2022-12-04 DIAGNOSIS — M546 Pain in thoracic spine: Secondary | ICD-10-CM | POA: Diagnosis not present

## 2022-12-04 DIAGNOSIS — M25676 Stiffness of unspecified foot, not elsewhere classified: Secondary | ICD-10-CM | POA: Diagnosis not present

## 2022-12-04 DIAGNOSIS — M542 Cervicalgia: Secondary | ICD-10-CM | POA: Diagnosis not present

## 2022-12-06 ENCOUNTER — Other Ambulatory Visit (HOSPITAL_COMMUNITY): Payer: Self-pay

## 2022-12-15 DIAGNOSIS — R102 Pelvic and perineal pain: Secondary | ICD-10-CM | POA: Diagnosis not present

## 2022-12-15 DIAGNOSIS — Z8742 Personal history of other diseases of the female genital tract: Secondary | ICD-10-CM | POA: Diagnosis not present

## 2022-12-16 DIAGNOSIS — R531 Weakness: Secondary | ICD-10-CM | POA: Diagnosis not present

## 2022-12-16 DIAGNOSIS — M542 Cervicalgia: Secondary | ICD-10-CM | POA: Diagnosis not present

## 2022-12-16 DIAGNOSIS — M546 Pain in thoracic spine: Secondary | ICD-10-CM | POA: Diagnosis not present

## 2022-12-16 DIAGNOSIS — M25676 Stiffness of unspecified foot, not elsewhere classified: Secondary | ICD-10-CM | POA: Diagnosis not present

## 2023-01-01 DIAGNOSIS — Z90710 Acquired absence of both cervix and uterus: Secondary | ICD-10-CM | POA: Diagnosis not present

## 2023-01-01 DIAGNOSIS — Z880 Allergy status to penicillin: Secondary | ICD-10-CM | POA: Diagnosis not present

## 2023-01-01 DIAGNOSIS — N736 Female pelvic peritoneal adhesions (postinfective): Secondary | ICD-10-CM | POA: Diagnosis not present

## 2023-01-01 DIAGNOSIS — R102 Pelvic and perineal pain: Secondary | ICD-10-CM | POA: Diagnosis not present

## 2023-01-01 DIAGNOSIS — Z888 Allergy status to other drugs, medicaments and biological substances status: Secondary | ICD-10-CM | POA: Diagnosis not present

## 2023-01-01 DIAGNOSIS — Z885 Allergy status to narcotic agent status: Secondary | ICD-10-CM | POA: Diagnosis not present

## 2023-01-01 DIAGNOSIS — Z9079 Acquired absence of other genital organ(s): Secondary | ICD-10-CM | POA: Diagnosis not present

## 2023-01-01 DIAGNOSIS — Z7722 Contact with and (suspected) exposure to environmental tobacco smoke (acute) (chronic): Secondary | ICD-10-CM | POA: Diagnosis not present

## 2023-01-01 DIAGNOSIS — Z91018 Allergy to other foods: Secondary | ICD-10-CM | POA: Diagnosis not present

## 2023-01-05 ENCOUNTER — Encounter (HOSPITAL_BASED_OUTPATIENT_CLINIC_OR_DEPARTMENT_OTHER): Payer: Self-pay

## 2023-01-05 ENCOUNTER — Ambulatory Visit (HOSPITAL_BASED_OUTPATIENT_CLINIC_OR_DEPARTMENT_OTHER): Admit: 2023-01-05 | Payer: Commercial Managed Care - PPO | Admitting: Obstetrics and Gynecology

## 2023-01-05 SURGERY — SALPINGO-OOPHORECTOMY, LAPAROSCOPIC
Anesthesia: Choice | Laterality: Left

## 2023-01-09 ENCOUNTER — Other Ambulatory Visit (HOSPITAL_COMMUNITY): Payer: Self-pay

## 2023-01-10 ENCOUNTER — Other Ambulatory Visit (HOSPITAL_COMMUNITY): Payer: Self-pay

## 2023-01-19 DIAGNOSIS — H5203 Hypermetropia, bilateral: Secondary | ICD-10-CM | POA: Diagnosis not present

## 2023-01-20 NOTE — Progress Notes (Unsigned)
No chief complaint on file.    HISTORY OF PRESENT ILLNESS:  01/20/23 ALL:  [name] [name] returns for follow up for history of optic neuritis. She was last seen 01/2022 and doing well.  Headaches? Vision changes? Ophthalmology?  Concerta  01/20/2022 ALL: [name] [name] returns for follow up for history of optic neuritis. She reports doing well. She has had more headaches recently. She did have a really bad headache in 10/2021 that was associated with visual disturbances. She talked to her opthalmologic who was not concerned and told her to monitor. She was started on topiramate 25-75mg  daily at bedtime. She has taken 25mg  nightly with no headaches since. No other sensory or motor changes. She continues Concerta. She does not take every day. Last fill 01/10/2022 for #30. She is sleeping well. She continues to help care for her mother in Hospice care and her sister with cancer. Appetite is good. She tries to exercise for about 30 minutes 4 times a week.   07/22/2021 ALL:  [name] [name] is a 52 y.o. female here today for follow up for history of optic neuritis. She has been followed for possible MS. Not on DMT. Last MRI in 2019 negative for demyelinating lesions.   She reports that she has been doing really well until two weeks ago. No new or worsening symptoms but she does state that she has been tired and sluggish for the past two weeks. She has had left eye pain. She feels that gait has been a little off. No falls. She feels it is harder to swallow foods. She reports all of these symptoms usually come and go but do not normally linger for 2 weeks at a time. She does endorse more stress. Her mother is in a SNF for dementia and was recently admitted to Hospice. She was caught off guard and not expecting that conversation. She does have a good support system. She denies any significant depression.   Concerta helps with attention and fatigue. She does not take every day. Last refill 06/26/21. PDMP appropriate. She is  sleeping well. No changes in bladder or bowel functioning. Intermittent incontinence persists. No vision changes. No new numbness or weakness.   HISTORY (copied from Dr Bonnita Hollow previous note)  [name] [name] is a 52 y.o. woman with a history of optic neuritis and who has undergone evaluation for possible multiple sclerosis.   Update 01/04/2021:  She is generally doing well.    No visual problems but she notes mild left eye pain at times (has been off/on since the ON).    She had left optic neuritis in 2009. She also has had urinary incontinence but that is doing well now.  She occasionally has numbness when she gets a massage but not at other times.  Gait is fine.       MRI of the brain and spine 06/23/2018.  There was no evidence of demyelination or myelopathy.  She has mild spinal stenosis at [facility]   ADD is doing well on Concerta 36 mg .  She feels the medciation holds her throughout the day.   She does not have insomnia      She is sleeping 8-9 hours and feels refreshed when she wakes up.   She was diagnosed with ADD as a child but did not receive medications.       Optic neuritis history:  In 2009, she lost vision in her left eye.   She had a black central dot and blurriness around that.  She felt symptoms resolved within an hour but was back the next day.    She had a similar recurrence the next day.    She saw ophthalmology (Dr. Luciana Axe) and was diagnosed with optic neuritis.    MRI's of the brain and cervical spine were normal.     She had numbness in her face shortly later.    She was referred to Dr. Corwin Levins and then another doctor at [facility].    MS was not confirmed.   She saw Dr. Leotis Shames who felt she probably had MS but did not start a DMT.    She then had an episode of left leg numbness and left foot drop that comes and goes for a day or two and still does a couple times a year.    She also has a lot of fatigue (physical > cognitive).   When this occurs, she is more likely to have an  episode involving the left leg.   She has had a couple more episodes of blurry vision but none the past year.   She has had steroids (starting with 200 mg and tapering down over a week or so) about twice a year.   The last time was summer 2018.  Some episodes are associated with her gait worsening and even her speech being off.     She always feels better for a few months afterwards.     She is on Ritalin 10 mg po bid and that usually helps her fatigue.  She sleeps well most every night.   She denies any major problems with mood.     Studies:  She was initially evaluated by Dr. Sandria Manly and had MRI studies, evoked potential studies CSF analysis (no oligoclonal bands and IgG index was normal) and blood work including NMO, anti-cardiolipin antibody, ANA, Ace, RPR, ESR, B12, Lyme. The blood work was normal or negative.   MRI of the brain and spine 06/23/2018:  There was no evidence of demyelination or myelopathy.  She has mild spinal stenosis at C5C6   MRIs of the brain dated 05/10/2017 month 08/14/2014 and 05/10/2008 an MRI of the cervical spine from 12/16/2016 and MRI the thoracic spine from 08/16/2008. There were no demyelinating lesions in any of the MRIs   REVIEW OF SYSTEMS: Out of a complete 14 system review of symptoms, the patient complains only of the following symptoms, headaches and all other reviewed systems are negative.   ALLERGIES: Allergies  Allergen Reactions   Venlafaxine Hives    blisters   Augmentin [Amoxicillin-Pot Clavulanate] Rash    tongue swelling Has patient had a PCN reaction causing immediate rash, facial/tongue/throat swelling, SOB or lightheadedness with hypotension: No Has patient had a PCN reaction causing severe rash involving mucus membranes or skin necrosis: No Has patient had a PCN reaction that required hospitalization: No Has patient had a PCN reaction occurring within the last 10 years: No If all of the above answers are "NO", then may proceed with  Cephalosporin use.    Demerol [Meperidine] Swelling and Rash    Tongue swelling   Estrogens Rash    The patient can use patch,No oral   Gabapentin Rash   Stadol [Butorphanol] Swelling and Rash    Tongue swelling     HOME MEDICATIONS: Outpatient Medications Prior to Visit  Medication Sig Dispense Refill   cetirizine (ZYRTEC) 10 MG tablet Take 10 mg by mouth at bedtime.      COVID-19 At Home Antigen Test [facility] COVID-19 HOME  TEST) KIT Use as directed within the package instructions 4 each 0   EPINEPHrine 0.3 mg/0.3 mL IJ SOAJ injection Inject 1 pen (0.3 mg) into the muscle as needed for anaphylaxis. 1 each 2   estradiol (VIVELLE-DOT) 0.05 MG/24HR patch APPLY 1 PATCH TO SKIN TWICE A WEEK. 24 patch 3   glycerin adult 2 g suppository Place 1 suppository rectally as needed for constipation. 25 suppository 0   Ivermectin (SOOLANTRA) 1 % CREA Apply thin layer on the skin of face twice a day 45 g 5   methylphenidate (CONCERTA) 36 MG PO CR tablet Take 1 tablet (36 mg total) by mouth daily. 30 tablet 0   Prasterone (INTRAROSA) 6.5 MG INST INSERT 1 VAGINAL INSERT EVERY DAY AS DIRECTED 84 each 2   spironolactone (ALDACTONE) 50 MG tablet TAKE 1 TABLET BY MOUTH ONCE DAILY 90 tablet 2   spironolactone (ALDACTONE) 50 MG tablet Take 1 tablet (50 mg total) by mouth daily. 90 tablet 5   topiramate (TOPAMAX) 25 MG capsule Take 1-3 capsules (25-75 mg total) by mouth at bedtime to prevent migraine headaches 270 capsule 0   No facility-administered medications prior to visit.     PAST MEDICAL HISTORY: Past Medical History:  Diagnosis Date   Allergy    Angio-edema    Anxiety    Cancer (HCC)    basal cell skin ca   Depression    Family history of adverse reaction to anesthesia    sister woke up during surgery age 41's for tonsillectomy   Multiple sclerosis (HCC)    Neuromuscular disorder (HCC)    multiple sclerosis   Vision abnormalities      PAST SURGICAL HISTORY: Past Surgical History:   Procedure Laterality Date   ADENOIDECTOMY     adnoidectomy     BUNIONECTOMY Left    foot   CESAREAN SECTION     CHOLECYSTECTOMY     ROBOTIC ASSISTED LAPAROSCOPIC HYSTERECTOMY AND SALPINGECTOMY N/A 01/19/2018   Procedure: XI ROBOTIC ASSISTED LAPAROSCOPIC HYSTERECTOMY AND BILATERAL SALPINGECTOMY, LEFT OVARIAN CYSTECTOMY;  Surgeon: Adolphus Birchwood, MD;  Location: [facility];  Service: Gynecology;  Laterality: N/A;   wisdom teeth exraction       FAMILY HISTORY: Family History  Problem Relation Age of Onset   Cervical cancer Mother    Dementia Mother    Diabetes Mother    Uterine cancer Mother    Melanoma Father    Irritable bowel syndrome Father    High Cholesterol Father    Ulcerative colitis Sister    Breast cancer Sister    Colon cancer Neg Hx    Esophageal cancer Neg Hx    Rectal cancer Neg Hx    Stomach cancer Neg Hx      SOCIAL HISTORY: Social History   Socioeconomic History   Marital status: Divorced    Spouse name: Not on file   Number of children: 2   Years of education: Not on file   Highest education level: Not on file  Occupational History   Occupation: full time  Tobacco Use   Smoking status: Never   Smokeless tobacco: Never  Vaping Use   Vaping Use: Never used  Substance and Sexual Activity   Alcohol use: Yes    Alcohol/week: 0.0 standard drinks of alcohol    Comment: rarely   Drug use: No   Sexual activity: Yes  Other Topics Concern   Not on file  Social History Narrative   Lives with son and daughter   Right  Handed   Drinks 5 cups caffeine daily   Social Determinants of Health   Financial Resource Strain: Not on file  Food Insecurity: Not on file  Transportation Needs: Not on file  Physical Activity: Not on file  Stress: Not on file  Social Connections: Not on file  Intimate Partner Violence: Not on file     PHYSICAL EXAM  There were no vitals filed for this visit.   There is no height or weight on file to calculate  BMI.  Generalized: Well developed, in no acute distress  Cardiology: normal rate and rhythm, no murmur auscultated  Respiratory: clear to auscultation bilaterally    Neurological examination  Mentation: Alert oriented to time, place, history taking. Follows all commands speech and language fluent Cranial nerve II-XII: Pupils were equal round reactive to light. Extraocular movements were full, visual field were full on confrontational test. Facial sensation and strength were normal. Head turning and shoulder shrug  were normal and symmetric. Motor: The motor testing reveals 5 over 5 strength of all 4 extremities. Good symmetric motor tone is noted throughout.  Sensory: Sensory testing is intact to soft touch on all 4 extremities. No evidence of extinction is noted.  Gait and station: Gait is normal.    DIAGNOSTIC DATA (LABS, IMAGING, TESTING) - I reviewed patient records, labs, notes, testing and imaging myself where available.  Lab Results  Component Value Date   WBC 6.0 07/15/2022   HGB 15.9 (H) 07/15/2022   HCT 45.8 07/15/2022   MCV 87.1 07/15/2022   PLT 380 07/15/2022      Component Value Date/Time   NA 138 07/15/2022 0800   NA 143 10/12/2017 0947   K 3.5 07/15/2022 0800   CL 104 07/15/2022 0800   CO2 25 07/15/2022 0800   GLUCOSE 121 (H) 07/15/2022 0800   BUN 15 07/15/2022 0800   BUN 14 10/12/2017 0947   CREATININE 0.84 07/15/2022 0800   CALCIUM 9.6 07/15/2022 0800   PROT 7.4 07/15/2022 0800   PROT 7.1 10/12/2017 0947   ALBUMIN 4.6 07/15/2022 0800   ALBUMIN 4.6 10/12/2017 0947   AST 13 (L) 07/15/2022 0800   ALT 19 07/15/2022 0800   ALKPHOS 79 07/15/2022 0800   BILITOT 0.8 07/15/2022 0800   BILITOT 0.3 10/12/2017 0947   GFRNONAA >60 07/15/2022 0800   GFRAA >60 03/25/2018 1504   No results found for: "CHOL", "HDL", "LDLCALC", "LDLDIRECT", "TRIG", "CHOLHDL" No results found for: "HGBA1C" No results found for: "VITAMINB12" Lab Results  Component Value Date   TSH  1.810 10/12/2017        No data to display               No data to display           ASSESSMENT AND PLAN  52 y.o. year old female  has a past medical history of Allergy, Angio-edema, Anxiety, Cancer (HCC), Depression, Family history of adverse reaction to anesthesia, Multiple sclerosis (HCC), Neuromuscular disorder (HCC), and Vision abnormalities. here with    No diagnosis found.  [name] [name] reports doing well, today. She denies new sensory or motor symptoms. She has had one significant migraine with aura this year. Now reports headaches have resolved on topiramate 25mg  daily (PCP). She was encouraged to continue monitoring symptoms. We will continue Concerta. PDMP shows appropriate refills, last 01/10/2022. She will call when refills are needed. Healthy lifestyle habits encouraged. She will follow up with me in 1 year, sooner if needed.    No  orders of the defined types were placed in this encounter.     No orders of the defined types were placed in this encounter.      Shawnie Dapper, MSN, FNP-C 01/20/2023, 1:39 PM  [facility] [address], [address] [place], [place] [address] [phone]

## 2023-01-20 NOTE — Patient Instructions (Signed)
Below is our plan:  We will continue to monitor symptoms. Continue Concerta daily as needed. Let me know if you need anything!  Please make sure you are staying well hydrated. I recommend 50-60 ounces daily. Well balanced diet and regular exercise encouraged. Consistent sleep schedule with 6-8 hours recommended.   Please continue follow up with care team as directed.   Follow up with me in 1 year  You may receive a survey regarding today's visit. I encourage you to leave honest feed back as I do use this information to improve patient care. Thank you for seeing me today!

## 2023-01-21 ENCOUNTER — Other Ambulatory Visit (HOSPITAL_COMMUNITY): Payer: Self-pay

## 2023-01-21 ENCOUNTER — Other Ambulatory Visit: Payer: Self-pay

## 2023-01-21 ENCOUNTER — Ambulatory Visit: Payer: Commercial Managed Care - PPO | Admitting: Family Medicine

## 2023-01-21 ENCOUNTER — Encounter: Payer: Self-pay | Admitting: Family Medicine

## 2023-01-21 VITALS — BP 127/84 | HR 82 | Ht 64.0 in | Wt 144.2 lb

## 2023-01-21 DIAGNOSIS — H469 Unspecified optic neuritis: Secondary | ICD-10-CM

## 2023-01-21 DIAGNOSIS — G43109 Migraine with aura, not intractable, without status migrainosus: Secondary | ICD-10-CM

## 2023-01-21 DIAGNOSIS — F988 Other specified behavioral and emotional disorders with onset usually occurring in childhood and adolescence: Secondary | ICD-10-CM | POA: Diagnosis not present

## 2023-01-21 MED ORDER — METHYLPHENIDATE HCL ER (OSM) 36 MG PO TBCR
36.0000 mg | EXTENDED_RELEASE_TABLET | Freq: Every day | ORAL | 0 refills | Status: DC
  Filled 2023-01-21: qty 30, 30d supply, fill #0

## 2023-01-24 ENCOUNTER — Other Ambulatory Visit (HOSPITAL_COMMUNITY): Payer: Self-pay

## 2023-01-27 DIAGNOSIS — Z1231 Encounter for screening mammogram for malignant neoplasm of breast: Secondary | ICD-10-CM | POA: Diagnosis not present

## 2023-01-29 ENCOUNTER — Other Ambulatory Visit (HOSPITAL_COMMUNITY): Payer: Self-pay

## 2023-01-29 MED ORDER — ESTRADIOL 0.075 MG/24HR TD PTTW
1.0000 | MEDICATED_PATCH | TRANSDERMAL | 11 refills | Status: DC
  Filled 2023-01-29: qty 8, 28d supply, fill #0

## 2023-03-20 ENCOUNTER — Other Ambulatory Visit (HOSPITAL_COMMUNITY): Payer: Self-pay

## 2023-03-20 MED ORDER — NITROFURANTOIN MONOHYD MACRO 100 MG PO CAPS
100.0000 mg | ORAL_CAPSULE | Freq: Two times a day (BID) | ORAL | 0 refills | Status: DC
  Filled 2023-03-20: qty 10, 5d supply, fill #0

## 2023-03-30 ENCOUNTER — Other Ambulatory Visit (HOSPITAL_COMMUNITY): Payer: Self-pay

## 2023-03-30 MED ORDER — ESTRADIOL 0.1 MG/GM VA CREA: 2.0000 g | TOPICAL_CREAM | VAGINAL | 3 refills | Status: DC

## 2023-03-30 MED ORDER — ESTRADIOL 0.075 MG/24HR TD PTTW
1.0000 | MEDICATED_PATCH | TRANSDERMAL | 3 refills | Status: DC
  Filled 2023-03-30: qty 24, 84d supply, fill #0

## 2023-03-30 MED ORDER — ESTRADIOL 0.1 MG/GM VA CREA
2.0000 g | TOPICAL_CREAM | VAGINAL | 3 refills | Status: AC
  Filled 2023-03-30: qty 42.5, 70d supply, fill #0

## 2023-05-06 ENCOUNTER — Other Ambulatory Visit (HOSPITAL_COMMUNITY): Payer: Self-pay

## 2023-05-07 ENCOUNTER — Other Ambulatory Visit (HOSPITAL_COMMUNITY): Payer: Self-pay

## 2023-05-07 MED ORDER — TOPIRAMATE 25 MG PO CPSP
25.0000 mg | ORAL_CAPSULE | Freq: Every evening | ORAL | 0 refills | Status: DC | PRN
  Filled 2023-05-07: qty 270, 90d supply, fill #0

## 2023-05-08 ENCOUNTER — Other Ambulatory Visit (HOSPITAL_COMMUNITY): Payer: Self-pay

## 2023-05-14 ENCOUNTER — Other Ambulatory Visit (HOSPITAL_COMMUNITY): Payer: Self-pay

## 2023-05-15 ENCOUNTER — Other Ambulatory Visit (HOSPITAL_COMMUNITY): Payer: Self-pay

## 2023-05-15 MED ORDER — TRAZODONE HCL 50 MG PO TABS
ORAL_TABLET | ORAL | 0 refills | Status: DC
  Filled 2023-05-15: qty 60, 30d supply, fill #0

## 2023-06-09 ENCOUNTER — Other Ambulatory Visit: Payer: Self-pay | Admitting: Family Medicine

## 2023-06-09 ENCOUNTER — Other Ambulatory Visit (HOSPITAL_COMMUNITY): Payer: Self-pay

## 2023-06-09 MED ORDER — METHYLPHENIDATE HCL ER (OSM) 36 MG PO TBCR
36.0000 mg | EXTENDED_RELEASE_TABLET | Freq: Every day | ORAL | 0 refills | Status: DC
Start: 2023-06-09 — End: 2023-08-10
  Filled 2023-06-09: qty 30, 30d supply, fill #0

## 2023-06-09 NOTE — Telephone Encounter (Signed)
Last seen on 01/21/23 Follow up scheduled on 01/21/24 Last filled on 01/24/23 Rx pending to be signed

## 2023-07-06 ENCOUNTER — Other Ambulatory Visit (HOSPITAL_COMMUNITY): Payer: Self-pay

## 2023-07-06 MED ORDER — ESTRADIOL 0.1 MG/GM VA CREA
2.0000 g | TOPICAL_CREAM | VAGINAL | 3 refills | Status: DC
  Filled 2023-07-06: qty 42.5, 70d supply, fill #0

## 2023-07-06 MED ORDER — ESTRADIOL 0.075 MG/24HR TD PTTW
1.0000 | MEDICATED_PATCH | TRANSDERMAL | 3 refills | Status: DC
  Filled 2023-07-06: qty 24, 84d supply, fill #0

## 2023-07-16 ENCOUNTER — Other Ambulatory Visit (HOSPITAL_COMMUNITY): Payer: Self-pay

## 2023-07-17 ENCOUNTER — Other Ambulatory Visit: Payer: Self-pay

## 2023-07-17 ENCOUNTER — Other Ambulatory Visit (HOSPITAL_COMMUNITY): Payer: Self-pay

## 2023-07-17 MED ORDER — IVERMECTIN 1 % EX CREA
TOPICAL_CREAM | CUTANEOUS | 2 refills | Status: DC
  Filled 2023-07-17: qty 45, 30d supply, fill #0

## 2023-07-23 ENCOUNTER — Other Ambulatory Visit (HOSPITAL_COMMUNITY): Payer: Self-pay

## 2023-07-28 ENCOUNTER — Other Ambulatory Visit (HOSPITAL_COMMUNITY): Payer: Self-pay

## 2023-08-10 ENCOUNTER — Other Ambulatory Visit (HOSPITAL_COMMUNITY): Payer: Self-pay

## 2023-08-10 ENCOUNTER — Other Ambulatory Visit: Payer: Self-pay | Admitting: Family Medicine

## 2023-08-11 ENCOUNTER — Other Ambulatory Visit (HOSPITAL_COMMUNITY): Payer: Self-pay

## 2023-08-11 MED ORDER — METHYLPHENIDATE HCL ER (OSM) 36 MG PO TBCR
36.0000 mg | EXTENDED_RELEASE_TABLET | Freq: Every day | ORAL | 0 refills | Status: DC
  Filled 2023-08-11: qty 30, 30d supply, fill #0

## 2023-08-11 NOTE — Telephone Encounter (Signed)
Last seen on 01/21/23 Follow up scheduled on 01/21/24 Last filled on 06/12/23 #30 tablets (30 day supply) Rx pending to be signed

## 2023-08-12 ENCOUNTER — Other Ambulatory Visit (HOSPITAL_COMMUNITY): Payer: Self-pay

## 2023-08-18 ENCOUNTER — Other Ambulatory Visit (HOSPITAL_COMMUNITY): Payer: Self-pay

## 2023-08-18 ENCOUNTER — Other Ambulatory Visit: Payer: Self-pay

## 2023-08-18 MED ORDER — METRONIDAZOLE 0.75 % EX CREA
1.0000 | TOPICAL_CREAM | Freq: Two times a day (BID) | CUTANEOUS | 2 refills | Status: AC
  Filled 2023-08-18: qty 45, 23d supply, fill #0

## 2023-08-18 MED ORDER — DOXYCYCLINE HYCLATE 100 MG PO CAPS
100.0000 mg | ORAL_CAPSULE | Freq: Every day | ORAL | 1 refills | Status: DC
  Filled 2023-08-18: qty 30, 30d supply, fill #0
  Filled 2023-09-28: qty 30, 30d supply, fill #1

## 2023-08-19 ENCOUNTER — Other Ambulatory Visit (HOSPITAL_COMMUNITY): Payer: Self-pay

## 2023-08-24 ENCOUNTER — Other Ambulatory Visit: Payer: Self-pay

## 2023-08-24 ENCOUNTER — Other Ambulatory Visit (HOSPITAL_COMMUNITY): Payer: Self-pay

## 2023-08-24 MED ORDER — INTRAROSA 6.5 MG VA INST
1.0000 | VAGINAL_INSERT | Freq: Every day | VAGINAL | 6 refills | Status: DC
  Filled 2023-08-24: qty 28, 28d supply, fill #0

## 2023-08-24 MED ORDER — ESTRADIOL 0.05 MG/24HR TD PTTW
1.0000 | MEDICATED_PATCH | TRANSDERMAL | 11 refills | Status: DC
  Filled 2023-08-24: qty 8, 28d supply, fill #0
  Filled 2023-11-16: qty 24, 84d supply, fill #1
  Filled 2024-02-29: qty 8, 28d supply, fill #2
  Filled 2024-03-21 – 2024-03-22 (×3): qty 8, 28d supply, fill #3

## 2023-08-27 ENCOUNTER — Other Ambulatory Visit (HOSPITAL_COMMUNITY): Payer: Self-pay

## 2023-09-28 ENCOUNTER — Other Ambulatory Visit (HOSPITAL_COMMUNITY): Payer: Self-pay

## 2023-09-30 ENCOUNTER — Other Ambulatory Visit (HOSPITAL_COMMUNITY): Payer: Self-pay

## 2023-10-02 ENCOUNTER — Other Ambulatory Visit (HOSPITAL_COMMUNITY): Payer: Self-pay

## 2023-10-02 MED ORDER — DOXYCYCLINE HYCLATE 100 MG PO CAPS
100.0000 mg | ORAL_CAPSULE | Freq: Every day | ORAL | 3 refills | Status: AC
  Filled 2023-10-02 – 2023-11-16 (×2): qty 30, 30d supply, fill #0
  Filled 2024-01-22: qty 30, 30d supply, fill #1
  Filled 2024-05-03: qty 30, 30d supply, fill #2
  Filled 2024-08-10: qty 30, 30d supply, fill #3

## 2023-10-26 ENCOUNTER — Other Ambulatory Visit (HOSPITAL_COMMUNITY): Payer: Self-pay

## 2023-10-26 MED ORDER — MELOXICAM 15 MG PO TABS
15.0000 mg | ORAL_TABLET | Freq: Every day | ORAL | 0 refills | Status: DC
  Filled 2023-10-26: qty 10, 10d supply, fill #0

## 2023-10-29 ENCOUNTER — Other Ambulatory Visit (HOSPITAL_COMMUNITY): Payer: Self-pay

## 2023-11-16 ENCOUNTER — Other Ambulatory Visit: Payer: Self-pay | Admitting: Family Medicine

## 2023-11-16 ENCOUNTER — Other Ambulatory Visit (HOSPITAL_COMMUNITY): Payer: Self-pay

## 2023-11-16 ENCOUNTER — Other Ambulatory Visit: Payer: Self-pay

## 2023-11-16 MED ORDER — METHYLPHENIDATE HCL ER (OSM) 36 MG PO TBCR
36.0000 mg | EXTENDED_RELEASE_TABLET | Freq: Every day | ORAL | 0 refills | Status: DC
  Filled 2023-11-16: qty 30, 30d supply, fill #0

## 2023-11-16 NOTE — Telephone Encounter (Signed)
 LAST SEEN 01/21/23, next appt 01/21/24  Dispenses   Dispensed Days Supply Quantity Provider Pharmacy  methylphenidate (CONCERTA) 36 MG PO CR tablet 08/12/2023 30 30 tablet Lomax, Amy, NP [facility]  methylphenidate (CONCERTA) 36 MG PO CR tablet 06/12/2023 30 30 tablet Lomax, Amy, NP [place] [facility]  methylphenidate (CONCERTA) 36 MG PO CR tablet 01/24/2023 30 30 tablet Lomax, Amy, NP [place] [facility].Marland KitchenMarland Kitchen

## 2023-12-14 ENCOUNTER — Other Ambulatory Visit: Payer: Self-pay | Admitting: Gynecologic Oncology

## 2023-12-14 DIAGNOSIS — R590 Localized enlarged lymph nodes: Secondary | ICD-10-CM

## 2023-12-14 NOTE — Progress Notes (Signed)
 Patient presents to the office with left groin tenderness with palpation nodule ? Lymphadenopathy. This area has been present starting 2 weeks ago. No groin skin lesions or vulvar lesions per patient. No drainage from this area. Tenderness with light palpation of this area. Pea size nodule like area palpated. [place] inguinal ordered to evaluate for lymphadenectomy.

## 2023-12-16 ENCOUNTER — Ambulatory Visit (HOSPITAL_COMMUNITY)
Admission: RE | Admit: 2023-12-16 | Discharge: 2023-12-16 | Disposition: A | Discharge status: Home / Self Care | Source: Ambulatory Visit | Attending: Gynecologic Oncology | Admitting: Gynecologic Oncology

## 2023-12-16 ENCOUNTER — Other Ambulatory Visit: Payer: Self-pay | Admitting: Gynecologic Oncology

## 2023-12-16 ENCOUNTER — Ambulatory Visit (HOSPITAL_COMMUNITY)
Admission: RE | Admit: 2023-12-16 | Discharge: 2023-12-16 | Disposition: A | Discharge status: Home / Self Care | Payer: Self-pay | Source: Ambulatory Visit | Attending: Gynecologic Oncology | Admitting: Gynecologic Oncology

## 2023-12-16 DIAGNOSIS — M25552 Pain in left hip: Secondary | ICD-10-CM | POA: Diagnosis present

## 2023-12-16 DIAGNOSIS — R590 Localized enlarged lymph nodes: Secondary | ICD-10-CM

## 2023-12-16 NOTE — Progress Notes (Signed)
 Persistent worsening left hip pain. Severe pain this am. DG hip xray ordered along with inguinal [place]

## 2024-01-04 ENCOUNTER — Other Ambulatory Visit (HOSPITAL_COMMUNITY): Payer: Self-pay

## 2024-01-04 MED ORDER — MELOXICAM 15 MG PO TABS
15.0000 mg | ORAL_TABLET | Freq: Every day | ORAL | 0 refills | Status: DC
  Filled 2024-01-04: qty 10, 10d supply, fill #0

## 2024-01-19 NOTE — Progress Notes (Addendum)
 Chief Complaint  Patient presents with   Follow-up    Pt in room 2. Here Optic neuritis, migraine follow up. Pt said past weekend she had headache for 4 days. Took motrin  stayed in bed, finally went away.  Averages migraine about every other month last for about 3 days. Taking topamax  50 mg and does well, increase dose cause lip numbness     HISTORY OF PRESENT ILLNESS:  01/21/24 ALL:  [name] [name] returns for follow up for history of optic neuritis and headaches. She was last seen 01/2022 and doing well. She has continued topiramate  50mg  daily per PCP. Headaches are usually well managed. May have a migraine every other month. She does report an event a couple months ago where she had an intractable migraine that lasted three days and was associated with imbalance/dizziness. No vision changes. No sensory or motor changes. Headache eventually resolved with rest. May take a Tylenol . She has taken estrogen replacement for years. Last eye exam about 6 months ago.   She continues Concerta  most days. Will take a holiday on days she doesn't have to work. Tolerating it well.   01/21/2023 ALL:  [name] [name] returns for follow up for history of optic neuritis and headaches. She was last seen 01/2022 and doing well. She has continued topiramate  25mg  daily per PCP. Headaches are well managed. No recent migrainous headaches. No vision changes.  She saw ophthalmology last week and reports eye exam was unremarkable. Stronger lenses were prescribed. She continues Concerta  daily as needed. Doesn't normally take on weekends. She feels strengths are good. Occasionally feels gait is a little unsteady when really tired. No falls. She lost her dad last week. She was told this week that her role with [employer] is being outsourced. 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 She is holding up fairly well and keeping a positive mindset.   01/20/2022 ALL: [name] [name] returns for follow up for history of optic neuritis. She reports doing well.  She has had more headaches recently. She did have a really bad headache in 10/2021 that was associated with visual disturbances. She talked to her opthalmologic who was not concerned and told her to monitor. She was started on topiramate  25-75mg  daily at bedtime. She has taken 25mg  nightly with no headaches since. No other sensory or motor changes. She continues Concerta . She does not take every day. Last fill 01/10/2022 for #30. She is sleeping well. She continues to help care for her mother in Hospice care and her sister with cancer. Appetite is good. She tries to exercise for about 30 minutes 4 times a week.   07/22/2021 ALL:  [name] [name] is a 53 y.o. female here today for follow up for history of optic neuritis. She has been followed for possible MS. Not on DMT. Last MRI in 2019 negative for demyelinating lesions.   She reports that she has been doing really well until two weeks ago. No new or worsening symptoms but she does state that she has been tired and sluggish for the past two weeks. She has had left eye pain. She feels that gait has been a little off. No falls. She feels it is harder to swallow foods. She reports all of these symptoms usually come and go but do not normally linger for 2 weeks at a time. She does endorse more stress. Her mother is in a SNF for dementia and was recently admitted to Hospice. She was caught off guard and not expecting that conversation.  She does have a good support system. She denies any significant depression.   Concerta  helps with attention and fatigue. She does not take every day. Last refill 06/26/21. PDMP appropriate. She is sleeping well. No changes in bladder or bowel functioning. Intermittent incontinence persists. No vision changes. No new numbness or weakness.   HISTORY (copied from Dr Thom Fleeting previous note)  [name] [name] is a 53 y.o. woman with a history of optic neuritis and who has undergone evaluation for possible multiple sclerosis.   Update  01/04/2021:  She is generally doing well.    No visual problems but she notes mild left eye pain at times (has been off/on since the ON).    She had left optic neuritis in 2009. She also has had urinary incontinence but that is doing well now.  She occasionally has numbness when she gets a massage but not at other times.  Gait is fine.       MRI of the brain and spine 06/23/2018.  There was no evidence of demyelination or myelopathy.  She has mild spinal stenosis at [facility]   ADD is doing well on Concerta  36 mg .  She feels the medciation holds her throughout the day.   She does not have insomnia      She is sleeping 8-9 hours and feels refreshed when she wakes up.   She was diagnosed with ADD as a child but did not receive medications.       Optic neuritis history:  In 2009, she lost vision in her left eye.   She had a black central dot and blurriness around that.    She felt symptoms resolved within an hour but was back the next day.    She had a similar recurrence the next day.    She saw ophthalmology (Dr. Seward Dao) and was diagnosed with optic neuritis.    MRI's of the brain and cervical spine were normal.     She had numbness in her face shortly later.    She was referred to Dr. Morgenlander and then another doctor at [facility].    MS was not confirmed.   She saw Dr. Sulema Endo who felt she probably had MS but did not start a DMT.    She then had an episode of left leg numbness and left foot drop that comes and goes for a day or two and still does a couple times a year.    She also has a lot of fatigue (physical > cognitive).   When this occurs, she is more likely to have an episode involving the left leg.   She has had a couple more episodes of blurry vision but none the past year.   She has had steroids (starting with 200 mg and tapering down over a week or so) about twice a year.   The last time was summer 2018.  Some episodes are associated with her gait worsening and even her speech being off.     She always  feels better for a few months afterwards.     She is on Ritalin  10 mg po bid and that usually helps her fatigue.  She sleeps well most every night.   She denies any major problems with mood.     Studies:  She was initially evaluated by Dr. Dania Dupre and had MRI studies, evoked potential studies CSF analysis (no oligoclonal bands and IgG index was normal) and blood work including NMO, anti-cardiolipin antibody, ANA, Ace, RPR, ESR, B12, Lyme.  The blood work was normal or negative.   MRI of the brain and spine 06/23/2018:  There was no evidence of demyelination or myelopathy.  She has mild spinal stenosis at C5C6   MRIs of the brain dated 05/10/2017 month 08/14/2014 and 05/10/2008 an MRI of the cervical spine from 12/16/2016 and MRI the thoracic spine from 08/16/2008. There were no demyelinating lesions in any of the MRIs   REVIEW OF SYSTEMS: Out of a complete 14 system review of symptoms, the patient complains only of the following symptoms, headaches, intermittent imbalance, and all other reviewed systems are negative.   ALLERGIES: Allergies  Allergen Reactions   Venlafaxine Hives    blisters   Augmentin [Amoxicillin-Pot Clavulanate] Rash    tongue swelling Has patient had a PCN reaction causing immediate rash, facial/tongue/throat swelling, SOB or lightheadedness with hypotension: No Has patient had a PCN reaction causing severe rash involving mucus membranes or skin necrosis: No Has patient had a PCN reaction that required hospitalization: No Has patient had a PCN reaction occurring within the last 10 years: No If all of the above answers are "NO", then may proceed with Cephalosporin use.    Demerol [Meperidine] Swelling and Rash    Tongue swelling   Estrogens Rash    The patient can use patch,No oral   Gabapentin Rash   Stadol [Butorphanol] Swelling and Rash    Tongue swelling     HOME MEDICATIONS: Outpatient Medications Prior to Visit  Medication Sig Dispense Refill   cetirizine  (ZYRTEC) 10 MG tablet Take 10 mg by mouth at bedtime.      doxycycline  (VIBRAMYCIN ) 100 MG capsule Take 1 capsule by mouth once a day after meals For one month, then every other day the next month 30 capsule 3   EPINEPHrine  0.3 mg/0.3 mL IJ SOAJ injection Inject 1 pen (0.3 mg) into the muscle as needed for anaphylaxis. 1 each 2   estradiol  (ESTRACE ) 0.1 MG/GM vaginal cream Place 2 g vaginally 2 (two) times a week. 48 g 3   estradiol  (VIVELLE -DOT) 0.05 MG/24HR patch APPLY 1 PATCH TO SKIN TWICE A WEEK. 24 patch 3   glycerin  adult 2 g suppository Place 1 suppository rectally as needed for constipation. 25 suppository 0   methylphenidate  (CONCERTA ) 36 MG PO CR tablet Take 1 tablet (36 mg total) by mouth daily. 30 tablet 0   metroNIDAZOLE  (METROCREAM ) 0.75 % cream Apply a small amount to face 2 (two) times daily as directed 45 g 2   Prasterone  (INTRAROSA ) 6.5 MG INST INSERT 1 VAGINAL INSERT EVERY DAY AS DIRECTED (Patient taking differently: as needed.) 84 each 2   topiramate  (TOPAMAX ) 25 MG capsule Take 1-3 capsules (25-75 mg total) by mouth at bedtime to prevent migraine headaches (Patient taking differently: Take 25 mg by mouth at bedtime.) 270 capsule 0   estradiol  (ESTRACE ) 0.1 MG/GM vaginal cream Place 2 g vaginally 2 (two) times a week. (Patient not taking: Reported on 01/21/2024) 42.5 g 3   estradiol  (ESTRACE ) 0.1 MG/GM vaginal cream Place 2 g vaginally 2 (two) times a week. (Patient not taking: Reported on 01/21/2024) 42.5 g 3   estradiol  (VIVELLE -DOT) 0.05 MG/24HR patch Place 1 patch (0.05 mg total) onto the skin 2 (two) times a week. (Patient not taking: Reported on 01/21/2024) 8 patch 11   estradiol  (VIVELLE -DOT) 0.075 MG/24HR Place 1 patch onto the skin 2 (two) times a week. (Patient not taking: Reported on 01/21/2024) 8 patch 11   estradiol  (VIVELLE -DOT) 0.075 MG/24HR Place 1  patch onto the skin twice a week. (Patient not taking: Reported on 01/21/2024) 24 patch 3   estradiol  (VIVELLE -DOT) 0.075  MG/24HR Place 1 patch onto the skin 2 (two) times a week. (Patient not taking: Reported on 01/21/2024) 24 patch 3   Ivermectin  (SOOLANTRA ) 1 % CREA Apply thin layer on the skin of face twice a day (Patient not taking: Reported on 01/21/2024) 45 g 5   Ivermectin  1 % CREA Apply a thin layer to face 2 times a day (Patient not taking: Reported on 01/21/2024) 45 g 2   meloxicam  (MOBIC ) 15 MG tablet Take 1 tablet (15 mg total) by mouth daily for 10 days (Patient not taking: Reported on 01/21/2024) 10 tablet 0   nitrofurantoin , macrocrystal-monohydrate, (MACROBID ) 100 MG capsule Take 1 capsule (100 mg total) by mouth every 12 (twelve) hours with food for 5 days (Patient not taking: Reported on 01/21/2024) 10 capsule 0   Prasterone  (INTRAROSA ) 6.5 MG INST Place 1 applicator vaginally daily. (Patient not taking: Reported on 01/21/2024) 28 each 6   spironolactone  (ALDACTONE ) 50 MG tablet Take 1 tablet (50 mg total) by mouth daily. (Patient not taking: Reported on 01/21/2024) 90 tablet 5   topiramate  (TOPAMAX ) 25 MG capsule Take 1 - 3 capsules by moth once daily at bedtime to prevent migraine headaches (Patient not taking: Reported on 01/21/2024) 270 capsule 0   traZODone  (DESYREL ) 50 MG tablet Take 1 to 2 tablets at bedtime as needed (Patient not taking: Reported on 01/21/2024) 60 tablet 0   No facility-administered medications prior to visit.     PAST MEDICAL HISTORY: Past Medical History:  Diagnosis Date   Allergy    Angio-edema    Anxiety    Cancer (HCC)    basal cell skin ca   Depression    Family history of adverse reaction to anesthesia    sister woke up during surgery age 11's for tonsillectomy   Multiple sclerosis (HCC)    Neuromuscular disorder (HCC)    multiple sclerosis   Vision abnormalities      PAST SURGICAL HISTORY: Past Surgical History:  Procedure Laterality Date   ADENOIDECTOMY     adnoidectomy     BUNIONECTOMY Left    foot   CESAREAN SECTION     CHOLECYSTECTOMY     ROBOTIC  ASSISTED LAPAROSCOPIC HYSTERECTOMY AND SALPINGECTOMY N/A 01/19/2018   Procedure: XI ROBOTIC ASSISTED LAPAROSCOPIC HYSTERECTOMY AND BILATERAL SALPINGECTOMY, LEFT OVARIAN CYSTECTOMY;  Surgeon: Alphonso Aschoff, MD;  Location: [facility];  Service: Gynecology;  Laterality: N/A;   wisdom teeth exraction       FAMILY HISTORY: Family History  Problem Relation Age of Onset   Cervical cancer Mother    Dementia Mother    Diabetes Mother    Uterine cancer Mother    Melanoma Father    Irritable bowel syndrome Father    High Cholesterol Father    Ulcerative colitis Sister    Breast cancer Sister    Colon cancer Neg Hx    Esophageal cancer Neg Hx    Rectal cancer Neg Hx    Stomach cancer Neg Hx      SOCIAL HISTORY: Social History   Socioeconomic History   Marital status: Divorced    Spouse name: Not on file   Number of children: 2   Years of education: Not on file   Highest education level: Not on file  Occupational History   Occupation: full time  Tobacco Use   Smoking status: Never   Smokeless tobacco:  Never  Vaping Use   Vaping status: Never Used  Substance and Sexual Activity   Alcohol use: Yes    Alcohol/week: 0.0 standard drinks of alcohol    Comment: rarely   Drug use: No   Sexual activity: Yes  Other Topics Concern   Not on file  Social History Narrative   Lives with son and daughter   Right Handed   Drinks 5 cups caffeine daily   Social Drivers of Corporate investment banker Strain: Not on file  Food Insecurity: Not on file  Transportation Needs: Not on file  Physical Activity: Not on file  Stress: Not on file  Social Connections: Not on file  Intimate Partner Violence: Not on file     PHYSICAL EXAM  Vitals:   01/21/24 1433  BP: 120/85  Pulse: 67  Weight: 152 lb (68.9 kg)  Height: 5\' 4"  (1.626 m)      Body mass index is 26.09 kg/m.  Generalized: Well developed, in no acute distress  Cardiology: normal rate and rhythm, no murmur auscultated   Respiratory: clear to auscultation bilaterally    Neurological examination  Mentation: Alert oriented to time, place, history taking. Follows all commands speech and language fluent Cranial nerve II-XII: Pupils were equal round reactive to light. Extraocular movements were full, visual field were full on confrontational test. Facial sensation and strength were normal. Head turning and shoulder shrug  were normal and symmetric. Motor: The motor testing reveals 5 over 5 strength of all 4 extremities. Good symmetric motor tone is noted throughout.  Sensory: Sensory testing is intact to soft touch on all 4 extremities. No evidence of extinction is noted.  Gait and station: Gait is normal.    DIAGNOSTIC DATA (LABS, IMAGING, TESTING) - I reviewed patient records, labs, notes, testing and imaging myself where available.  Lab Results  Component Value Date   WBC 6.0 07/15/2022   HGB 15.9 (H) 07/15/2022   HCT 45.8 07/15/2022   MCV 87.1 07/15/2022   PLT 380 07/15/2022      Component Value Date/Time   NA 138 07/15/2022 0800   NA 143 10/12/2017 0947   K 3.5 07/15/2022 0800   CL 104 07/15/2022 0800   CO2 25 07/15/2022 0800   GLUCOSE 121 (H) 07/15/2022 0800   BUN 15 07/15/2022 0800   BUN 14 10/12/2017 0947   CREATININE 0.84 07/15/2022 0800   CALCIUM 9.6 07/15/2022 0800   PROT 7.4 07/15/2022 0800   PROT 7.1 10/12/2017 0947   ALBUMIN 4.6 07/15/2022 0800   ALBUMIN 4.6 10/12/2017 0947   AST 13 (L) 07/15/2022 0800   ALT 19 07/15/2022 0800   ALKPHOS 79 07/15/2022 0800   BILITOT 0.8 07/15/2022 0800   BILITOT 0.3 10/12/2017 0947   GFRNONAA >60 07/15/2022 0800   GFRAA >60 03/25/2018 1504   No results found for: "CHOL", "HDL", "LDLCALC", "LDLDIRECT", "TRIG", "CHOLHDL" No results found for: "HGBA1C" No results found for: "VITAMINB12" Lab Results  Component Value Date   TSH 1.810 10/12/2017        No data to display               No data to display           ASSESSMENT AND  PLAN  54 y.o. year old female  has a past medical history of Allergy, Angio-edema, Anxiety, Cancer (HCC), Depression, Family history of adverse reaction to anesthesia, Multiple sclerosis (HCC), Neuromuscular disorder (HCC), and Vision abnormalities. here with  Optic neuritis - Plan: MR BRAIN W WO CONTRAST  Migraine with aura and without status migrainosus, not intractable - Plan: MR BRAIN W WO CONTRAST  Heli reports doing well, today. She does report an headache associated with imbalance lasting about 3 days 2 months ago. She denies new sensory changes or motor weakness. Headaches have, otherwise, remained well managed on topiramate  50mg  daily (PCP). Will use Nurtec for abortive therapy. Avoid triptans d/t hx of optic neuritis and migraine with aura. She was encouraged to continue monitoring symptoms. We will check MRI for eval of any new intracranial concerns.I have given her 8 sample tablets of Nurtec for abortive therapy. I have advised she discuss history of migraines with GYN. Been stable on estrogen for years but aware of increased risk of stroke in women with migraine aura. We will continue Concerta . PDMP shows appropriate refills. Refill sent today. Healthy lifestyle habits encouraged. She will follow up with me in 1 year, sooner if needed.    Orders Placed This Encounter  Procedures   MR BRAIN W WO CONTRAST    Standing Status:   Future    Expiration Date:   01/20/2025    If indicated for the ordered procedure, I authorize the administration of contrast media per Radiology protocol:   Yes    What is the patient's sedation requirement?:   No Sedation    Does the patient have a pacemaker or implanted devices?:   No    Preferred imaging location?:   Internal      Meds ordered this encounter  Medications   Rimegepant Sulfate  (NURTEC) 75 MG TBDP    Sig: Take 1 tablet (75 mg total) by mouth daily as needed (take for abortive therapy of migraine, no more than 1 tablet in 24 hours or 10  per month).    Dispense:  8 tablet    Refill:  11    Supervising Provider:   AHERN, ANTONIA B [id number]     I spent 30 minutes of face-to-face and non-face-to-face time with patient.  This included previsit chart review, lab review, study review, order entry, electronic health record documentation, patient education.    Terrilyn Fick, MSN, FNP-C 01/21/2024, 3:24 PM  [facility] [address] [place], [place] [address] [phone]

## 2024-01-19 NOTE — Patient Instructions (Incomplete)
 Below is our plan:  We will continue topiramate  25-75mg  daily. We will try Nurtec as needed for intractable migraines.  Take 1 tablet daily as needed for intractable migraine. We will order an MRI just for monitoring.   Make sure to discuss migraines with your GYN provider. You have done well so far but I want you to be aware of risks as discussed.   Please make sure you are staying well hydrated. I recommend 50-60 ounces daily. Well balanced diet and regular exercise encouraged. Consistent sleep schedule with 6-8 hours recommended.   Please continue follow up with care team as directed.   Follow up with me in 1 year if doing well, sooner if needed   You may receive a survey regarding today's visit. I encourage you to leave honest feed back as I do use this information to improve patient care. Thank you for seeing me today!   GENERAL HEADACHE INFORMATION:   Natural supplements: Magnesium Oxide or Magnesium Glycinate 500 mg at bed (up to 800 mg daily) Coenzyme Q10 300 mg in AM Vitamin B2- 200 mg twice a day   Add 1 supplement at a time since even natural supplements can have undesirable side effects. You can sometimes buy supplements cheaper (especially Coenzyme Q10) at www.WebmailGuide.co.za or at [facility].  Migraine with aura: There is increased risk for stroke in women with migraine with aura and a contraindication for the combined contraceptive pill for use by women who have migraine with aura. The risk for women with migraine without aura is lower. However other risk factors like smoking are far more likely to increase stroke risk than migraine. There is a recommendation for no smoking and for the use of OCPs without estrogen such as progestogen only pills particularly for women with migraine with aura.[name] [name] People who have migraine headaches with auras may be 3 times more likely to have a stroke caused by a blood clot, compared to migraine patients who don't see auras. Women who take  hormone-replacement therapy may be 30 percent more likely to suffer a clot-based stroke than women not taking medication containing estrogen. Other risk factors like smoking and high blood pressure may be  much more important.    Vitamins and herbs that show potential:   Magnesium: Magnesium (250 mg twice a day or 500 mg at bed) has a relaxant effect on smooth muscles such as blood vessels. Individuals suffering from frequent or daily headache usually have low magnesium levels which can be increase with daily supplementation of 400-750 mg. Three trials found 40-90% average headache reduction  when used as a preventative. Magnesium may help with headaches are aura, the best evidence for magnesium is for migraine with aura is its thought to stop the cortical spreading depression we believe is the pathophysiology of migraine aura.Magnesium also demonstrated the benefit in menstrually related migraine.  Magnesium is part of the messenger system in the serotonin cascade and it is a good muscle relaxant.  It is also useful for constipation which can be a side effect of other medications used to treat migraine. Good sources include nuts, whole grains, and tomatoes. Side Effects: loose stool/diarrhea  Riboflavin (vitamin B 2) 200 mg twice a day. This vitamin assists nerve cells in the production of ATP a principal energy storing molecule.  It is necessary for many chemical reactions in the body.  There have been at least 3 clinical trials of riboflavin using 400 mg per day all of which suggested that migraine frequency can be  decreased.  All 3 trials showed significant improvement in over half of migraine sufferers.  The supplement is found in bread, cereal, milk, meat, and poultry.  Most Americans get more riboflavin than the recommended daily allowance, however riboflavin deficiency is not necessary for the supplements to help prevent headache. Side effects: energizing, green urine   Coenzyme Q10: This is present  in almost all cells in the body and is critical component for the conversion of energy.  Recent studies have shown that a nutritional supplement of CoQ10 can reduce the frequency of migraine attacks by improving the energy production of cells as with riboflavin.  Doses of 150 mg twice a day have been shown to be effective.   Melatonin: Increasing evidence shows correlation between melatonin secretion and headache conditions.  Melatonin supplementation has decreased headache intensity and duration.  It is widely used as a sleep aid.  Sleep is natures way of dealing with migraine.  A dose of 3 mg is recommended to start for headaches including cluster headache. Higher doses up to 15 mg has been reviewed for use in Cluster headache and have been used. The rationale behind using melatonin for cluster is that many theories regarding the cause of Cluster headache center around the disruption of the normal circadian rhythm in the brain.  This helps restore the normal circadian rhythm.   HEADACHE DIET: Foods and beverages which may trigger migraine Note that only 20% of headache patients are food sensitive. You will know if you are food sensitive if you get a headache consistently 20 minutes to 2 hours after eating a certain food. Only cut out a food if it causes headaches, otherwise you might remove foods you enjoy! What matters most for diet is to eat a well balanced healthy diet full of vegetables and low fat protein, and to not miss meals.   Chocolate, other sweets ALL cheeses except cottage and cream cheese Dairy products, yogurt, sour cream, ice cream Liver Meat extracts (Bovril, Marmite, meat tenderizers) Meats or fish which have undergone aging, fermenting, pickling or smoking. These include: Hotdogs,salami,Lox,sausage, mortadellas,smoked salmon, pepperoni, Pickled herring Pods of broad bean (English beans, Chinese pea pods, [place] (fava) beans, lima and navy beans Ripe avocado, ripe banana Yeast  extracts or active yeast preparations such as Brewer's or Fleishman's (commercial bakes goods are permitted) Tomato based foods, pizza (lasagna, etc.)   MSG (monosodium glutamate) is disguised as many things; look for these common aliases: Monopotassium glutamate Autolysed yeast Hydrolysed protein Sodium caseinate "flavorings" "all natural preservatives" Nutrasweet   Avoid all other foods that convincingly provoke headaches.   Resources: The Dizzy Althia Jetty Your Headache Diet, migrainestrong.com  https://zamora-andrews.com/   Caffeine and Migraine For patients that have migraine, caffeine intake more than 3 days per week can lead to dependency and increased migraine frequency. I would recommend cutting back on your caffeine intake as best you can. The recommended amount of caffeine is 200-300 mg daily, although migraine patients may experience dependency at even lower doses. While you may notice an increase in headache temporarily, cutting back will be helpful for headaches in the long run. For more information on caffeine and migraine, visit: https://americanmigrainefoundation.org/resource-library/caffeine-and-migraine/   Headache Prevention Strategies:   1. Maintain a headache diary; learn to identify and avoid triggers.  - This can be a simple note where you log when you had a headache, associated symptoms, and medications used - There are several smartphone apps developed to help track migraines: Migraine Buddy, Migraine Monitor, Curelator N1-Headache App  Common triggers include: Emotional triggers: Emotional/Upset family or friends Emotional/Upset occupation Business reversal/success Anticipation anxiety Crisis-serious Post-crisis periodNew job/position   Physical triggers: Vacation Day Weekend Strenuous Exercise High Altitude Location New Move Menstrual Day Physical Illness Oversleep/Not enough sleep Weather  changes Light: Photophobia or light sesnitivity treatment involves a balance between desensitization and reduction in overly strong input. Use dark polarized glasses outside, but not inside. Avoid bright or fluorescent light, but do not dim environment to the point that going into a normally lit room hurts. Consider FL-41 tint lenses, which reduce the most irritating wavelengths without blocking too much light.  These can be obtained at axonoptics.com or theraspecs.com Foods: see list above.   2. Limit use of acute treatments (over-the-counter medications, triptans, etc.) to no more than 2 days per week or 10 days per month to prevent medication overuse headache (rebound headache).     3. Follow a regular schedule (including weekends and holidays): Don't skip meals. Eat a balanced diet. 8 hours of sleep nightly. Minimize stress. Exercise 30 minutes per day. Being overweight is associated with a 5 times increased risk of chronic migraine. Keep well hydrated and drink 6-8 glasses of water per day.   4. Initiate non-pharmacologic measures at the earliest onset of your headache. Rest and quiet environment. Relax and reduce stress. Breathe2Relax is a free app that can instruct you on    some simple relaxtion and breathing techniques. Http://Dawnbuse.com is a    free website that provides teaching videos on relaxation.  Also, there are  many apps that   can be downloaded for "mindful" relaxation.  An app called YOGA NIDRA will help walk you through mindfulness. Another app called Calm can be downloaded to give you a structured mindfulness guide with daily reminders and skill development. Headspace for guided meditation Mindfulness Based Stress Reduction Online Course: www.palousemindfulness.com Cold compresses.   5. Don't wait!! Take the maximum allowable dosage of prescribed medication at the first sign of migraine.   6. Compliance:  Take prescribed medication regularly as directed and at the first  sign of a migraine.   7. Communicate:  Call your physician when problems arise, especially if your headaches change, increase in frequency/severity, or become associated with neurological symptoms (weakness, numbness, slurred speech, etc.). Proceed to emergency room if you experience new or worsening symptoms or symptoms do not resolve, if you have new neurologic symptoms or if headache is severe, or for any concerning symptom.   8. Headache/pain management therapies: Consider various complementary methods, including medication, behavioral therapy, psychological counselling, biofeedback, massage therapy, acupuncture, dry needling, and other modalities.  Such measures may reduce the need for medications. Counseling for pain management, where patients learn to function and ignore/minimize their pain, seems to work very well.   9. Recommend changing family's attention and focus away from patient's headaches. Instead, emphasize daily activities. If first question of day is 'How are your headaches/Do you have a headache today?', then patient will constantly think about headaches, thus making them worse. Goal is to re-direct attention away from headaches, toward daily activities and other distractions.   10. Helpful Websites: www.AmericanHeadacheSociety.org PatentHood.ch www.headaches.org TightMarket.nl www.achenet.org

## 2024-01-21 ENCOUNTER — Encounter: Payer: Self-pay | Admitting: Family Medicine

## 2024-01-21 ENCOUNTER — Other Ambulatory Visit: Payer: Self-pay

## 2024-01-21 ENCOUNTER — Ambulatory Visit: Payer: Commercial Managed Care - PPO | Admitting: Family Medicine

## 2024-01-21 ENCOUNTER — Other Ambulatory Visit (HOSPITAL_COMMUNITY): Payer: Self-pay

## 2024-01-21 VITALS — BP 120/85 | HR 67 | Ht 64.0 in | Wt 152.0 lb

## 2024-01-21 DIAGNOSIS — H469 Unspecified optic neuritis: Secondary | ICD-10-CM | POA: Diagnosis not present

## 2024-01-21 DIAGNOSIS — G43109 Migraine with aura, not intractable, without status migrainosus: Secondary | ICD-10-CM

## 2024-01-21 MED ORDER — METHYLPHENIDATE HCL ER (OSM) 36 MG PO TBCR
36.0000 mg | EXTENDED_RELEASE_TABLET | Freq: Every day | ORAL | 0 refills | Status: DC
  Filled 2024-01-21 – 2024-01-22 (×2): qty 30, 30d supply, fill #0

## 2024-01-21 MED ORDER — NURTEC 75 MG PO TBDP
75.0000 mg | ORAL_TABLET | Freq: Every day | ORAL | 11 refills | Status: AC | PRN
  Filled 2024-01-21 – 2024-01-22 (×2): qty 8, 30d supply, fill #0
  Filled 2024-08-25: qty 8, 30d supply, fill #1

## 2024-01-21 NOTE — Addendum Note (Signed)
 Addended by: Terrilyn Fick L on: 01/21/2024 03:31 PM   Modules accepted: Orders

## 2024-01-22 ENCOUNTER — Other Ambulatory Visit: Payer: Self-pay

## 2024-01-22 ENCOUNTER — Telehealth: Payer: Self-pay | Admitting: Family Medicine

## 2024-01-22 ENCOUNTER — Other Ambulatory Visit (HOSPITAL_COMMUNITY): Payer: Self-pay

## 2024-01-22 NOTE — Telephone Encounter (Signed)
 sent to GI they obtain [place] [facility] [phone]

## 2024-01-26 ENCOUNTER — Other Ambulatory Visit (HOSPITAL_COMMUNITY): Payer: Self-pay

## 2024-02-08 ENCOUNTER — Other Ambulatory Visit (HOSPITAL_COMMUNITY): Payer: Self-pay

## 2024-02-09 ENCOUNTER — Telehealth: Payer: Self-pay | Admitting: *Deleted

## 2024-02-09 ENCOUNTER — Other Ambulatory Visit (HOSPITAL_COMMUNITY): Payer: Self-pay

## 2024-02-09 ENCOUNTER — Telehealth: Payer: Self-pay

## 2024-02-09 NOTE — Telephone Encounter (Signed)
 Pharmacy Patient Advocate Encounter   Received notification from CoverMyMeds that prior authorization for Nurtec 75MG  dispersible tablets is required/requested.   Insurance verification completed.   The patient is insured through [employer] .   Per test claim: PA required; PA submitted to above mentioned insurance via CoverMyMeds Key/confirmation #/[id number] Status is pending

## 2024-02-09 NOTE — Telephone Encounter (Signed)
 Received fax from [facility] requesting PA for Nurtec via covermymeds. Key: [id number].

## 2024-02-09 NOTE — Telephone Encounter (Signed)
   Insurance is requesting documentation on whether the pt has tried and failed a Triptan in the past. I could not find mention of Triptans in chart or in the active and past history med list-please advise.

## 2024-02-09 NOTE — Telephone Encounter (Signed)
 Amy, any reason she cannot take triptans? I do not see where she has tried/failed triptans

## 2024-02-11 ENCOUNTER — Encounter: Payer: Self-pay | Admitting: Family Medicine

## 2024-02-11 ENCOUNTER — Other Ambulatory Visit (HOSPITAL_COMMUNITY): Payer: Self-pay

## 2024-02-13 ENCOUNTER — Other Ambulatory Visit (HOSPITAL_COMMUNITY): Payer: Self-pay

## 2024-02-15 ENCOUNTER — Other Ambulatory Visit (HOSPITAL_COMMUNITY): Payer: Self-pay

## 2024-02-15 NOTE — Telephone Encounter (Signed)
 Pharmacy Patient Advocate Encounter  Received notification from [employer] that Prior Authorization for Nurtec 75MG  dispersible tablets has been APPROVED from 01/16/2024 to 02/14/2025. Ran test claim, Copay is $0. This test claim was processed through [facility]- copay amounts may vary at other pharmacies due to pharmacy/plan contracts, or as the patient moves through the different stages of their insurance plan.   PA #/Case ID/Reference #: PA Case ID #: [id number]

## 2024-02-18 ENCOUNTER — Other Ambulatory Visit (HOSPITAL_COMMUNITY): Payer: Self-pay

## 2024-02-25 ENCOUNTER — Other Ambulatory Visit: Payer: Self-pay | Admitting: Hematology and Oncology

## 2024-02-25 ENCOUNTER — Other Ambulatory Visit (HOSPITAL_COMMUNITY): Payer: Self-pay

## 2024-02-25 MED ORDER — HYDROCHLOROTHIAZIDE 25 MG PO TABS
25.0000 mg | ORAL_TABLET | Freq: Every day | ORAL | 0 refills | Status: AC
  Filled 2024-02-25: qty 90, 90d supply, fill #0

## 2024-02-29 ENCOUNTER — Other Ambulatory Visit (HOSPITAL_COMMUNITY): Payer: Self-pay

## 2024-03-05 ENCOUNTER — Inpatient Hospital Stay
Admission: RE | Admit: 2024-03-05 | Discharge: 2024-03-05 | Disposition: A | Discharge status: Home / Self Care | Source: Ambulatory Visit | Attending: Family Medicine | Admitting: Family Medicine

## 2024-03-05 DIAGNOSIS — G43109 Migraine with aura, not intractable, without status migrainosus: Secondary | ICD-10-CM

## 2024-03-05 DIAGNOSIS — H469 Unspecified optic neuritis: Secondary | ICD-10-CM

## 2024-03-05 MED ORDER — GADOPICLENOL 0.5 MMOL/ML IV SOLN
7.0000 mL | Freq: Once | INTRAVENOUS | Status: AC | PRN
  Administered 2024-03-05: 7 mL via INTRAVENOUS

## 2024-03-09 ENCOUNTER — Ambulatory Visit: Payer: Self-pay | Admitting: Neurology

## 2024-03-09 NOTE — Progress Notes (Signed)
 Please call and advise the patient that the recent MRI Brain did not show any acute findings, such as a stroke, or mass or blood products. It showed nonspecific microvascular ischemic changes, a benign cyst. No further action is required on this test at this time. Please remind patient to keep any upcoming appointments or tests and to call us  with any interim questions, concerns, problems or updates. Thanks,   Pastor Falling, MD

## 2024-03-21 ENCOUNTER — Other Ambulatory Visit (HOSPITAL_COMMUNITY): Payer: Self-pay

## 2024-03-22 ENCOUNTER — Other Ambulatory Visit (HOSPITAL_COMMUNITY): Payer: Self-pay

## 2024-03-31 ENCOUNTER — Other Ambulatory Visit (HOSPITAL_COMMUNITY): Payer: Self-pay

## 2024-03-31 MED ORDER — FUROSEMIDE 20 MG PO TABS
20.0000 mg | ORAL_TABLET | Freq: Every day | ORAL | 0 refills | Status: AC | PRN
  Filled 2024-03-31: qty 30, 30d supply, fill #0

## 2024-04-13 ENCOUNTER — Other Ambulatory Visit (HOSPITAL_BASED_OUTPATIENT_CLINIC_OR_DEPARTMENT_OTHER): Payer: Self-pay

## 2024-04-13 ENCOUNTER — Other Ambulatory Visit (HOSPITAL_COMMUNITY): Payer: Self-pay

## 2024-04-13 MED ORDER — ESTRADIOL 0.075 MG/24HR TD PTTW
1.0000 | MEDICATED_PATCH | TRANSDERMAL | 5 refills | Status: DC
  Filled 2024-04-13: qty 8, 28d supply, fill #0
  Filled 2024-05-12: qty 8, 28d supply, fill #1

## 2024-05-03 ENCOUNTER — Other Ambulatory Visit (HOSPITAL_COMMUNITY): Payer: Self-pay

## 2024-05-03 ENCOUNTER — Other Ambulatory Visit: Payer: Self-pay | Admitting: Family Medicine

## 2024-05-03 ENCOUNTER — Other Ambulatory Visit: Payer: Self-pay

## 2024-05-03 MED ORDER — METHYLPHENIDATE HCL ER (OSM) 36 MG PO TBCR
36.0000 mg | EXTENDED_RELEASE_TABLET | Freq: Every day | ORAL | 0 refills | Status: DC
  Filled 2024-05-03: qty 30, 30d supply, fill #0

## 2024-05-03 NOTE — Telephone Encounter (Signed)
 Last seen 01/21/24 and next f/u 01/23/25. Last refilled 01/22/24 #30.

## 2024-05-04 ENCOUNTER — Other Ambulatory Visit (HOSPITAL_COMMUNITY): Payer: Self-pay

## 2024-05-12 ENCOUNTER — Other Ambulatory Visit (HOSPITAL_COMMUNITY): Payer: Self-pay

## 2024-05-12 MED ORDER — ESTRADIOL 0.075 MG/24HR TD PTTW
MEDICATED_PATCH | TRANSDERMAL | 4 refills | Status: DC
  Filled 2024-05-12: qty 8, 28d supply, fill #0
  Filled 2024-06-06: qty 8, 28d supply, fill #1
  Filled 2024-07-04: qty 8, 28d supply, fill #2
  Filled 2024-08-01: qty 8, 28d supply, fill #3
  Filled 2024-08-25: qty 8, 28d supply, fill #4

## 2024-06-06 ENCOUNTER — Other Ambulatory Visit (HOSPITAL_COMMUNITY): Payer: Self-pay

## 2024-06-07 ENCOUNTER — Other Ambulatory Visit (HOSPITAL_COMMUNITY): Payer: Self-pay

## 2024-06-28 ENCOUNTER — Other Ambulatory Visit: Payer: Self-pay | Admitting: Family Medicine

## 2024-06-28 ENCOUNTER — Other Ambulatory Visit (HOSPITAL_COMMUNITY): Payer: Self-pay

## 2024-06-28 MED ORDER — METHYLPHENIDATE HCL ER (OSM) 36 MG PO TBCR
36.0000 mg | EXTENDED_RELEASE_TABLET | Freq: Every day | ORAL | 0 refills | Status: AC
  Filled 2024-06-28: qty 30, 30d supply, fill #0

## 2024-06-28 NOTE — Telephone Encounter (Signed)
 LOV 01/21/24  Next OV 01/23/25  Last refill 05/03/24, #30, 0 refills  Please review, thanks!

## 2024-07-04 ENCOUNTER — Other Ambulatory Visit (HOSPITAL_COMMUNITY): Payer: Self-pay

## 2024-07-26 ENCOUNTER — Other Ambulatory Visit (HOSPITAL_COMMUNITY): Payer: Self-pay

## 2024-07-26 MED ORDER — TOPIRAMATE 25 MG PO CPSP
25.0000 mg | ORAL_CAPSULE | Freq: Every evening | ORAL | 0 refills | Status: AC
  Filled 2024-07-26: qty 270, 90d supply, fill #0

## 2024-07-27 ENCOUNTER — Other Ambulatory Visit (HOSPITAL_COMMUNITY): Payer: Self-pay

## 2024-08-01 ENCOUNTER — Other Ambulatory Visit (HOSPITAL_COMMUNITY): Payer: Self-pay

## 2024-08-09 ENCOUNTER — Other Ambulatory Visit: Payer: Self-pay

## 2024-08-09 ENCOUNTER — Other Ambulatory Visit (HOSPITAL_COMMUNITY): Payer: Self-pay

## 2024-08-09 MED ORDER — TRETINOIN 0.025 % EX CREA
TOPICAL_CREAM | Freq: Every evening | CUTANEOUS | 2 refills | Status: AC
  Filled 2024-08-09: qty 45, 30d supply, fill #0

## 2024-08-10 ENCOUNTER — Other Ambulatory Visit (HOSPITAL_COMMUNITY): Payer: Self-pay

## 2024-08-10 ENCOUNTER — Ambulatory Visit: Attending: Surgery

## 2024-08-10 ENCOUNTER — Other Ambulatory Visit: Payer: Self-pay | Admitting: Internal Medicine

## 2024-08-10 DIAGNOSIS — R1084 Generalized abdominal pain: Secondary | ICD-10-CM

## 2024-08-10 DIAGNOSIS — I8393 Asymptomatic varicose veins of bilateral lower extremities: Secondary | ICD-10-CM

## 2024-08-10 NOTE — Progress Notes (Signed)
 Treated pt's BLE spider veins with Asclera 1%. Pt was assessed by MD prior to start of treatment. A total of 2 mL of Asclera 1% was administered with a 27 gauge butterfly needle. Easy access; pt tolerated well. Pt was put in 20-30 mm Hg thigh high compression hose at the end of treatment. She was given post treatment care instructions on handout and verbally. She will call if she has any questions/concerns.

## 2024-08-25 ENCOUNTER — Other Ambulatory Visit (HOSPITAL_COMMUNITY): Payer: Self-pay

## 2024-09-12 ENCOUNTER — Other Ambulatory Visit (HOSPITAL_COMMUNITY): Payer: Self-pay

## 2024-09-12 MED ORDER — ESTRADIOL 0.05 MG/24HR TD PTTW
1.0000 | MEDICATED_PATCH | TRANSDERMAL | 11 refills | Status: AC
  Filled 2024-09-12: qty 8, 28d supply, fill #0

## 2024-09-12 MED ORDER — ESTRADIOL 0.01 % VA CREA
TOPICAL_CREAM | VAGINAL | 3 refills | Status: AC
  Filled 2024-09-12: qty 42.5, 30d supply, fill #0

## 2024-09-13 ENCOUNTER — Other Ambulatory Visit (HOSPITAL_COMMUNITY): Payer: Self-pay

## 2024-09-13 ENCOUNTER — Other Ambulatory Visit: Payer: Self-pay

## 2024-09-14 ENCOUNTER — Other Ambulatory Visit (HOSPITAL_COMMUNITY): Payer: Self-pay

## 2024-09-16 ENCOUNTER — Other Ambulatory Visit (HOSPITAL_COMMUNITY): Payer: Self-pay

## 2024-09-16 MED ORDER — OSELTAMIVIR PHOSPHATE 75 MG PO CAPS
75.0000 mg | ORAL_CAPSULE | Freq: Two times a day (BID) | ORAL | 0 refills | Status: AC
  Filled 2024-09-16: qty 10, 5d supply, fill #0

## 2024-09-26 ENCOUNTER — Other Ambulatory Visit (HOSPITAL_COMMUNITY): Payer: Self-pay

## 2024-09-26 MED ORDER — ESTRADIOL 0.075 MG/24HR TD PTTW
1.0000 | MEDICATED_PATCH | TRANSDERMAL | 4 refills | Status: AC
  Filled 2024-09-26: qty 8, 28d supply, fill #0

## 2025-01-23 ENCOUNTER — Ambulatory Visit: Admitting: Family Medicine
